# Patient Record
Sex: Male | Born: 1950
Health system: Southern US, Community
[De-identification: ages and names within clinical notes are randomized; demographics above are authoritative.]

## PROBLEM LIST (undated history)

## (undated) DIAGNOSIS — Z8639 Personal history of other endocrine, nutritional and metabolic disease: Secondary | ICD-10-CM

## (undated) DIAGNOSIS — K219 Gastro-esophageal reflux disease without esophagitis: Secondary | ICD-10-CM

## (undated) DIAGNOSIS — Z9289 Personal history of other medical treatment: Secondary | ICD-10-CM

## (undated) DIAGNOSIS — R238 Other skin changes: Secondary | ICD-10-CM

## (undated) DIAGNOSIS — E669 Obesity, unspecified: Secondary | ICD-10-CM

## (undated) DIAGNOSIS — G4733 Obstructive sleep apnea (adult) (pediatric): Secondary | ICD-10-CM

## (undated) DIAGNOSIS — M549 Dorsalgia, unspecified: Secondary | ICD-10-CM

## (undated) DIAGNOSIS — E119 Type 2 diabetes mellitus without complications: Secondary | ICD-10-CM

## (undated) DIAGNOSIS — R7303 Prediabetes: Secondary | ICD-10-CM

## (undated) DIAGNOSIS — N200 Calculus of kidney: Secondary | ICD-10-CM

## (undated) DIAGNOSIS — L853 Xerosis cutis: Secondary | ICD-10-CM

## (undated) DIAGNOSIS — I10 Essential (primary) hypertension: Secondary | ICD-10-CM

## (undated) DIAGNOSIS — E785 Hyperlipidemia, unspecified: Secondary | ICD-10-CM

## (undated) DIAGNOSIS — R233 Spontaneous ecchymoses: Secondary | ICD-10-CM

## (undated) DIAGNOSIS — R600 Localized edema: Secondary | ICD-10-CM

## (undated) DIAGNOSIS — R1310 Dysphagia, unspecified: Secondary | ICD-10-CM

## (undated) DIAGNOSIS — Z87442 Personal history of urinary calculi: Secondary | ICD-10-CM

## (undated) DIAGNOSIS — M2550 Pain in unspecified joint: Secondary | ICD-10-CM

## (undated) DIAGNOSIS — M5030 Other cervical disc degeneration, unspecified cervical region: Secondary | ICD-10-CM

## (undated) DIAGNOSIS — R0602 Shortness of breath: Secondary | ICD-10-CM

## (undated) DIAGNOSIS — J9811 Atelectasis: Secondary | ICD-10-CM

## (undated) DIAGNOSIS — M5020 Other cervical disc displacement, unspecified cervical region: Secondary | ICD-10-CM

## (undated) DIAGNOSIS — M189 Osteoarthritis of first carpometacarpal joint, unspecified: Secondary | ICD-10-CM

## (undated) HISTORY — DX: Obesity, unspecified: E66.9

## (undated) HISTORY — DX: Prediabetes: R73.03

## (undated) HISTORY — DX: Gastro-esophageal reflux disease without esophagitis: K21.9

## (undated) HISTORY — DX: Hyperlipidemia, unspecified: E78.5

## (undated) HISTORY — DX: Other cervical disc displacement, unspecified cervical region: M50.20

## (undated) HISTORY — DX: Dysphagia, unspecified: R13.10

## (undated) HISTORY — DX: Other cervical disc degeneration, unspecified cervical region: M50.30

## (undated) HISTORY — DX: Atelectasis: J98.11

## (undated) HISTORY — DX: Dorsalgia, unspecified: M54.9

## (undated) HISTORY — DX: Other skin changes: R23.8

## (undated) HISTORY — DX: Localized edema: R60.0

## (undated) HISTORY — DX: Personal history of other endocrine, nutritional and metabolic disease: Z86.39

## (undated) HISTORY — DX: Pain in unspecified joint: M25.50

## (undated) HISTORY — DX: Essential (primary) hypertension: I10

## (undated) HISTORY — DX: Personal history of other medical treatment: Z92.89

## (undated) HISTORY — DX: Osteoarthritis of first carpometacarpal joint, unspecified: M18.9

## (undated) HISTORY — DX: Obstructive sleep apnea (adult) (pediatric): G47.33

## (undated) HISTORY — DX: Shortness of breath: R06.02

---

## 1978-09-27 HISTORY — PX: ORIF ANKLE FRACTURE: SUR919

## 2000-05-13 ENCOUNTER — Encounter: Payer: Self-pay | Admitting: Family Medicine

## 2000-05-13 LAB — CONVERTED CEMR LAB: Blood Glucose, Fasting: 97 mg/dL

## 2000-05-23 DIAGNOSIS — Z9289 Personal history of other medical treatment: Secondary | ICD-10-CM

## 2000-05-23 HISTORY — DX: Personal history of other medical treatment: Z92.89

## 2000-07-18 ENCOUNTER — Ambulatory Visit (HOSPITAL_BASED_OUTPATIENT_CLINIC_OR_DEPARTMENT_OTHER): Admission: RE | Admit: 2000-07-18 | Discharge: 2000-07-18 | Payer: Self-pay | Admitting: Pulmonary Disease

## 2000-10-06 ENCOUNTER — Ambulatory Visit (HOSPITAL_COMMUNITY): Admission: RE | Admit: 2000-10-06 | Discharge: 2000-10-06 | Payer: Self-pay | Admitting: *Deleted

## 2003-09-28 DIAGNOSIS — E785 Hyperlipidemia, unspecified: Secondary | ICD-10-CM

## 2003-09-28 DIAGNOSIS — K219 Gastro-esophageal reflux disease without esophagitis: Secondary | ICD-10-CM

## 2003-09-28 HISTORY — PX: SEPTOPLASTY: SUR1290

## 2003-09-28 HISTORY — DX: Gastro-esophageal reflux disease without esophagitis: K21.9

## 2003-09-28 HISTORY — DX: Hyperlipidemia, unspecified: E78.5

## 2003-10-10 ENCOUNTER — Encounter: Payer: Self-pay | Admitting: Family Medicine

## 2003-10-10 LAB — CONVERTED CEMR LAB
Blood Glucose, Fasting: 110 mg/dL
PSA: 1.3 ng/mL
TSH: 1.64 microintl units/mL

## 2003-10-28 ENCOUNTER — Encounter: Admission: RE | Admit: 2003-10-28 | Discharge: 2003-10-28 | Payer: Self-pay | Admitting: Family Medicine

## 2004-06-27 DIAGNOSIS — I10 Essential (primary) hypertension: Secondary | ICD-10-CM

## 2004-06-27 HISTORY — DX: Essential (primary) hypertension: I10

## 2004-08-18 ENCOUNTER — Ambulatory Visit: Payer: Self-pay | Admitting: Pulmonary Disease

## 2004-10-13 ENCOUNTER — Ambulatory Visit: Payer: Self-pay | Admitting: Family Medicine

## 2004-10-13 LAB — CONVERTED CEMR LAB
Blood Glucose, Fasting: 115 mg/dL
PSA: 0.74 ng/mL

## 2004-10-16 ENCOUNTER — Ambulatory Visit: Payer: Self-pay | Admitting: Family Medicine

## 2004-11-12 ENCOUNTER — Ambulatory Visit: Payer: Self-pay | Admitting: Family Medicine

## 2005-08-13 ENCOUNTER — Ambulatory Visit: Payer: Self-pay | Admitting: Family Medicine

## 2005-12-07 ENCOUNTER — Ambulatory Visit: Payer: Self-pay | Admitting: Family Medicine

## 2005-12-07 LAB — CONVERTED CEMR LAB: Blood Glucose, Fasting: 115 mg/dL

## 2005-12-13 ENCOUNTER — Ambulatory Visit: Payer: Self-pay | Admitting: Family Medicine

## 2006-01-19 ENCOUNTER — Ambulatory Visit: Payer: Self-pay | Admitting: Family Medicine

## 2006-01-20 LAB — FECAL OCCULT BLOOD, GUAIAC: Fecal Occult Blood: NEGATIVE

## 2007-06-01 ENCOUNTER — Telehealth (INDEPENDENT_AMBULATORY_CARE_PROVIDER_SITE_OTHER): Payer: Self-pay | Admitting: *Deleted

## 2008-02-07 ENCOUNTER — Telehealth: Payer: Self-pay | Admitting: Family Medicine

## 2008-02-12 ENCOUNTER — Ambulatory Visit: Payer: Self-pay | Admitting: Family Medicine

## 2008-02-12 DIAGNOSIS — I10 Essential (primary) hypertension: Secondary | ICD-10-CM | POA: Insufficient documentation

## 2008-02-12 DIAGNOSIS — G4730 Sleep apnea, unspecified: Secondary | ICD-10-CM

## 2008-07-04 ENCOUNTER — Ambulatory Visit: Payer: Self-pay | Admitting: Family Medicine

## 2008-07-04 LAB — CONVERTED CEMR LAB
Alkaline Phosphatase: 61 units/L (ref 39–117)
Bilirubin, Direct: 0.1 mg/dL (ref 0.0–0.3)
Calcium: 9 mg/dL (ref 8.4–10.5)
GFR calc Af Amer: 129 mL/min
GFR calc non Af Amer: 106 mL/min
Glucose, Bld: 90 mg/dL (ref 70–99)
HDL: 41 mg/dL (ref >39.0)
LDL Cholesterol: 127 mg/dL — ABNORMAL HIGH (ref 0–99)
Potassium: 4.2 meq/L (ref 3.5–5.1)
Sodium: 139 meq/L (ref 135–145)
TSH: 1.34 microintl units/mL (ref 0.35–5.50)
Total Bilirubin: 1.2 mg/dL (ref 0.3–1.2)
Total CHOL/HDL Ratio: 4.4
VLDL: 13 mg/dL (ref 0–40)

## 2008-07-10 ENCOUNTER — Ambulatory Visit: Payer: Self-pay | Admitting: Family Medicine

## 2008-07-10 ENCOUNTER — Encounter: Payer: Self-pay | Admitting: Family Medicine

## 2008-07-10 DIAGNOSIS — E118 Type 2 diabetes mellitus with unspecified complications: Secondary | ICD-10-CM

## 2008-07-10 DIAGNOSIS — E1165 Type 2 diabetes mellitus with hyperglycemia: Secondary | ICD-10-CM

## 2008-07-10 DIAGNOSIS — E785 Hyperlipidemia, unspecified: Secondary | ICD-10-CM

## 2008-07-10 DIAGNOSIS — Z87442 Personal history of urinary calculi: Secondary | ICD-10-CM

## 2008-07-10 DIAGNOSIS — K219 Gastro-esophageal reflux disease without esophagitis: Secondary | ICD-10-CM

## 2008-07-10 HISTORY — DX: Gastro-esophageal reflux disease without esophagitis: K21.9

## 2009-02-17 ENCOUNTER — Encounter: Payer: Self-pay | Admitting: Family Medicine

## 2010-04-30 ENCOUNTER — Encounter (INDEPENDENT_AMBULATORY_CARE_PROVIDER_SITE_OTHER): Payer: Self-pay | Admitting: *Deleted

## 2010-10-27 NOTE — Letter (Signed)
Summary: Nadara Eaton letter  [place] at [facility]  [address] [place], [place] [address]   Phone: [phone]  Fax: [phone]       04/30/2010 MRN: [id number]  Cordel Zhang [address] [place], [place]  [address]  Dear Mr. Lawerance Bach,  [facility], and [facility] announce the retirement of Arta Silence, M.D., from full-time practice at the [facility] office effective March 26, 2010 and his plans of returning part-time.  It is important to Dr. Hetty Ely and to our practice that you understand that [facility] has seven physicians in our office for your health care needs.  We will continue to offer the same exceptional care that you have today.    Dr. Hetty Ely has spoken to many of you about his plans for retirement and returning part-time in the fall.   We will continue to work with you through the transition to schedule appointments for you in the office and meet the high standards that [facility] is committed to.   Again, it is with great pleasure that we share the news that Dr. Hetty Ely will return to [facility] at [facility] in October of 2011 with a reduced schedule.    If you have any questions, or would like to request an appointment with one of our physicians, please call us at [phone] and press the option for Scheduling an appointment.  We take pleasure in providing you with excellent patient care and look forward to seeing you at your next office visit.  Our [facility] Physicians are:  Tillman Abide, M.D. Laurita Quint, M.D. Roxy Manns, M.D. Kerby Nora, M.D. Hannah Beat, M.D. Ruthe Mannan, M.D. We proudly welcomed Raechel Ache, M.D. and Eustaquio Boyden, M.D. to the practice in July/August 2011.  Sincerely,  [facility]

## 2011-02-12 NOTE — Op Note (Signed)
[place]. [facility]  Patient:    Roger Mcdaniel, Roger Mcdaniel                        MRN: [id number] Proc. Date: 10/06/00 Adm. Date:  [id number] Attending:  Carlena Sax CC:         Barbaraann Share, M.D. [facility]  [facility]   Operative Report  PREOPERATIVE DIAGNOSIS:  Obstructive sleep apnea and nasal airway obstruction. Nasal septal deviation, inferior turbinate hypertrophy.  POSTOPERATIVE DIAGNOSIS:  Obstructive sleep apnea and nasal airway obstruction. Nasal septal deviation, inferior turbinate hypertrophy.  OPERATION PERFORMED:  Nasal septal reconstruction and intermural cauterization of both inferior turbinates.  SURGEON:  Veverly Fells. Arletha Grippe, M.D.  ANESTHESIA:  General endotracheal.  INDICATIONS FOR PROCEDURE:  The patient is a 60 year old white male with a long history of nasal airway obstruction, persistent mouth breathing, loud snoring at night with apneic episodes as documented by his family.  He does have a history of daytime somnolence and morning fatigue.  Inpatient polysomnogram did reveal mild obstructive sleep apnea with an RD of ____________ with desaturations down to 79%.  Physical examination did show a significant septal deviation to the right, congestion of the inferior turbinates bilaterally.  Based on his history and physical examination, I have recommended proceeding with the above noted surgical procedure.  I have discussed extensively with him the risks and benefits of the procedure including the risks of general anesthesia, infection, and bleeding and the normal recovery period after this type of surgery.  I have entertained any questions, answered them appropriately, informed consent has been obtained. The patient presents for the above noted procedure.  OPERATIVE FINDINGS:  Septal deviation, right inferior turbinate congestion, hypertrophy.  DESCRIPTION F PROCEDURE:  The patient was brought to the operating room and placed in the  supine position.  General endotracheal anesthesia was administered via the anesthesiologist without complication.  The patient was administered 1 gm Ancef IV x 1, 10 mg of Decadron IV x 1.  The head of the table was elevated to 30 degrees.  The patients face was draped in standard fashion.  Cotton pledgets soaked in a 4% cocaine solution and placed in both nasal chambers and left in place for approximately 5 to 10 minutes and then removed.  Both sides of the septum were infiltrated with 1% lidocaine solution and 1:100,000 epinephrine.  After waiting approximately 10 minutes, the standard Killian incision was made on the left side of the septum.  The mucoperichondrial and mucoperiosteal flap was elevated on the left side using both blunt and sharp dissection.  The intercartilaginous incision was made approximately 1 cm posterior from the caudal end of the septum.  The mucoperichondrial and mucoperiosteal flap was elevated on the right side using both blunt and sharp dissection.  Cartilaginous deviation was removed with a Ballenger swivel knive.  The posterior bony deflection was removed with an open Jansen-Middleton forceps and the septal spur which was pushing off the right nasal chamber was also removed using a Jansen-Middleton forceps without difficulty.  A piece of trimmed morselized cartilage was placed in between the septal flap, septal incision was closed with interrupted chromic suture and septum was reinforced with a running transeptal plain but mattress stitch. Both inferior turbinates were injected with a total of 6 cc of a 1% lidocaine solution with 1:100,000 epinephrine.  Both inferior turbinates were intramurally cauterized using the Elmed intramural cauterization unit set on a 12 watt setting. Three passes  of both inferior turbinates were performed without difficulty and both inferior turbinates were outfractured using gentle lateral pressure with a large nasal speculum.  Doyle  splint soaked in a Bactroban ointment solution placed on either side of the septum and held in place with a transeptal Prolene suture.  The orogastric tube was placed.  This was used to decompress the stomach contents.  This was then removed without incident.  Fluids given during the procedure were approximately 800 cc of crystalloid.  Estimated blood loss for this procedure was less than 30 cc. Urine output was not measured.  No drains.  The above noted two Doyle splints were placed.  Specimens sent were septal cartilage and bone for gross pathology only.  The patient tolerated the procedure well without complications, was extubated in the operating room and transferred to recovery room in stable condition.  Sponge, needle and instrument counts were correct at the end of the procedure.  Total duration of the procedure was approximately one and one half hours.  The patient will be admitted for overnight recovery.  Once he is recovered well, he will be sent home on October 07, 2000.  He will be sent home on Keflex 500 mg p.o. t.i.d. for 10 days and Vicodin #30 with refills, 1 to 2 tabs p.o. q.4h. p.r.n. pain.  He is to have light activity, no heavy lifting or nose blowing for two weeks after surgery.  Both he and his family were given oral and written instructions. They are to call for any signs of bleeding, fever, vomiting, pain or reaction to medications or any other questions.  He will follow up in the office for splint removal on Thursday January 17 at 2:25 p.m. DD:  10/06/00 TD:  10/06/00 Job: [id number]

## 2011-04-03 ENCOUNTER — Encounter: Payer: Self-pay | Admitting: Family Medicine

## 2011-04-20 ENCOUNTER — Other Ambulatory Visit: Payer: Self-pay | Admitting: Family Medicine

## 2011-04-20 DIAGNOSIS — R7309 Other abnormal glucose: Secondary | ICD-10-CM

## 2011-04-20 DIAGNOSIS — K219 Gastro-esophageal reflux disease without esophagitis: Secondary | ICD-10-CM

## 2011-04-20 DIAGNOSIS — I10 Essential (primary) hypertension: Secondary | ICD-10-CM

## 2011-04-20 DIAGNOSIS — E785 Hyperlipidemia, unspecified: Secondary | ICD-10-CM

## 2011-04-20 DIAGNOSIS — Z125 Encounter for screening for malignant neoplasm of prostate: Secondary | ICD-10-CM

## 2011-04-27 ENCOUNTER — Other Ambulatory Visit (INDEPENDENT_AMBULATORY_CARE_PROVIDER_SITE_OTHER): Payer: 59 | Admitting: Family Medicine

## 2011-04-27 ENCOUNTER — Telehealth: Payer: Self-pay | Admitting: *Deleted

## 2011-04-27 DIAGNOSIS — E785 Hyperlipidemia, unspecified: Secondary | ICD-10-CM

## 2011-04-27 DIAGNOSIS — K219 Gastro-esophageal reflux disease without esophagitis: Secondary | ICD-10-CM

## 2011-04-27 DIAGNOSIS — R7309 Other abnormal glucose: Secondary | ICD-10-CM

## 2011-04-27 DIAGNOSIS — Z125 Encounter for screening for malignant neoplasm of prostate: Secondary | ICD-10-CM

## 2011-04-27 LAB — HEPATIC FUNCTION PANEL
ALT: 23 U/L (ref 0–53)
AST: 22 U/L (ref 0–37)
Total Bilirubin: 1 mg/dL (ref 0.3–1.2)
Total Protein: 7 g/dL (ref 6.0–8.3)

## 2011-04-27 LAB — RENAL FUNCTION PANEL
BUN: 17 mg/dL (ref 6–23)
CO2: 28 mEq/L (ref 19–32)
Calcium: 9.1 mg/dL (ref 8.4–10.5)
Creatinine, Ser: 0.8 mg/dL (ref 0.4–1.5)
GFR: 102.26 mL/min (ref >60.00)
Glucose, Bld: 104 mg/dL — ABNORMAL HIGH (ref 70–99)
Sodium: 139 mEq/L (ref 135–145)

## 2011-04-27 LAB — LDL CHOLESTEROL, DIRECT: Direct LDL: 143.5 mg/dL

## 2011-04-27 LAB — LIPID PANEL
HDL: 42.3 mg/dL (ref >39.00)
Triglycerides: 63 mg/dL (ref 0.0–149.0)

## 2011-04-27 LAB — VITAMIN B12: Vitamin B-12: 440 pg/mL (ref 211–911)

## 2011-04-27 LAB — TSH: TSH: 2.36 u[IU]/mL (ref 0.35–5.50)

## 2011-04-27 LAB — PSA: PSA: 1.34 ng/mL (ref 0.10–4.00)

## 2011-04-27 NOTE — Telephone Encounter (Signed)
Roger Mcdaniel can this be added?

## 2011-04-27 NOTE — Telephone Encounter (Signed)
Patient came in this morning for labs and wanted to see a nurse about a bruise that he has on his arm. He says that his arm has been bruising very easily for the past several months. He says that he is not taking blood thinner, aspirin. He has cpx next week. I told him to bring it to your attention when he comes in.

## 2011-04-27 NOTE — Telephone Encounter (Signed)
Can Roger Mcdaniel add CBC to labs drawn with bruising as diagnosis, please?

## 2011-04-28 ENCOUNTER — Telehealth: Payer: Self-pay | Admitting: Family Medicine

## 2011-04-28 ENCOUNTER — Other Ambulatory Visit: Payer: Self-pay | Admitting: Family Medicine

## 2011-04-28 DIAGNOSIS — S5010XA Contusion of unspecified forearm, initial encounter: Secondary | ICD-10-CM

## 2011-04-28 NOTE — Telephone Encounter (Signed)
Left message on voice mail  to call back

## 2011-04-28 NOTE — Telephone Encounter (Signed)
Message copied by Arta Silence on Wed Apr 28, 2011  7:26 AM ------      Message from: Alvina Chou      Created: Tue Apr 27, 2011  5:16 PM       Cannot add CBC, wrong specimen type. Sorry , Camelia Eng

## 2011-04-28 NOTE — Telephone Encounter (Signed)
Patient notified and lab appointment scheduled. 

## 2011-04-28 NOTE — Telephone Encounter (Signed)
Rene Kocher, Would you pls have this pt come in for CBC prior to seeing him next week. He complains of easy bruising. Pls use code 923.10

## 2011-05-03 ENCOUNTER — Other Ambulatory Visit (INDEPENDENT_AMBULATORY_CARE_PROVIDER_SITE_OTHER): Payer: 59 | Admitting: Family Medicine

## 2011-05-03 DIAGNOSIS — S5010XA Contusion of unspecified forearm, initial encounter: Secondary | ICD-10-CM

## 2011-05-03 LAB — CBC WITH DIFFERENTIAL/PLATELET
Basophils Relative: 0.6 % (ref 0.0–3.0)
HCT: 45.2 % (ref 39.0–52.0)
Hemoglobin: 15.2 g/dL (ref 13.0–17.0)
Lymphocytes Relative: 29.7 % (ref 12.0–46.0)
Lymphs Abs: 1.8 10*3/uL (ref 0.7–4.0)
Monocytes Relative: 10.6 % (ref 3.0–12.0)
Neutro Abs: 3.5 10*3/uL (ref 1.4–7.7)
RBC: 4.9 Mil/uL (ref 4.22–5.81)

## 2011-05-05 ENCOUNTER — Ambulatory Visit (INDEPENDENT_AMBULATORY_CARE_PROVIDER_SITE_OTHER): Payer: 59 | Admitting: Family Medicine

## 2011-05-05 ENCOUNTER — Encounter: Payer: Self-pay | Admitting: Family Medicine

## 2011-05-05 VITALS — BP 118/70 | HR 68 | Temp 98.4°F | Ht 64.75 in | Wt 237.2 lb

## 2011-05-05 DIAGNOSIS — Z23 Encounter for immunization: Secondary | ICD-10-CM

## 2011-05-05 DIAGNOSIS — I10 Essential (primary) hypertension: Secondary | ICD-10-CM

## 2011-05-05 DIAGNOSIS — E785 Hyperlipidemia, unspecified: Secondary | ICD-10-CM

## 2011-05-05 DIAGNOSIS — R7309 Other abnormal glucose: Secondary | ICD-10-CM

## 2011-05-05 DIAGNOSIS — K219 Gastro-esophageal reflux disease without esophagitis: Secondary | ICD-10-CM

## 2011-05-05 MED ORDER — PRAVASTATIN SODIUM 40 MG PO TABS: 40.0000 mg | ORAL_TABLET | Freq: Every evening | ORAL | Status: DC

## 2011-05-05 NOTE — Assessment & Plan Note (Addendum)
Good control. Cont curr lifestyle. On no meds. BP Readings from Last 3 Encounters:  05/05/11 118/70  07/10/08 120/60  01/30/99 130/86

## 2011-05-05 NOTE — Patient Instructions (Signed)
SGOT, SGPT 272.4    6 WEEKS See me in 3 mos, SGOT, SGPT, CHOL PROFILE  272.4     prior

## 2011-05-05 NOTE — Assessment & Plan Note (Signed)
All good except LDL high. Will start on Prava 40mg  and reassess in 3 months. Lab Results  Component Value Date   CHOL 201* 04/27/2011   CHOL 181 07/04/2008   Lab Results  Component Value Date   HDL 42.30 04/27/2011   HDL 78.2 07/04/2008   Lab Results  Component Value Date   LDLCALC 127* 07/04/2008   Lab Results  Component Value Date   TRIG 63.0 04/27/2011   TRIG 63 07/04/2008   Lab Results  Component Value Date   CHOLHDL 5 04/27/2011   CHOLHDL 4.4 CALC 07/04/2008   Lab Results  Component Value Date   LDLDIRECT 143.5 04/27/2011

## 2011-05-05 NOTE — Assessment & Plan Note (Addendum)
Still mildly elevated. Watch sweets and carbs. Get LDL lower. Microalb nml.

## 2011-05-05 NOTE — Progress Notes (Signed)
  Subjective:    Patient ID: Roger Mcdaniel, male    DOB: 27-Aug-1951, 60 y.o.   MRN: [id number]  HPI Pt here for Comp Exam. He had a significant bruise last week when blood was drawn and I did a CBC to check. He hit something prior. He has chronic wrist pain and takes Naprosyn 2 a day. He was cautioned to take with food. He also takes vinegar and honey and grapefruit juice three times a day also. We discussed poss interactions of the juice.     Review of Systems  Constitutional: Negative for fever, chills, diaphoresis, appetite change, fatigue and unexpected weight change.  HENT: Negative for hearing loss, ear pain, tinnitus and ear discharge.   Eyes: Negative for pain, discharge and visual disturbance.  Respiratory: Negative for cough, shortness of breath and wheezing.        He is no longer on CPAP.  Cardiovascular: Negative for chest pain and palpitations.       No SOB w/ exertion  Gastrointestinal: Negative for nausea, vomiting, abdominal pain, diarrhea, constipation and blood in stool.       No heartburn or swallowing problems.  Genitourinary: Negative for dysuria, frequency and difficulty urinating.       Has nocturia once a night.   Musculoskeletal: Positive for arthralgias (wrist pain bilat, chronically.). Negative for myalgias and back pain.  Skin: Negative for rash.       No itching or dryness.  Neurological: Negative for tremors and numbness.       No tingling or balance problems. Mild numbness of left lateral thigh.  Hematological: Negative for adenopathy. Does not bruise/bleed easily.       Has bruising with forearm trauma.  Psychiatric/Behavioral: Negative for dysphoric mood and agitation.       Objective:   Physical Exam  Constitutional: He is oriented to person, place, and time. He appears well-developed and well-nourished. No distress.  HENT:  Head: Normocephalic and atraumatic.  Right Ear: External ear normal.  Left Ear: External ear normal.  Nose: Nose normal.    Mouth/Throat: Oropharynx is clear and moist.  Eyes: Conjunctivae and EOM are normal. Pupils are equal, round, and reactive to light. Right eye exhibits no discharge. Left eye exhibits no discharge. No scleral icterus.  Neck: Normal range of motion. Neck supple. No thyromegaly present.  Cardiovascular: Normal rate, regular rhythm, normal heart sounds and intact distal pulses.   No murmur heard. Pulmonary/Chest: Effort normal and breath sounds normal. No respiratory distress. He has no wheezes.  Abdominal: Soft. Bowel sounds are normal. He exhibits no distension and no mass. There is no tenderness. There is no rebound and no guarding.  Genitourinary: Rectum normal, prostate normal and penis normal. Guaiac negative stool.       Prostate 20-30gms, smooth, symmetric and firm, raphe intact  Musculoskeletal: Normal range of motion. He exhibits no edema.  Lymphadenopathy:    He has no cervical adenopathy.  Neurological: He is alert and oriented to person, place, and time. Coordination normal.  Skin: Skin is warm and dry. No rash noted. He is not diaphoretic.  Psychiatric: He has a normal mood and affect. His behavior is normal. Judgment and thought content normal.          Assessment & Plan:  HMPE  Tdap today.

## 2011-05-05 NOTE — Assessment & Plan Note (Signed)
Stable and well controlled.. B12 ok.

## 2011-05-19 ENCOUNTER — Other Ambulatory Visit: Payer: 59

## 2011-05-19 ENCOUNTER — Other Ambulatory Visit: Payer: Self-pay | Admitting: Family Medicine

## 2011-05-19 DIAGNOSIS — Z0000 Encounter for general adult medical examination without abnormal findings: Secondary | ICD-10-CM

## 2011-05-20 ENCOUNTER — Encounter: Payer: Self-pay | Admitting: *Deleted

## 2011-05-20 ENCOUNTER — Encounter: Payer: Self-pay | Admitting: Family Medicine

## 2011-06-14 ENCOUNTER — Other Ambulatory Visit (INDEPENDENT_AMBULATORY_CARE_PROVIDER_SITE_OTHER): Payer: 59

## 2011-06-14 DIAGNOSIS — E7800 Pure hypercholesterolemia, unspecified: Secondary | ICD-10-CM

## 2011-06-14 LAB — AST: AST: 23 U/L (ref 0–37)

## 2011-06-14 LAB — ALT: ALT: 22 U/L (ref 0–53)

## 2011-07-22 ENCOUNTER — Other Ambulatory Visit: Payer: Self-pay | Admitting: Family Medicine

## 2011-07-22 DIAGNOSIS — E785 Hyperlipidemia, unspecified: Secondary | ICD-10-CM

## 2011-07-26 ENCOUNTER — Other Ambulatory Visit (INDEPENDENT_AMBULATORY_CARE_PROVIDER_SITE_OTHER): Payer: 59

## 2011-07-26 DIAGNOSIS — E785 Hyperlipidemia, unspecified: Secondary | ICD-10-CM

## 2011-07-26 LAB — LIPID PANEL
Cholesterol: 125 mg/dL (ref 0–200)
HDL: 48 mg/dL (ref >39.00)
Triglycerides: 41 mg/dL (ref 0.0–149.0)
VLDL: 8.2 mg/dL (ref 0.0–40.0)

## 2011-07-26 LAB — HEPATIC FUNCTION PANEL
ALT: 25 U/L (ref 0–53)
AST: 26 U/L (ref 0–37)
Albumin: 4.2 g/dL (ref 3.5–5.2)
Total Bilirubin: 0.6 mg/dL (ref 0.3–1.2)
Total Protein: 7 g/dL (ref 6.0–8.3)

## 2011-07-28 ENCOUNTER — Ambulatory Visit (INDEPENDENT_AMBULATORY_CARE_PROVIDER_SITE_OTHER): Payer: 59 | Admitting: Family Medicine

## 2011-07-28 ENCOUNTER — Encounter: Payer: Self-pay | Admitting: Family Medicine

## 2011-07-28 DIAGNOSIS — E785 Hyperlipidemia, unspecified: Secondary | ICD-10-CM

## 2011-07-28 DIAGNOSIS — I10 Essential (primary) hypertension: Secondary | ICD-10-CM

## 2011-07-28 NOTE — Progress Notes (Signed)
  Subjective:    Patient ID: Roger Mcdaniel, male    DOB: 23-Oct-1950, 60 y.o.   MRN: [id number]  HPI Pt here for three month followup after starting Pravachol 40 mg for better chol control.  His BP is high today and he has not missed any medications. He denies changes in diet, spcifically salt intake.  He used to be on beta blocker in the past but was taken off after he had lost a fair amount of weight. He is again trying to lose weight.   Review of Systems  Constitutional: Negative for fever, chills, diaphoresis, activity change, appetite change and fatigue.  HENT: Negative for hearing loss, ear pain, congestion, sore throat, rhinorrhea, neck pain, neck stiffness, postnasal drip, sinus pressure, tinnitus and ear discharge.   Eyes: Negative for pain, discharge and visual disturbance.  Respiratory: Negative for cough, shortness of breath and wheezing.   Cardiovascular: Negative for chest pain and palpitations.       No SOB w/ exertion  Gastrointestinal:       No heartburn or swallowing problems.  Genitourinary:       No nocturia  Skin:       No itching or dryness.  Neurological:       No tingling or balance problems.  All other systems reviewed and are negative.       Objective:   Physical Exam  Constitutional: He appears well-developed and well-nourished. No distress.  HENT:  Head: Normocephalic and atraumatic.  Right Ear: External ear normal.  Left Ear: External ear normal.  Nose: Nose normal.  Mouth/Throat: Oropharynx is clear and moist.  Eyes: Conjunctivae and EOM are normal. Pupils are equal, round, and reactive to light. Right eye exhibits no discharge. Left eye exhibits no discharge.  Neck: Normal range of motion. Neck supple.  Cardiovascular: Normal rate and regular rhythm.   Pulmonary/Chest: Effort normal and breath sounds normal. He has no wheezes.  Lymphadenopathy:    He has no cervical adenopathy.  Skin: He is not diaphoretic.          Assessment & Plan:

## 2011-07-28 NOTE — Assessment & Plan Note (Signed)
Great response to Pravachol 40, cont. Nos now very low risk for CAD. Lab Results  Component Value Date   CHOL 125 07/26/2011   HDL 48.00 07/26/2011   LDLCALC 69 07/26/2011   LDLDIRECT 143.5 04/27/2011   TRIG 41.0 07/26/2011   CHOLHDL 3 07/26/2011

## 2011-07-28 NOTE — Assessment & Plan Note (Signed)
High today. Will bnot change anything as nos have been good historically but see him back in Dec for recheck. Check at home as well. BP Readings from Last 3 Encounters:  07/28/11 158/78  05/05/11 118/70  07/10/08 120/60

## 2011-07-28 NOTE — Patient Instructions (Signed)
RTC just before Christmas for BP recheck.

## 2011-08-05 ENCOUNTER — Ambulatory Visit: Payer: 59 | Admitting: Family Medicine

## 2011-08-17 ENCOUNTER — Ambulatory Visit: Payer: 59 | Admitting: Family Medicine

## 2011-09-07 ENCOUNTER — Ambulatory Visit (INDEPENDENT_AMBULATORY_CARE_PROVIDER_SITE_OTHER): Payer: 59 | Admitting: Family Medicine

## 2011-09-07 ENCOUNTER — Encounter: Payer: Self-pay | Admitting: Family Medicine

## 2011-09-07 DIAGNOSIS — R030 Elevated blood-pressure reading, without diagnosis of hypertension: Secondary | ICD-10-CM | POA: Insufficient documentation

## 2011-09-07 DIAGNOSIS — R079 Chest pain, unspecified: Secondary | ICD-10-CM | POA: Insufficient documentation

## 2011-09-07 DIAGNOSIS — E785 Hyperlipidemia, unspecified: Secondary | ICD-10-CM

## 2011-09-07 MED ORDER — ACETAMINOPHEN 500 MG PO TABS: 1000.0000 mg | ORAL_TABLET | Freq: Three times a day (TID) | ORAL | Status: AC | PRN

## 2011-09-07 MED ORDER — LISINOPRIL 10 MG PO TABS: 10.0000 mg | ORAL_TABLET | Freq: Every day | ORAL | Status: DC

## 2011-09-07 MED ORDER — ASPIRIN EC 81 MG PO TBEC: 81.0000 mg | DELAYED_RELEASE_TABLET | Freq: Every day | ORAL | Status: AC

## 2011-09-07 NOTE — Assessment & Plan Note (Addendum)
Reviewed recent blood work.  EKG today. New elevated bp.  Anticipate weight gain and ibuprofen use contributing. Stop ibuprofen, change to tylenol. Start lisinopril 10mg  daily.  rtc 2 wks for blood work recheck. BP Readings from Last 3 Encounters:  09/07/11 160/80  07/28/11 158/78  05/05/11 118/70

## 2011-09-07 NOTE — Patient Instructions (Addendum)
Stop ibuprofen for now.  Use tylenol extra strength (500mg ) 1-2 pills three times a day as needed for pain. Start baby aspirin (enteric coated). Start lisinopril 10mg  daily for better blood pressure control. Return in 2 weeks for blood work to check kidney function on new medicine. Keep follow up appointment with Dr. Hetty Ely. If any return of chest pain/pressure, please seek urgent care. Pass by [facility] to set up cardiology referral

## 2011-09-07 NOTE — Assessment & Plan Note (Addendum)
Atypical sxs however risk factors present - fmhx, obesity, now HTN. Last ETT 2001 normal. EKG today - sinus brady 58, low voltage V1 and V2.  Normal axis, intervals, no hypertrophy.  No acute ST/T changes apparent. rec start ASA 81mg  daily, refer to cards for further eval.

## 2011-09-07 NOTE — Progress Notes (Signed)
Subjective:    Patient ID: Roger Mcdaniel, male    DOB: 02-07-1951, 60 y.o.   MRN: [id number]  HPI CC: elevated BP  Pleasant 60 yo with h/o HLD presents today with concern because of elevated bp at home readings this morning - 189/86, pulse 63.  Feeling achey as well.  H/o HTN and OSA and GERD in past, lost 90 lbs and able to stop CPAP, atenolol.  Recently gained back 20 lbs.    Endorses chest pain last night described as steady ache/heaviness, left substernal, no pressure/tightness.  Lasted 1 hour, came on at rest, not exhertional, not relieved by rest.  fmhx CAD - father deceased from MI, onset age 29, grandfather as well.  Not associated with SOB, nausea.  No radiation of pain.  No GERD, heartburn sxs.  Not food related.  Has not recently eaten spicy foods.  Endorses HA starting this morning, mild R temporal steady ache, attributing to elevated bp.  Endorses mild leg swelling at end of day, quickly resolves.  No fevers/chills, vision changes, SOB.  Taking 400mg  ibuprofen bid regularly for last few weeks (recent change from naprosyn).  GERD well controlled off meds.  Lab Results  Component Value Date   LDLCALC 69 07/26/2011   Lab Results  Component Value Date   CREATININE 0.8 04/27/2011   Medications and allergies reviewed and updated in chart. Past histories reviewed and updated if relevant as below. Patient Active Problem List  Diagnoses  . HYPERLIPIDEMIA  . ESSENTIAL HYPERTENSION, BENIGN  . GERD  . SLEEP APNEA  . HYPERGLYCEMIA  . NEPHROLITHIASIS, HX OF   Past Medical History  Diagnosis Date  . GERD (gastroesophageal reflux disease) 09/2003  . Hyperlipidemia 09/2003  . Hypertension 06/2004  . History of ETT 05/23/2000    Nml (Dr. Melburn Popper)   Past Surgical History  Procedure Date  . Orif ankle fracture 1980    right  . Septoplasty 2005    for sleep apnea //cpap   History  Substance Use Topics  . Smoking status: Never Smoker   . Smokeless tobacco: Never Used  .  Alcohol Use: 0.5 oz/week    1 drink(s) per week     rarely   Family History  Problem Relation Age of Onset  . Heart disease Father     MI x 2  . Hypertension Brother   . Obesity Brother   . Heart disease Paternal Grandfather      MI  . Cancer Other    Allergies  Allergen Reactions  . Codeine Sulfate     REACTION: nausea   Current Outpatient Prescriptions on File Prior to Visit  Medication Sig Dispense Refill  . B Complex-C-Folic Acid (STRESS B COMPLEX PO) Take by mouth daily.        . Omega-3 Fatty Acids (FISH OIL) 1000 MG CAPS Take by mouth 2 (two) times daily.       . pravastatin (PRAVACHOL) 40 MG tablet Take 1 tablet (40 mg total) by mouth every evening.  30 tablet  11  . Zinc Gluconate (MEGA ZINC-100) 100 MG TBCR Take by mouth daily.         Review of Systems Per HPI    Objective:   Physical Exam  Nursing note and vitals reviewed. Constitutional: He appears well-developed and well-nourished. No distress.  HENT:  Head: Normocephalic.  Mouth/Throat: Oropharynx is clear and moist. No oropharyngeal exudate.  Eyes: Conjunctivae and EOM are normal. Pupils are equal, round, and reactive to light. No  scleral icterus.  Neck: Normal range of motion. Neck supple. Carotid bruit is not present.  Cardiovascular: Normal rate, regular rhythm, normal heart sounds and intact distal pulses.   No murmur heard. Pulmonary/Chest: Effort normal and breath sounds normal. No respiratory distress. He has no wheezes. He has no rales.  Musculoskeletal: He exhibits no edema.  Lymphadenopathy:    He has no cervical adenopathy.  Skin: Skin is warm and dry. No rash noted.  Psychiatric: He has a normal mood and affect.       Assessment & Plan:

## 2011-09-07 NOTE — Assessment & Plan Note (Addendum)
Good control on pravastatin as of last LDL (06/2011) continue. Wt Readings from Last 3 Encounters:  09/07/11 242 lb 4 oz (109.884 kg)  07/28/11 237 lb 4 oz (107.616 kg)  05/05/11 237 lb 4 oz (107.616 kg)

## 2011-09-16 ENCOUNTER — Ambulatory Visit (INDEPENDENT_AMBULATORY_CARE_PROVIDER_SITE_OTHER): Payer: 59 | Admitting: Family Medicine

## 2011-09-16 ENCOUNTER — Encounter: Payer: Self-pay | Admitting: Family Medicine

## 2011-09-16 DIAGNOSIS — R030 Elevated blood-pressure reading, without diagnosis of hypertension: Secondary | ICD-10-CM

## 2011-09-16 DIAGNOSIS — I10 Essential (primary) hypertension: Secondary | ICD-10-CM

## 2011-09-16 LAB — BASIC METABOLIC PANEL
BUN: 15 mg/dL (ref 6–23)
Creatinine, Ser: 0.8 mg/dL (ref 0.4–1.5)
GFR: 105.07 mL/min (ref >60.00)
Glucose, Bld: 107 mg/dL — ABNORMAL HIGH (ref 70–99)
Potassium: 4.9 mEq/L (ref 3.5–5.1)

## 2011-09-16 LAB — TSH: TSH: 1.39 u[IU]/mL (ref 0.35–5.50)

## 2011-09-16 NOTE — Assessment & Plan Note (Signed)
Increase Lisinopril to bid, check Bmet and TSH this AM. BP by me 160/85, c/w his machine at home lately including this AM. BP Readings from Last 3 Encounters:  09/16/11 128/68  09/07/11 160/80  07/28/11 158/78

## 2011-09-16 NOTE — Progress Notes (Signed)
  Subjective:    Patient ID: Roger Mcdaniel, male    DOB: 1951/02/18, 60 y.o.   MRN: [id number]  HPIPt here for 2 week recheck of BP after being seen by Dr Reece Agar and being started on Lisinopril 10mg . He was to have had labs done but did not. He is tolerating his medication fine. He had been having headaches and currently has one.[name] He takes the medication in the AM, had it about 2 hrs ago.     Review of Systems  Constitutional: Negative for fever, chills, diaphoresis, activity change, appetite change and fatigue.  HENT: Negative for hearing loss, ear pain, congestion, sore throat, rhinorrhea, neck pain, neck stiffness, postnasal drip, sinus pressure, tinnitus and ear discharge.   Eyes: Negative for pain, discharge and visual disturbance.  Respiratory: Negative for cough, shortness of breath and wheezing.   Cardiovascular: Negative for chest pain and palpitations.       No SOB w/ exertion  Gastrointestinal:       No heartburn or swallowing problems.  Genitourinary:       No nocturia  Skin:       No itching or dryness.  Neurological:       No tingling or balance problems.  All other systems reviewed and are negative.       Objective:   Physical Exam  Constitutional: He appears well-developed and well-nourished. No distress.  HENT:  Head: Normocephalic and atraumatic.  Right Ear: External ear normal.  Left Ear: External ear normal.  Nose: Nose normal.  Mouth/Throat: Oropharynx is clear and moist.  Eyes: Conjunctivae and EOM are normal. Pupils are equal, round, and reactive to light. Right eye exhibits no discharge. Left eye exhibits no discharge.  Neck: Normal range of motion. Neck supple.  Cardiovascular: Normal rate and regular rhythm.   Pulmonary/Chest: Effort normal and breath sounds normal. He has no wheezes.  Lymphadenopathy:    He has no cervical adenopathy.  Skin: He is not diaphoretic.          Assessment & Plan:  r

## 2011-09-16 NOTE — Patient Instructions (Signed)
RTC one week for BP recheck Labs this AM. Increase Lisinopril to twice a day.

## 2011-09-23 ENCOUNTER — Ambulatory Visit (INDEPENDENT_AMBULATORY_CARE_PROVIDER_SITE_OTHER): Payer: 59 | Admitting: Family Medicine

## 2011-09-23 ENCOUNTER — Encounter: Payer: Self-pay | Admitting: Family Medicine

## 2011-09-23 DIAGNOSIS — I10 Essential (primary) hypertension: Secondary | ICD-10-CM

## 2011-09-23 MED ORDER — LISINOPRIL 10 MG PO TABS: 10.0000 mg | ORAL_TABLET | Freq: Every day | ORAL | Status: DC

## 2011-09-23 MED ORDER — DIPHENOXYLATE-ATROPINE 2.5-0.025 MG PO TABS: 1.0000 | ORAL_TABLET | Freq: Four times a day (QID) | ORAL | Status: DC | PRN

## 2011-09-23 MED ORDER — AMLODIPINE BESYLATE 5 MG PO TABS: 5.0000 mg | ORAL_TABLET | Freq: Every day | ORAL | Status: DC

## 2011-09-23 NOTE — Progress Notes (Signed)
  Subjective:    Patient ID: Roger Mcdaniel, male    DOB: 04/26/1951, 60 y.o.   MRN: [id number]  HPI Pt here for two week followup from increasing medication for HBP. He is now on Lisinopril 10mg  bid.  He saw no difference on twice a day, so stopped the bid dose two days ago.  He feels well and has no complaints.     Review of Systems  Constitutional: Negative for fever, chills, diaphoresis, activity change, appetite change and fatigue.  HENT: Negative for hearing loss, ear pain, congestion, sore throat, rhinorrhea, neck pain, neck stiffness, postnasal drip, sinus pressure, tinnitus and ear discharge.   Eyes: Negative for pain, discharge and visual disturbance.  Respiratory: Negative for cough, shortness of breath and wheezing.   Cardiovascular: Negative for chest pain and palpitations.       No SOB w/ exertion  Gastrointestinal:       No heartburn or swallowing problems.  Genitourinary: Negative for penile swelling.       No nocturia  Skin:       No itching or dryness.  Neurological:       No tingling or balance problems.  All other systems reviewed and are negative.       Objective:   Physical Exam  Constitutional: He appears well-developed and well-nourished. No distress.  HENT:  Head: Normocephalic and atraumatic.  Right Ear: External ear normal.  Left Ear: External ear normal.  Nose: Nose normal.  Mouth/Throat: Oropharynx is clear and moist.  Eyes: Conjunctivae and EOM are normal. Pupils are equal, round, and reactive to light. Right eye exhibits no discharge. Left eye exhibits no discharge.  Neck: Normal range of motion. Neck supple.  Cardiovascular: Normal rate and regular rhythm.   Pulmonary/Chest: Effort normal and breath sounds normal. He has no wheezes.  Lymphadenopathy:    He has no cervical adenopathy.  Skin: He is not diaphoretic.          Assessment & Plan:

## 2011-09-23 NOTE — Patient Instructions (Addendum)
RTC 3 mos with Dr Reece Agar for BP recheck.

## 2011-09-23 NOTE — Assessment & Plan Note (Signed)
No real improvement in two weeks on Lisinopril 10mg  bid.  Will have him stay off the second dose of Lisinopril, which he stopped two days ago, and add Amlodipine 5mg  at night (If Cardiology does not disagree tomorrow) and take the Lisinopril in the AM. BP Readings from Last 3 Encounters:  09/23/11 154/86  09/16/11 128/68  09/07/11 160/80   BP 12/20 12 by me 160s/80s.

## 2011-09-23 NOTE — Progress Notes (Signed)
Addended by: Arta Silence on: 09/23/2011 04:25 PM   Modules accepted: Orders

## 2011-09-24 ENCOUNTER — Ambulatory Visit (INDEPENDENT_AMBULATORY_CARE_PROVIDER_SITE_OTHER): Payer: 59 | Admitting: Cardiovascular Disease

## 2011-09-24 ENCOUNTER — Encounter: Payer: Self-pay | Admitting: Cardiovascular Disease

## 2011-09-24 ENCOUNTER — Other Ambulatory Visit: Payer: 59

## 2011-09-24 VITALS — BP 170/80 | HR 71 | Ht 64.0 in | Wt 240.8 lb

## 2011-09-24 DIAGNOSIS — I10 Essential (primary) hypertension: Secondary | ICD-10-CM

## 2011-09-24 DIAGNOSIS — R079 Chest pain, unspecified: Secondary | ICD-10-CM

## 2011-09-24 MED ORDER — CARVEDILOL 6.25 MG PO TABS: 6.2500 mg | ORAL_TABLET | Freq: Two times a day (BID) | ORAL | Status: DC

## 2011-09-24 NOTE — Assessment & Plan Note (Signed)
He had one episode of chest pain that occurred while driving. It has not recurred and it was fairly atypical. It lasted for about 12 hours and he did not have any EKG changes. I suspect it was noncardiac.  I've asked him to exercise on a regular basis. If he has any further episodes of chest pain, I would have a very low threshold to perform a stress Myoview study.  I'll see him again in several months and we'll followup with this at that time.

## 2011-09-24 NOTE — Progress Notes (Signed)
Roger Mcdaniel Date of Birth  [birth year]  [facility]       [place] Office [address]    [address]     [address] [place], [place]  [address]      [place], [place]  [address] [phone]  Fax  [phone]    [phone]   Fax [phone]   History of Present Illness:  Roger Mcdaniel is a 60 yo gentleman with a hx of HTN, sleep apnea,  obesity and a recent episode of chest pan.    He has a hx of obesity and HTN.  He lost about 90 lbs and was able to stop his BP meds.  Recently he has regained 25 pounds and has developed HTN again.  He had an episode of left  chest pain while driving home from work in [place].  The pain lasted about 12 hours.  He saw the doctor the following day and his work up was normal.  He does not exercise regularly.   He's not had any recurrent episodes of chest pain since that time. He's getting back into exercising and wants to work out on his treadmill regularly.  He was seen by his medical doctor yesterday. He was given a prescription for Norvasc but he did not start the medication yet.   Current Outpatient Prescriptions on File Prior to Visit  Medication Sig Dispense Refill  . acetaminophen (TYLENOL) 500 MG tablet Take 2 tablets (1,000 mg total) by mouth 3 (three) times daily as needed for pain.      [name] amLODipine (NORVASC) 5 MG tablet Take 1 tablet (5 mg total) by mouth daily.  30 tablet  12  . aspirin EC 81 MG tablet Take 1 tablet (81 mg total) by mouth daily.      . B Complex-C-Folic Acid (STRESS B COMPLEX PO) Take by mouth daily.        [name] ibuprofen (ADVIL,MOTRIN) 200 MG tablet Take 400 mg by mouth 2 (two) times daily.        [name] lisinopril (PRINIVIL,ZESTRIL) 10 MG tablet Take 1 tablet (10 mg total) by mouth daily. Take 2 by mouth daily  30 tablet  12  . Omega-3 Fatty Acids (FISH OIL) 1000 MG CAPS Take by mouth 2 (two) times daily.       . pravastatin (PRAVACHOL) 40 MG tablet Take 1 tablet (40 mg total) by mouth every evening.  30 tablet  11    . Zinc Gluconate (MEGA ZINC-100) 100 MG TBCR Take by mouth daily.        He has not started his Amlodipine yet .  Allergies  Allergen Reactions  . Codeine Sulfate     REACTION: nausea    Past Medical History  Diagnosis Date  . GERD (gastroesophageal reflux disease) 09/2003  . Hyperlipidemia 09/2003  . Hypertension 06/2004  . History of ETT 05/23/2000    Nml (Dr. Melburn Popper)  . Chest pain     Past Surgical History  Procedure Date  . Orif ankle fracture 1980    right  . Septoplasty 2005    for sleep apnea //cpap    History  Smoking status  . Never Smoker   Smokeless tobacco  . Never Used    History  Alcohol Use  . 0.5 oz/week  . 1 drink(s) per week    rarely    Family History  Problem Relation Age of Onset  . Heart disease Father     MI x 2  .  Hypertension Brother   . Obesity Brother   . Heart disease Paternal Grandfather      MI  . Cancer Other     Reviw of Systems:  Reviewed in the HPI.  All other systems are negative.  Physical Exam: BP 170/80  Pulse 71  Ht 5\' 4"  (1.626 m)  Wt 240 lb 12.8 oz (109.226 kg)  BMI 41.33 kg/m2  SpO2 98% The patient is alert and oriented x 3.  The mood and affect are normal.   Skin: warm and dry.  Color is normal.    HEENT:   Cephalic/atraumatic. His rigidity. His carotids are normal. His neck is supple.  Lungs: Lungs are clear.   Heart: Regular rate S1-S2. He has no murmurs gallops or rubs.    Abdomen: He is moderately obese. He is no hepatosplenomegaly. There is no bruits.  Extremities:  No clubbing cyanosis or edema.  Neuro:  Exam is nonfocal. His gait is normal.    ECG: His EKG performed at his medical doctors office was normal.  Assessment / Plan:

## 2011-09-24 NOTE — Assessment & Plan Note (Signed)
His blood pressure remained fairly elevated today. We'll continue with the lisinopril and his current dose of 10 mg a day. I might add carvedilol 6.25 mg twice a day. If this does not adequately control his blood pressure, we may try him on Bystolic.  He'll continue to monitor his blood pressure on right basis.  He'll call us back in 3-4 weeks. If his blood pressure remains elevated, we'll have him start the Norvasc. He already has a prescription for Norvasc 5 mg a day.  I suspect his blood pressure will improve as he exercises and loses weight.

## 2011-09-24 NOTE — Patient Instructions (Addendum)
Your physician has recommended you make the following change in your medication: START taking Carvedilol (Coreg) 6.25mg  TWICE daily.   Avoid Sea Salt.  Your physician has requested that you have an echocardiogram. Echocardiography is a painless test that uses sound waves to create images of your heart. It provides your doctor with information about the size and shape of your heart and how well your heart's chambers and valves are working. This procedure takes approximately one hour. There are no restrictions for this procedure.  Your physician has requested that you regularly monitor and record your blood pressure readings at home. Please use the same machine at the same time of day to check your readings and record them to bring to your follow-up visit.Call the office with your BP readings in 2 weeks.  Your physician recommends that you schedule a follow-up appointment in: 1-2 months with Dr. Elease Hashimoto

## 2011-10-15 ENCOUNTER — Other Ambulatory Visit (INDEPENDENT_AMBULATORY_CARE_PROVIDER_SITE_OTHER): Payer: 59 | Admitting: *Deleted

## 2011-10-15 DIAGNOSIS — I10 Essential (primary) hypertension: Secondary | ICD-10-CM

## 2011-10-15 DIAGNOSIS — R079 Chest pain, unspecified: Secondary | ICD-10-CM

## 2011-10-18 ENCOUNTER — Telehealth: Payer: Self-pay | Admitting: *Deleted

## 2011-10-18 DIAGNOSIS — I10 Essential (primary) hypertension: Secondary | ICD-10-CM

## 2011-10-18 MED ORDER — LISINOPRIL 20 MG PO TABS: 20.0000 mg | ORAL_TABLET | Freq: Every day | ORAL | Status: DC

## 2011-10-18 NOTE — Telephone Encounter (Signed)
Pt notified of below, he will start Lisinopril 20mg  daily, and he has f/u 10/29/11 already, I will add lab order to this.

## 2011-10-18 NOTE — Telephone Encounter (Signed)
Message copied by Annia Belt on Mon Oct 18, 2011 11:28 AM ------      Message from: Vesta Mixer      Created: Wed Oct 13, 2011  5:25 PM       BP readings are mildly elevated.  HR is overall pretty good.  Increase Lisinopril to 20 mg a day and continue other meds.  Office visit in a month or so with BMP.            Thanks

## 2011-10-18 NOTE — Telephone Encounter (Signed)
Attempted to contact pt, LMOM TCB.  

## 2011-10-29 ENCOUNTER — Ambulatory Visit (INDEPENDENT_AMBULATORY_CARE_PROVIDER_SITE_OTHER): Payer: 59 | Admitting: Cardiovascular Disease

## 2011-10-29 ENCOUNTER — Encounter: Payer: Self-pay | Admitting: Cardiovascular Disease

## 2011-10-29 ENCOUNTER — Encounter: Payer: Self-pay | Admitting: *Deleted

## 2011-10-29 ENCOUNTER — Ambulatory Visit (INDEPENDENT_AMBULATORY_CARE_PROVIDER_SITE_OTHER): Payer: 59 | Admitting: *Deleted

## 2011-10-29 DIAGNOSIS — I10 Essential (primary) hypertension: Secondary | ICD-10-CM

## 2011-10-29 DIAGNOSIS — R079 Chest pain, unspecified: Secondary | ICD-10-CM

## 2011-10-29 MED ORDER — POTASSIUM CHLORIDE ER 10 MEQ PO TBCR: 10.0000 meq | EXTENDED_RELEASE_TABLET | Freq: Every day | ORAL | Status: DC

## 2011-10-29 MED ORDER — HYDROCHLOROTHIAZIDE 25 MG PO TABS: 25.0000 mg | ORAL_TABLET | Freq: Every day | ORAL | Status: DC

## 2011-10-29 NOTE — Patient Instructions (Signed)
Your physician has recommended you make the following change in your medication: START Hydrochlorothiazide 25mg  daily. START Potassium once daily.  Your physician recommends that you return for NURSE VISIT FOR BP CHECK AND LABWORK (BMP) : 3-4 WEEKS   Your physician recommends that you schedule a follow-up appointment in: 2-3 MONTHS

## 2011-10-29 NOTE — Assessment & Plan Note (Signed)
Ja's blood pressure readings continue to be elevated. We will add hydrochlorothiazide 25 mg a day and potassium chloride 10 mEq a day. We'll have him come back in 3-4 weeks for a nurse visit and basic metabolic profile.  We'll also check a basic metabolic profile today. I'll see him again in several months.

## 2011-10-29 NOTE — Progress Notes (Signed)
Roger Mcdaniel Date of Birth  [birth year] [facility]     [place] Office  [address]    [address]   [address] [place], [place]  [address]    [place], [place]  [address] [phone]  Fax  [phone]  [phone]  Fax [phone]  Problem list: 1. Hypertension 2. Obesity 3. Obstructive sleep apnea  History of Present Illness:  Roger Mcdaniel is a 61 year old gentleman with a history of hypertension and sleep apnea. I saw him approximately one month ago for hypertension. He has been gradually titrating his medications up and he still had episodes of hypertension. He's been limiting his salt intake. He denies any chest pain or shortness of breath   Current Outpatient Prescriptions on File Prior to Visit  Medication Sig Dispense Refill  . acetaminophen (TYLENOL) 500 MG tablet Take 2 tablets (1,000 mg total) by mouth 3 (three) times daily as needed for pain.      [name] aspirin EC 81 MG tablet Take 1 tablet (81 mg total) by mouth daily.      . B Complex-C-Folic Acid (STRESS B COMPLEX PO) Take by mouth daily.        . carvedilol (COREG) 6.25 MG tablet Take 1 tablet (6.25 mg total) by mouth 2 (two) times daily.  60 tablet  6  . lisinopril (PRINIVIL,ZESTRIL) 20 MG tablet Take 1 tablet (20 mg total) by mouth daily.  30 tablet  12  . Omega-3 Fatty Acids (FISH OIL) 1000 MG CAPS Take by mouth 2 (two) times daily.       . pravastatin (PRAVACHOL) 40 MG tablet Take 1 tablet (40 mg total) by mouth every evening.  30 tablet  11  . Zinc Gluconate (MEGA ZINC-100) 100 MG TBCR Take by mouth daily.          Allergies  Allergen Reactions  . Codeine Sulfate     REACTION: nausea    Past Medical History  Diagnosis Date  . GERD (gastroesophageal reflux disease) 09/2003  . Hyperlipidemia 09/2003  . Hypertension 06/2004  . History of ETT 05/23/2000    Nml (Dr. Melburn Popper)  . Chest pain     Past Surgical History  Procedure Date  . Orif ankle fracture 1980    right  . Septoplasty 2005    for sleep apnea //cpap    History  Smoking status  . Never Smoker   Smokeless tobacco  . Never Used    History  Alcohol Use  . 0.5 oz/week  . 1 drink(s) per week    rarely    Family History  Problem Relation Age of Onset  . Heart disease Father     MI x 2  . Hypertension Brother   . Obesity Brother   . Heart disease Paternal Grandfather      MI  . Cancer Other     Reviw of Systems:  Reviewed in the HPI.  All other systems are negative.  Physical Exam: Blood pressure 173/97, pulse 56, height 5\' 4"  (1.626 m), weight 248 lb (112.492 kg). General: Well developed, well nourished, in no acute distress.  Head: Normocephalic, atraumatic, sclera non-icteric, mucus membranes are moist,   Neck: Supple. Negative for carotid bruits. JVD not elevated.  Lungs: Clear bilaterally to auscultation without wheezes, rales, or rhonchi. Breathing is unlabored.  Heart: RRR with S1 S2. No murmurs, rubs, or gallops appreciated.  Abdomen: Soft, non-tender, non-distended with normoactive bowel sounds. No hepatomegaly. No rebound/guarding. No obvious abdominal masses.  Msk:  Strength and tone appear normal for age.  Extremities: No clubbing or cyanosis. No edema.  Distal pedal pulses are 2+ and equal bilaterally.  Neuro: Alert and oriented X 3. Moves all extremities spontaneously.  Psych:  Responds to questions appropriately with a normal affect.  ECG:  Assessment / Plan:

## 2011-10-30 LAB — BASIC METABOLIC PANEL
Creatinine, Ser: 0.77 mg/dL (ref 0.76–1.27)
GFR calc Af Amer: 115 mL/min/{1.73_m2} (ref >59)
GFR calc non Af Amer: 99 mL/min/{1.73_m2} (ref >59)
Glucose: 87 mg/dL (ref 65–99)
Potassium: 4.6 mmol/L (ref 3.5–5.2)
Sodium: 139 mmol/L (ref 134–144)

## 2011-11-15 ENCOUNTER — Encounter: Payer: Self-pay | Admitting: *Deleted

## 2011-11-15 ENCOUNTER — Ambulatory Visit (INDEPENDENT_AMBULATORY_CARE_PROVIDER_SITE_OTHER): Payer: 59 | Admitting: *Deleted

## 2011-11-15 DIAGNOSIS — I10 Essential (primary) hypertension: Secondary | ICD-10-CM

## 2011-11-15 NOTE — Progress Notes (Signed)
Continue diet, exercise, and meds.  I'll see at next apt.

## 2011-11-15 NOTE — Progress Notes (Signed)
Patient taking blood pressure medications. Blood pressure today is 147/86 with heart rate of 59. The patient's blood pressure has been running a little high in the am but does drop off in the pm. The patient dropped off some reading which will be scanned into the chart.

## 2011-11-16 LAB — BASIC METABOLIC PANEL
Calcium: 9.4 mg/dL (ref 8.7–10.2)
Creatinine, Ser: 0.93 mg/dL (ref 0.76–1.27)
GFR calc Af Amer: 104 mL/min/{1.73_m2} (ref >59)
GFR calc non Af Amer: 90 mL/min/{1.73_m2} (ref >59)
Glucose: 114 mg/dL — ABNORMAL HIGH (ref 65–99)
Potassium: 5 mmol/L (ref 3.5–5.2)

## 2011-12-23 ENCOUNTER — Ambulatory Visit (INDEPENDENT_AMBULATORY_CARE_PROVIDER_SITE_OTHER): Payer: 59 | Admitting: Family Medicine

## 2011-12-23 ENCOUNTER — Encounter: Payer: Self-pay | Admitting: Family Medicine

## 2011-12-23 VITALS — BP 120/66 | HR 72 | Temp 97.9°F | Wt 241.2 lb

## 2011-12-23 DIAGNOSIS — I10 Essential (primary) hypertension: Secondary | ICD-10-CM

## 2011-12-23 DIAGNOSIS — M21619 Bunion of unspecified foot: Secondary | ICD-10-CM

## 2011-12-23 DIAGNOSIS — R7309 Other abnormal glucose: Secondary | ICD-10-CM

## 2011-12-23 DIAGNOSIS — M21629 Bunionette of unspecified foot: Secondary | ICD-10-CM

## 2011-12-23 DIAGNOSIS — E785 Hyperlipidemia, unspecified: Secondary | ICD-10-CM

## 2011-12-23 DIAGNOSIS — Z125 Encounter for screening for malignant neoplasm of prostate: Secondary | ICD-10-CM

## 2011-12-23 NOTE — Progress Notes (Signed)
Subjective:    Patient ID: Roger Mcdaniel, male    DOB: 11-12-1950, 61 y.o.   MRN: [id number]  HPI CC: f/u HTN  Also would like to have left foot evaluated - started noticing about 3 wks ago, started after working outside for long period.  Erythematous bump on lateral side of 5th MTP.  HTN - seen by Dr. Elease Hashimoto, started on HCTZ and coreg.  bp better controlled.  No HA, vision changes, CP/tightness, SOB, leg swelling.  Denies dizziness, lightheaded.  To see Dr. Elease Hashimoto next month.  Avoids salt, uses Ms Sharilyn Sites instead.  On recheck 120/66.  Wt Readings from Last 3 Encounters:  12/23/11 241 lb 4 oz (109.43 kg)  11/15/11 248 lb (112.492 kg)  10/29/11 248 lb (112.492 kg)   Last CPE 04/2012.  Medications and allergies reviewed and updated in chart.  Past histories reviewed and updated if relevant as below. Patient Active Problem List  Diagnoses  . HYPERLIPIDEMIA  . Essential hypertension, benign  . GERD  . SLEEP APNEA  . HYPERGLYCEMIA  . NEPHROLITHIASIS, HX OF  . Chest pain   Past Medical History  Diagnosis Date  . GERD (gastroesophageal reflux disease) 09/2003  . Hyperlipidemia 09/2003  . Hypertension 06/2004  . History of ETT 05/23/2000    Nml (Dr. Melburn Popper)  . Chest pain    Past Surgical History  Procedure Date  . Orif ankle fracture 1980    right  . Septoplasty 2005    for sleep apnea //cpap   History  Substance Use Topics  . Smoking status: Never Smoker   . Smokeless tobacco: Never Used  . Alcohol Use: 0.5 oz/week    1 drink(s) per week     rarely   Family History  Problem Relation Age of Onset  . Heart disease Father     MI x 2  . Hypertension Brother   . Obesity Brother   . Heart disease Paternal Grandfather      MI  . Cancer Other    Allergies  Allergen Reactions  . Codeine Sulfate     REACTION: nausea   Current Outpatient Prescriptions on File Prior to Visit  Medication Sig Dispense Refill  . acetaminophen (TYLENOL) 500 MG tablet Take 2 tablets (1,000  mg total) by mouth 3 (three) times daily as needed for pain.      [name] aspirin EC 81 MG tablet Take 1 tablet (81 mg total) by mouth daily.      . B Complex-C-Folic Acid (STRESS B COMPLEX PO) Take by mouth daily.        . carvedilol (COREG) 6.25 MG tablet Take 1 tablet (6.25 mg total) by mouth 2 (two) times daily.  60 tablet  6  . hydrochlorothiazide (HYDRODIURIL) 25 MG tablet Take 1 tablet (25 mg total) by mouth daily.  30 tablet  6  . lisinopril (PRINIVIL,ZESTRIL) 20 MG tablet Take 1 tablet (20 mg total) by mouth daily.  30 tablet  12  . Omega-3 Fatty Acids (FISH OIL) 1000 MG CAPS Take by mouth 2 (two) times daily.       . potassium chloride (K-DUR) 10 MEQ tablet Take 1 tablet (10 mEq total) by mouth daily.  30 tablet  6  . pravastatin (PRAVACHOL) 40 MG tablet Take 1 tablet (40 mg total) by mouth every evening.  30 tablet  11  . DISCONTD: lisinopril (PRINIVIL,ZESTRIL) 10 MG tablet Take 1 tablet (10 mg total) by mouth daily.  30 tablet  6  Review of Systems Per HPI    Objective:   Physical Exam  Nursing note and vitals reviewed. Constitutional: He appears well-developed and well-nourished. No distress.  HENT:  Head: Normocephalic and atraumatic.  Mouth/Throat: Oropharynx is clear and moist. No oropharyngeal exudate.  Eyes: Conjunctivae and EOM are normal. Pupils are equal, round, and reactive to light. No scleral icterus.  Neck: Normal range of motion. Neck supple. Carotid bruit is not present.  Cardiovascular: Normal rate, regular rhythm, normal heart sounds and intact distal pulses.   No murmur heard. Pulmonary/Chest: Breath sounds normal. No respiratory distress. He has no wheezes. He has no rales.  Musculoskeletal:       Left bunionette present, minimally tender  Skin: Skin is warm and dry. No rash noted.       Assessment & Plan:

## 2011-12-23 NOTE — Patient Instructions (Addendum)
Good to see you today, return in August for physical. We will discuss colon screening then. I'm glad blood pressure is doing better. You have left bunionette - try buying bunionette pad for better support.

## 2011-12-23 NOTE — Assessment & Plan Note (Signed)
rec buy bunionette pad on left.

## 2011-12-23 NOTE — Assessment & Plan Note (Signed)
Stable on current meds. On my repeat, bp 120s/60s. At home sbp running 100s. No changes to meds today.  Has f/u appt with cards next month.

## 2012-01-18 ENCOUNTER — Encounter: Payer: Self-pay | Admitting: Cardiovascular Disease

## 2012-01-18 ENCOUNTER — Ambulatory Visit (INDEPENDENT_AMBULATORY_CARE_PROVIDER_SITE_OTHER): Payer: 59 | Admitting: Cardiovascular Disease

## 2012-01-18 VITALS — BP 116/70 | HR 64 | Ht 64.0 in | Wt 241.0 lb

## 2012-01-18 DIAGNOSIS — I10 Essential (primary) hypertension: Secondary | ICD-10-CM

## 2012-01-18 DIAGNOSIS — E785 Hyperlipidemia, unspecified: Secondary | ICD-10-CM

## 2012-01-18 NOTE — Patient Instructions (Signed)
Follow up with Dr. Elease Hashimoto in 6 months. Need to have blood work prior to the 6 month follow up. Need to have a liver/lipid/Bmet.

## 2012-01-18 NOTE — Assessment & Plan Note (Signed)
Roger Mcdaniel is doing very well. His blood pressures are well controlled. He has a modest response to the HCTZ and potassium that we added at his last office visit. His follow up potassium level was 5. He continues to have some intermittent episodes of leg cramps but these appear to be due to overuse.  I've asked him to exercise on regular basis.  I have asked him to work on a good diet and exercise program in an effort to lose some weight.    I will see him in 6 months for OV and fasting labs.

## 2012-01-18 NOTE — Progress Notes (Signed)
Roger Mcdaniel Date of Birth  March 20, 1951 [facility]     [place] Office  [address]    [address]   [address] [place], [place]  [address]    [place], [place]  [address] [phone]  Fax  [phone]  [phone]  Fax [phone]  Problem list: 1. Hypertension 2. Obesity 3. Obstructive sleep apnea  History of Present Illness:  Roger Mcdaniel is a 61 year old gentleman with a history of hypertension and sleep apnea. I saw him approximately one month ago for hypertension. He has been gradually titrating his medications up and he still had episodes of hypertension. He's been limiting his salt intake. He denies any chest pain or shortness of breath   Current Outpatient Prescriptions on File Prior to Visit  Medication Sig Dispense Refill  . acetaminophen (TYLENOL) 500 MG tablet Take 2 tablets (1,000 mg total) by mouth 3 (three) times daily as needed for pain.      [name] aspirin EC 81 MG tablet Take 1 tablet (81 mg total) by mouth daily.      . B Complex-C-Folic Acid (STRESS B COMPLEX PO) Take by mouth daily.        . carvedilol (COREG) 6.25 MG tablet Take 1 tablet (6.25 mg total) by mouth 2 (two) times daily.  60 tablet  6  . hydrochlorothiazide (HYDRODIURIL) 25 MG tablet Take 1 tablet (25 mg total) by mouth daily.  30 tablet  6  . lisinopril (PRINIVIL,ZESTRIL) 20 MG tablet Take 1 tablet (20 mg total) by mouth daily.  30 tablet  12  . Magnesium Oxide -Mg Supplement 400 MG CAPS Take 1,200 mg by mouth daily. With zinc      . Omega-3 Fatty Acids (FISH OIL) 1000 MG CAPS Take by mouth 2 (two) times daily.       . potassium chloride (K-DUR) 10 MEQ tablet Take 1 tablet (10 mEq total) by mouth daily.  30 tablet  6  . pravastatin (PRAVACHOL) 40 MG tablet Take 1 tablet (40 mg total) by mouth every evening.  30 tablet  11  . Zinc Sulfate (ZINC 15 PO) Take 45 mg by mouth daily. Within magnesium tablet       . DISCONTD: lisinopril (PRINIVIL,ZESTRIL) 10 MG tablet Take 1 tablet (10 mg  total) by mouth daily.  30 tablet  6    Allergies  Allergen Reactions  . Codeine Sulfate     REACTION: nausea    Past Medical History  Diagnosis Date  . GERD (gastroesophageal reflux disease) 09/2003  . Hyperlipidemia 09/2003  . Hypertension 06/2004  . History of ETT 05/23/2000    Nml (Dr. Elease Hashimoto)  . Chest pain     Past Surgical History  Procedure Date  . Orif ankle fracture 1980    right  . Septoplasty 2005    for sleep apnea //cpap    History  Smoking status  . Never Smoker   Smokeless tobacco  . Never Used    History  Alcohol Use  . 0.5 oz/week  . 1 drink(s) per week    rarely    Family History  Problem Relation Age of Onset  . Heart disease Father     MI x 2  . Hypertension Brother   . Obesity Brother   . Heart disease Paternal Grandfather      MI  . Cancer Other     Reviw of Systems:  Reviewed in the HPI.  All other systems are negative.  Physical Exam:  Blood pressure 116/70, pulse 64, height 5\' 4"  (1.626 m), weight 241 lb (109.317 kg). General: Well developed, well nourished, in no acute distress.  Head: Normocephalic, atraumatic, sclera non-icteric, mucus membranes are moist,   Neck: Supple. Negative for carotid bruits. JVD not elevated.  Lungs: Clear bilaterally to auscultation without wheezes, rales, or rhonchi. Breathing is unlabored.  Heart: RRR with S1 S2. No murmurs, rubs, or gallops appreciated.  Abdomen: Soft, non-tender, non-distended with normoactive bowel sounds. No hepatomegaly. No rebound/guarding. No obvious abdominal masses.  Msk:  Strength and tone appear normal for age.  Extremities: No clubbing or cyanosis. No edema.  Distal pedal pulses are 2+ and equal bilaterally.  Neuro: Alert and oriented X 3. Moves all extremities spontaneously.  Psych:  Responds to questions appropriately with a normal affect.  ECG:  Assessment / Plan:

## 2012-04-17 ENCOUNTER — Other Ambulatory Visit: Payer: Self-pay | Admitting: *Deleted

## 2012-04-17 MED ORDER — CARVEDILOL 6.25 MG PO TABS: 6.2500 mg | ORAL_TABLET | Freq: Two times a day (BID) | ORAL | Status: DC

## 2012-04-17 NOTE — Telephone Encounter (Signed)
Refilled carvedilol.

## 2012-05-01 ENCOUNTER — Other Ambulatory Visit: Payer: Self-pay | Admitting: *Deleted

## 2012-05-01 MED ORDER — PRAVASTATIN SODIUM 40 MG PO TABS: 40.0000 mg | ORAL_TABLET | Freq: Every evening | ORAL | Status: DC

## 2012-05-15 ENCOUNTER — Other Ambulatory Visit (INDEPENDENT_AMBULATORY_CARE_PROVIDER_SITE_OTHER): Payer: 59

## 2012-05-15 DIAGNOSIS — Z125 Encounter for screening for malignant neoplasm of prostate: Secondary | ICD-10-CM

## 2012-05-15 DIAGNOSIS — R7309 Other abnormal glucose: Secondary | ICD-10-CM

## 2012-05-15 DIAGNOSIS — I10 Essential (primary) hypertension: Secondary | ICD-10-CM

## 2012-05-15 DIAGNOSIS — E785 Hyperlipidemia, unspecified: Secondary | ICD-10-CM

## 2012-05-15 LAB — HEMOGLOBIN A1C: Hgb A1c MFr Bld: 5.7 % (ref 4.6–6.5)

## 2012-05-15 LAB — BASIC METABOLIC PANEL
BUN: 20 mg/dL (ref 6–23)
Calcium: 9.1 mg/dL (ref 8.4–10.5)
Creatinine, Ser: 0.7 mg/dL (ref 0.4–1.5)
GFR: 120.31 mL/min (ref >60.00)
Glucose, Bld: 108 mg/dL — ABNORMAL HIGH (ref 70–99)
Potassium: 4.8 mEq/L (ref 3.5–5.1)

## 2012-05-15 LAB — LIPID PANEL
HDL: 41.1 mg/dL (ref >39.00)
LDL Cholesterol: 75 mg/dL (ref 0–99)
Total CHOL/HDL Ratio: 3
VLDL: 19 mg/dL (ref 0.0–40.0)

## 2012-05-16 ENCOUNTER — Other Ambulatory Visit: Payer: 59

## 2012-05-23 ENCOUNTER — Ambulatory Visit (INDEPENDENT_AMBULATORY_CARE_PROVIDER_SITE_OTHER): Payer: 59 | Admitting: Family Medicine

## 2012-05-23 ENCOUNTER — Encounter: Payer: Self-pay | Admitting: Family Medicine

## 2012-05-23 VITALS — BP 116/64 | HR 68 | Temp 98.1°F | Ht 64.0 in | Wt 244.0 lb

## 2012-05-23 DIAGNOSIS — I10 Essential (primary) hypertension: Secondary | ICD-10-CM

## 2012-05-23 DIAGNOSIS — E785 Hyperlipidemia, unspecified: Secondary | ICD-10-CM

## 2012-05-23 DIAGNOSIS — Z1211 Encounter for screening for malignant neoplasm of colon: Secondary | ICD-10-CM

## 2012-05-23 DIAGNOSIS — R7309 Other abnormal glucose: Secondary | ICD-10-CM

## 2012-05-23 DIAGNOSIS — Z0000 Encounter for general adult medical examination without abnormal findings: Secondary | ICD-10-CM | POA: Insufficient documentation

## 2012-05-23 NOTE — Assessment & Plan Note (Signed)
Preventative protocols reviewed and updated unless pt declined. Discussed healthy diet and lifestyle.  

## 2012-05-23 NOTE — Assessment & Plan Note (Signed)
Chronic. Great control as evidenced by recent OV numbers.

## 2012-05-23 NOTE — Assessment & Plan Note (Signed)
Reviewed numbers - discussed low carb diet and increased activity.

## 2012-05-23 NOTE — Patient Instructions (Signed)
Stool kit today. Good to see you today, call us with questions. Return as needed or in 1 year for next physical.

## 2012-05-23 NOTE — Progress Notes (Signed)
Subjective:    Patient ID: Roger Mcdaniel, male    DOB: Jan 13, 1951, 61 y.o.   MRN: [id number]  HPI CC: CPE  No questions or concerns today. Sore hands and hips. Son and his family have moved in with him while their house is being completed.  HTN - compliant with meds.   BP Readings from Last 3 Encounters:  05/23/12 116/64  01/18/12 116/70  12/23/11 120/66    Preventative: Colon cancer screening - does yearly stool kit.  No h/o blood in stool.  Would like to continue stool kit. Prostate cancer screening - discussed.  would like to continue Seat belt use discussed.   Sunscreen use discussed. Has never had flu shot. Tetanus 2012.  Medications and allergies reviewed and updated in chart.  Past histories reviewed and updated if relevant as below. Patient Active Problem List  Diagnosis  . HYPERLIPIDEMIA  . Essential hypertension, benign  . GERD  . SLEEP APNEA  . HYPERGLYCEMIA  . NEPHROLITHIASIS, HX OF  . Chest pain  . Bunionette   Past Medical History  Diagnosis Date  . GERD (gastroesophageal reflux disease) 09/2003  . Hyperlipidemia 09/2003  . Hypertension 06/2004  . History of ETT 05/23/2000    Nml (Dr. Elease Hashimoto)  . Chest pain   . OSA (obstructive sleep apnea)     was on CPAP, currently off   Past Surgical History  Procedure Date  . Orif ankle fracture 1980    right  . Septoplasty 2005    for sleep apnea //cpap   History  Substance Use Topics  . Smoking status: Never Smoker   . Smokeless tobacco: Never Used  . Alcohol Use: 0.5 oz/week    1 drink(s) per week     rarely   Family History  Problem Relation Age of Onset  . Heart disease Father     MI x 2  . Hypertension Brother   . Obesity Brother   . Heart disease Paternal Grandfather      MI  . Cancer Other    Allergies  Allergen Reactions  . Codeine Sulfate     REACTION: nausea   Current Outpatient Prescriptions on File Prior to Visit  Medication Sig Dispense Refill  . acetaminophen (TYLENOL)  500 MG tablet Take 2 tablets (1,000 mg total) by mouth 3 (three) times daily as needed for pain.      [name] aspirin EC 81 MG tablet Take 1 tablet (81 mg total) by mouth daily.      . B Complex-C-Folic Acid (STRESS B COMPLEX PO) Take by mouth daily.        . carvedilol (COREG) 6.25 MG tablet Take 1 tablet (6.25 mg total) by mouth 2 (two) times daily.  60 tablet  6  . hydrochlorothiazide (HYDRODIURIL) 25 MG tablet Take 1 tablet (25 mg total) by mouth daily.  30 tablet  6  . lisinopril (PRINIVIL,ZESTRIL) 20 MG tablet Take 1 tablet (20 mg total) by mouth daily.  30 tablet  12  . Magnesium Oxide -Mg Supplement 400 MG CAPS Take 1,200 mg by mouth daily. With zinc      . Omega-3 Fatty Acids (FISH OIL) 1000 MG CAPS Take by mouth 2 (two) times daily.       . potassium chloride (K-DUR) 10 MEQ tablet Take 1 tablet (10 mEq total) by mouth daily.  30 tablet  6  . pravastatin (PRAVACHOL) 40 MG tablet Take 1 tablet (40 mg total) by mouth every evening.  30 tablet  11  . Zinc Sulfate (ZINC 15 PO) Take 45 mg by mouth daily. Within magnesium tablet       . DISCONTD: lisinopril (PRINIVIL,ZESTRIL) 10 MG tablet Take 1 tablet (10 mg total) by mouth daily.  30 tablet  6     Review of Systems  Constitutional: Negative for fever, chills, activity change, appetite change, fatigue and unexpected weight change.  HENT: Negative for hearing loss and neck pain.   Eyes: Negative for visual disturbance.  Respiratory: Negative for cough, chest tightness, shortness of breath and wheezing.   Cardiovascular: Positive for leg swelling (worse in evening). Negative for chest pain and palpitations.  Gastrointestinal: Negative for nausea, vomiting, abdominal pain, diarrhea, constipation, blood in stool and abdominal distention.  Genitourinary: Negative for hematuria and difficulty urinating.  Musculoskeletal: Positive for arthralgias. Negative for myalgias.  Skin: Negative for rash.  Neurological: Negative for dizziness, seizures,  syncope and headaches.  Hematological: Does not bruise/bleed easily.  Psychiatric/Behavioral: Negative for dysphoric mood. The patient is not nervous/anxious.        Objective:   Physical Exam  Nursing note and vitals reviewed. Constitutional: He is oriented to person, place, and time. He appears well-developed and well-nourished. No distress.  HENT:  Head: Normocephalic and atraumatic.  Right Ear: Hearing, tympanic membrane, external ear and ear canal normal.  Left Ear: Hearing, tympanic membrane, external ear and ear canal normal.  Nose: Nose normal.  Mouth/Throat: Oropharynx is clear and moist. No oropharyngeal exudate.       Fluid behind L TM  Eyes: Conjunctivae and EOM are normal. Pupils are equal, round, and reactive to light. No scleral icterus.  Neck: Normal range of motion. Neck supple. Carotid bruit is not present.  Cardiovascular: Normal rate, regular rhythm, normal heart sounds and intact distal pulses.   No murmur heard. Pulses:      Radial pulses are 2+ on the right side, and 2+ on the left side.  Pulmonary/Chest: Effort normal and breath sounds normal. No respiratory distress. He has no wheezes. He has no rales.  Abdominal: Soft. Bowel sounds are normal. He exhibits no distension and no mass. There is no tenderness. There is no rebound and no guarding.  Genitourinary: Rectum normal and prostate normal. Rectal exam shows no external hemorrhoid, no internal hemorrhoid, no fissure, no mass, no tenderness and anal tone normal. Guaiac negative stool. Prostate is not enlarged (15gm) and not tender.  Musculoskeletal: Normal range of motion. He exhibits no edema.  Lymphadenopathy:    He has no cervical adenopathy.  Neurological: He is alert and oriented to person, place, and time.       CN grossly intact, station and gait intact  Skin: Skin is warm and dry. No rash noted.  Psychiatric: He has a normal mood and affect. His behavior is normal. Judgment and thought content normal.       Assessment & Plan:

## 2012-05-23 NOTE — Assessment & Plan Note (Signed)
continue pravastatin.  Goal LDL <100. Lab Results  Component Value Date   LDLCALC 75 05/15/2012

## 2012-05-24 ENCOUNTER — Other Ambulatory Visit: Payer: Self-pay | Admitting: Cardiovascular Disease

## 2012-05-24 MED ORDER — POTASSIUM CHLORIDE ER 10 MEQ PO TBCR: 10.0000 meq | EXTENDED_RELEASE_TABLET | Freq: Every day | ORAL | Status: DC

## 2012-05-24 MED ORDER — HYDROCHLOROTHIAZIDE 25 MG PO TABS: 25.0000 mg | ORAL_TABLET | Freq: Every day | ORAL | Status: DC

## 2012-05-24 NOTE — Telephone Encounter (Signed)
Refilled Hydrochlorothiazide and Potassium.

## 2012-06-07 ENCOUNTER — Encounter: Payer: Self-pay | Admitting: *Deleted

## 2012-06-07 ENCOUNTER — Other Ambulatory Visit: Payer: 59

## 2012-06-07 DIAGNOSIS — Z1211 Encounter for screening for malignant neoplasm of colon: Secondary | ICD-10-CM

## 2012-06-22 ENCOUNTER — Telehealth: Payer: Self-pay | Admitting: *Deleted

## 2012-06-22 NOTE — Telephone Encounter (Signed)
Pravastatin is least potent statin, doesn't usually cause these issues.  I recommend pt do trial off pravastatin for next 1-2 weeks - see if any improvement.  If better, to update me for rec on new medicine for cholesterol.  If not better, recommend come in for OV for evaluation.

## 2012-06-22 NOTE — Telephone Encounter (Signed)
Call taken from patients wife Olegario Messier. Patients call yesterday and spoke with Call A Nurse . Wife states that he did not receive a call back. Patient is experiencing Joint pain with increasing pain in his knee caps. She also relates that he is experiencing bilateral hand swelling, Pt spoke with his pharmacist and was told that it could be the Pravastatin. Please Advise

## 2012-06-22 NOTE — Telephone Encounter (Signed)
Patient notified to hold pravastatin for 2 weeks and then update on status. Advised if weakness/pain continued to come in for OV. Understanding was verbalized.

## 2012-07-17 ENCOUNTER — Ambulatory Visit (INDEPENDENT_AMBULATORY_CARE_PROVIDER_SITE_OTHER): Payer: 59

## 2012-07-17 DIAGNOSIS — E785 Hyperlipidemia, unspecified: Secondary | ICD-10-CM

## 2012-07-17 DIAGNOSIS — I10 Essential (primary) hypertension: Secondary | ICD-10-CM

## 2012-07-18 LAB — BASIC METABOLIC PANEL
Creatinine, Ser: 0.9 mg/dL (ref 0.76–1.27)
GFR calc Af Amer: 107 mL/min/{1.73_m2} (ref >59)
GFR calc non Af Amer: 93 mL/min/{1.73_m2} (ref >59)
Potassium: 5.5 mmol/L — ABNORMAL HIGH (ref 3.5–5.2)
Sodium: 138 mmol/L (ref 134–144)

## 2012-07-18 LAB — HEPATIC FUNCTION PANEL
Alkaline Phosphatase: 50 IU/L (ref 44–102)
Bilirubin, Direct: 0.13 mg/dL (ref 0.00–0.40)
Total Bilirubin: 0.5 mg/dL (ref 0.0–1.2)
Total Protein: 6.7 g/dL (ref 6.0–8.5)

## 2012-07-18 LAB — LIPID PANEL
Cholesterol, Total: 184 mg/dL (ref 100–199)
LDL Calculated: 124 mg/dL — ABNORMAL HIGH (ref 0–99)

## 2012-07-19 ENCOUNTER — Encounter: Payer: Self-pay | Admitting: Cardiovascular Disease

## 2012-07-19 ENCOUNTER — Ambulatory Visit (INDEPENDENT_AMBULATORY_CARE_PROVIDER_SITE_OTHER): Payer: 59 | Admitting: Cardiovascular Disease

## 2012-07-19 VITALS — BP 124/74 | HR 59 | Ht 64.0 in | Wt 242.8 lb

## 2012-07-19 DIAGNOSIS — I10 Essential (primary) hypertension: Secondary | ICD-10-CM

## 2012-07-19 DIAGNOSIS — E785 Hyperlipidemia, unspecified: Secondary | ICD-10-CM

## 2012-07-19 NOTE — Assessment & Plan Note (Signed)
Roger Mcdaniel has a history of hyperlipidemia. He Also was found to have early diabetes. He also has obesity.  He does not tolerate Pravachol and I think the odds of him tolerating atorvastatin or rosuvastatin are quite low.  I told him that I think his best option is to really work hard on a good diet and exercise program.  He's never been diagnosed with coronary artery disease.  We'll have him return in 3 months for fasting lipid profile, hepatic profile, and basic metabolic profile. We'll also draw a Lipomed profile to get his LDL particle number.  I seen again in 6 months for followup visit.

## 2012-07-19 NOTE — Progress Notes (Signed)
Roger Mcdaniel Date of Birth  [birth year] [facility]     [place] [facility]  [address]    [address]   [address] [place], [place]  [address]    [place], [place]  [address] [phone]  Fax  [phone]  [phone]  Fax [phone]  Problem list: 1. Hypertension 2. Obesity 3. Obstructive sleep apnea  History of Present Illness:  Roger Mcdaniel is a 61 year old gentleman with a history of hypertension and sleep apnea. I saw him approximately one month ago for hypertension. He has been gradually titrating his medications up and he still had episodes of hypertension. He's been limiting his salt intake. He denies any chest pain or shortness of breath.  He's been taking care of his granddaughter for the past 6 months. As result he's gained a lot of weight. He's not been able to exercise.  He was tried on Pravachol. He had lots of muscle aches and pains and had to stop the Pravachol. His muscle aches and pains have improved.   Current Outpatient Prescriptions on File Prior to Visit  Medication Sig Dispense Refill  . acetaminophen (TYLENOL) 500 MG tablet Take 2 tablets (1,000 mg total) by mouth 3 (three) times daily as needed for pain.      [name] aspirin EC 81 MG tablet Take 1 tablet (81 mg total) by mouth daily.      . B Complex-C-Folic Acid (STRESS B COMPLEX PO) Take by mouth daily.        . carvedilol (COREG) 6.25 MG tablet Take 1 tablet (6.25 mg total) by mouth 2 (two) times daily.  60 tablet  6  . hydrochlorothiazide (HYDRODIURIL) 25 MG tablet Take 1 tablet (25 mg total) by mouth daily.  30 tablet  6  . lisinopril (PRINIVIL,ZESTRIL) 20 MG tablet Take 1 tablet (20 mg total) by mouth daily.  30 tablet  12  . Magnesium Oxide -Mg Supplement 400 MG CAPS Take 1,200 mg by mouth daily. With zinc      . Omega-3 Fatty Acids (FISH OIL) 1000 MG CAPS Take by mouth 2 (two) times daily.       . potassium chloride (K-DUR) 10 MEQ tablet Take 1 tablet (10 mEq total) by mouth daily.  30 tablet   6  . Zinc Sulfate (ZINC 15 PO) Take 45 mg by mouth daily. Within magnesium tablet       . DISCONTD: lisinopril (PRINIVIL,ZESTRIL) 10 MG tablet Take 1 tablet (10 mg total) by mouth daily.  30 tablet  6    Allergies  Allergen Reactions  . Codeine Sulfate     REACTION: nausea  . Pravachol (Pravastatin Sodium)     Muscle aches    Past Medical History  Diagnosis Date  . GERD (gastroesophageal reflux disease) 09/2003  . Hyperlipidemia 09/2003  . Hypertension 06/2004  . History of ETT 05/23/2000    Nml (Dr. Elease Hashimoto)  . Chest pain   . OSA (obstructive sleep apnea)     was on CPAP, currently off  . Prediabetes 07/10/2008    Past Surgical History  Procedure Date  . Orif ankle fracture 1980    right  . Septoplasty 2005    for sleep apnea //cpap    History  Smoking status  . Never Smoker   Smokeless tobacco  . Never Used    History  Alcohol Use  . 0.5 oz/week  . 1 drink(s) per week    rarely    Family History  Problem Relation  Age of Onset  . Heart disease Father     MI x 2  . Hypertension Brother   . Obesity Brother   . Heart disease Paternal Grandfather      MI  . Cancer Other     Reviw of Systems:  Reviewed in the HPI.  All other systems are negative.  Physical Exam: Blood pressure 124/74, pulse 59, height 5\' 4"  (1.626 m), weight 242 lb 12 oz (110.111 kg). General: Well developed, well nourished, in no acute distress.  Head: Normocephalic, atraumatic, sclera non-icteric, mucus membranes are moist,   Neck: Supple. Negative for carotid bruits. JVD not elevated.  Lungs: Clear bilaterally to auscultation without wheezes, rales, or rhonchi. Breathing is unlabored.  Heart: RRR with S1 S2. No murmurs, rubs, or gallops appreciated.  Abdomen: Soft, non-tender, non-distended with normoactive bowel sounds. He is moderately obese.  Msk:  Strength and tone appear normal for age.  Extremities: No clubbing or cyanosis. No edema.  Distal pedal pulses are 2+ and  equal bilaterally.  Neuro: Alert and oriented X 3. Moves all extremities spontaneously.  Psych:  Responds to questions appropriately with a normal affect.  ECG: 07/19/2012-sinus bradycardia at 59 beats a minute. He has no ST or T wave changes. Assessment / Plan:

## 2012-07-19 NOTE — Patient Instructions (Addendum)
Your physician wants you to follow-up in: 6 months with Dr. Elease Hashimoto. You will receive a reminder letter in the mail two months in advance. If you don't receive a letter, please call our office to schedule the follow-up appointment.  Your physician recommends that you return for lab work in: 3 months fasting

## 2012-07-24 ENCOUNTER — Other Ambulatory Visit: Payer: Self-pay

## 2012-07-24 DIAGNOSIS — I25810 Atherosclerosis of coronary artery bypass graft(s) without angina pectoris: Secondary | ICD-10-CM

## 2012-07-24 NOTE — Addendum Note (Signed)
Addended by: Sabino Snipes E on: 07/24/2012 08:37 AM   Modules accepted: Orders

## 2012-10-19 ENCOUNTER — Other Ambulatory Visit: Payer: Self-pay

## 2012-10-19 MED ORDER — LISINOPRIL 20 MG PO TABS: 20.0000 mg | ORAL_TABLET | Freq: Every day | ORAL | Status: DC

## 2012-10-19 NOTE — Telephone Encounter (Signed)
Refill sent for lisinopril  

## 2012-11-11 ENCOUNTER — Other Ambulatory Visit: Payer: Self-pay

## 2012-11-24 ENCOUNTER — Other Ambulatory Visit: Payer: Self-pay | Admitting: *Deleted

## 2012-11-24 MED ORDER — CARVEDILOL 6.25 MG PO TABS: 6.2500 mg | ORAL_TABLET | Freq: Two times a day (BID) | ORAL | Status: DC

## 2012-11-30 ENCOUNTER — Other Ambulatory Visit: Payer: Self-pay | Admitting: *Deleted

## 2012-11-30 MED ORDER — POTASSIUM CHLORIDE ER 10 MEQ PO TBCR: 10.0000 meq | EXTENDED_RELEASE_TABLET | Freq: Every day | ORAL | Status: DC

## 2012-11-30 NOTE — Telephone Encounter (Signed)
Opened in Error.

## 2012-11-30 NOTE — Telephone Encounter (Signed)
Fax Received. Refill Completed. Roger Mcdaniel (M.A)  

## 2012-12-21 ENCOUNTER — Ambulatory Visit (INDEPENDENT_AMBULATORY_CARE_PROVIDER_SITE_OTHER): Payer: 59

## 2012-12-21 DIAGNOSIS — I10 Essential (primary) hypertension: Secondary | ICD-10-CM

## 2012-12-21 DIAGNOSIS — I25810 Atherosclerosis of coronary artery bypass graft(s) without angina pectoris: Secondary | ICD-10-CM

## 2012-12-21 DIAGNOSIS — E785 Hyperlipidemia, unspecified: Secondary | ICD-10-CM

## 2012-12-22 ENCOUNTER — Other Ambulatory Visit: Payer: 59

## 2012-12-22 LAB — HEPATIC FUNCTION PANEL
AST: 20 IU/L (ref 0–40)
Albumin: 4.3 g/dL (ref 3.6–4.8)
Alkaline Phosphatase: 50 IU/L (ref 39–117)
Bilirubin, Direct: 0.16 mg/dL (ref 0.00–0.40)
Total Bilirubin: 0.6 mg/dL (ref 0.0–1.2)
Total Protein: 6.6 g/dL (ref 6.0–8.5)

## 2012-12-22 LAB — BASIC METABOLIC PANEL
BUN/Creatinine Ratio: 22 (ref 10–22)
CO2: 23 mmol/L (ref 19–28)
Chloride: 102 mmol/L (ref 97–108)
GFR calc Af Amer: 107 mL/min/{1.73_m2} (ref >59)
Sodium: 140 mmol/L (ref 134–144)

## 2012-12-22 LAB — LIPID PANEL: Cholesterol, Total: 201 mg/dL — ABNORMAL HIGH (ref 100–199)

## 2012-12-26 ENCOUNTER — Other Ambulatory Visit: Payer: Self-pay

## 2012-12-26 DIAGNOSIS — E785 Hyperlipidemia, unspecified: Secondary | ICD-10-CM

## 2012-12-27 ENCOUNTER — Other Ambulatory Visit: Payer: Self-pay | Admitting: *Deleted

## 2012-12-27 ENCOUNTER — Telehealth: Payer: Self-pay

## 2012-12-27 ENCOUNTER — Ambulatory Visit (INDEPENDENT_AMBULATORY_CARE_PROVIDER_SITE_OTHER): Payer: 59

## 2012-12-27 DIAGNOSIS — E785 Hyperlipidemia, unspecified: Secondary | ICD-10-CM

## 2012-12-27 MED ORDER — HYDROCHLOROTHIAZIDE 25 MG PO TABS: 25.0000 mg | ORAL_TABLET | Freq: Every day | ORAL | Status: DC

## 2012-12-27 NOTE — Telephone Encounter (Signed)
Patient wanted to let Dr. Mariah Milling know he was told to start taking Prednisone taper pack at 5 mg for 12 days and celebrex 200 mg one tablet daily. Please advise if you think these two new medications will interfere with his current medications.

## 2012-12-28 LAB — NMR, LIPOPROFILE
Cholesterol: 208 mg/dL — ABNORMAL HIGH (ref <200)
HDL Cholesterol by NMR: 38 mg/dL — ABNORMAL LOW (ref >=40)
HDL Particle Number: 29.5 umol/L — ABNORMAL LOW (ref >=30.5)
LDL Particle Number: 2111 nmol/L — ABNORMAL HIGH (ref <1000)
LDLC SERPL CALC-MCNC: 129 mg/dL — ABNORMAL HIGH (ref <100)
LP-IR Score: 67 — ABNORMAL HIGH (ref <=45)

## 2012-12-28 NOTE — Telephone Encounter (Signed)
These meds should be ok Have him watch his BP.  He may have some fluid retention.

## 2012-12-28 NOTE — Telephone Encounter (Signed)
Notified patient per Dr. Elease Hashimoto okay to take the celebrex & prednisone but need to keep an eye on blood pressure and may have some fluid retention.

## 2013-01-01 ENCOUNTER — Other Ambulatory Visit: Payer: Self-pay

## 2013-01-01 ENCOUNTER — Encounter: Payer: Self-pay | Admitting: Family Medicine

## 2013-01-01 MED ORDER — ROSUVASTATIN CALCIUM 5 MG PO TABS: 5.0000 mg | ORAL_TABLET | Freq: Every day | ORAL | Status: DC

## 2013-01-23 ENCOUNTER — Encounter: Payer: Self-pay | Admitting: Cardiovascular Disease

## 2013-02-09 ENCOUNTER — Ambulatory Visit (INDEPENDENT_AMBULATORY_CARE_PROVIDER_SITE_OTHER): Payer: 59 | Admitting: Cardiovascular Disease

## 2013-02-09 ENCOUNTER — Telehealth: Payer: Self-pay

## 2013-02-09 ENCOUNTER — Encounter: Payer: Self-pay | Admitting: Cardiovascular Disease

## 2013-02-09 ENCOUNTER — Other Ambulatory Visit: Payer: Self-pay

## 2013-02-09 VITALS — BP 110/70 | HR 64 | Ht 65.0 in | Wt 253.0 lb

## 2013-02-09 DIAGNOSIS — I10 Essential (primary) hypertension: Secondary | ICD-10-CM

## 2013-02-09 DIAGNOSIS — E785 Hyperlipidemia, unspecified: Secondary | ICD-10-CM

## 2013-02-09 MED ORDER — ATORVASTATIN CALCIUM 80 MG PO TABS: 80.0000 mg | ORAL_TABLET | Freq: Every day | ORAL | Status: DC

## 2013-02-09 NOTE — Patient Instructions (Addendum)
Your physician wants you to follow-up in: 6 months with Dr. Elease Hashimoto. Labs same day. Make sure you are fasting. You will receive a reminder letter in the mail two months in advance. If you don't receive a letter, please call our office to schedule the follow-up appointment.

## 2013-02-09 NOTE — Progress Notes (Signed)
Roger Mcdaniel Date of Birth  Mar 26, 1951 [facility]     [place] Office  [address]    [address]   [address] [place], [place]  [address]    [place], [place]  [address] [phone]  Fax  [phone]  [phone]  Fax [phone]  Problem list: 1. Hypertension 2. Obesity 3. Obstructive sleep apnea  History of Present Illness:  Roger Mcdaniel is a 62 year old gentleman with a history of hypertension and sleep apnea. I saw him approximately one month ago for hypertension. He has been gradually titrating his medications up and he still had episodes of hypertension. He's been limiting his salt intake. He denies any chest pain or shortness of breath.  He's been taking care of his granddaughter for the past 6 months. As result he's gained a lot of weight. He's not been able to exercise.  He was tried on Pravachol. He had lots of muscle aches and pains and had to stop the Pravachol. His muscle aches and pains have improved.  Feb 09, 2013:  Doing well from a cardiac standpoint.  He had some generalized aches that lasted 3 days.  Associated with some stomach issues.   Current Outpatient Prescriptions on File Prior to Visit  Medication Sig Dispense Refill  . B Complex-C-Folic Acid (STRESS B COMPLEX PO) Take by mouth daily.        . carvedilol (COREG) 6.25 MG tablet Take 1 tablet (6.25 mg total) by mouth 2 (two) times daily.  60 tablet  6  . hydrochlorothiazide (HYDRODIURIL) 25 MG tablet Take 1 tablet (25 mg total) by mouth daily.  30 tablet  6  . lisinopril (PRINIVIL,ZESTRIL) 20 MG tablet Take 1 tablet (20 mg total) by mouth daily.  30 tablet  12  . Magnesium Oxide -Mg Supplement 400 MG CAPS Take 1,200 mg by mouth daily. With zinc      . Omega-3 Fatty Acids (FISH OIL) 1000 MG CAPS Take by mouth 2 (two) times daily.       . potassium chloride (K-DUR) 10 MEQ tablet Take 1 tablet (10 mEq total) by mouth daily.  30 tablet  6  . rosuvastatin (CRESTOR) 5 MG tablet Take 1 tablet  (5 mg total) by mouth daily.  35 tablet  0  . Zinc Sulfate (ZINC 15 PO) Take 45 mg by mouth daily. Within magnesium tablet        No current facility-administered medications on file prior to visit.    Allergies  Allergen Reactions  . Codeine Sulfate     REACTION: nausea  . Pravachol (Pravastatin Sodium)     Muscle aches    Past Medical History  Diagnosis Date  . GERD (gastroesophageal reflux disease) 09/2003  . Hyperlipidemia 09/2003  . Hypertension 06/2004  . History of ETT 05/23/2000    Nml (Dr. Elease Hashimoto)  . Chest pain   . OSA (obstructive sleep apnea)     was on CPAP, currently off  . Prediabetes 07/10/2008  . CMC arthritis, thumb, degenerative     Past Surgical History  Procedure Laterality Date  . Orif ankle fracture  1980    right  . Septoplasty  2005    for sleep apnea //cpap    History  Smoking status  . Never Smoker   Smokeless tobacco  . Never Used    History  Alcohol Use  . 0.5 oz/week  . 1 drink(s) per week    Comment: rarely    Family History  Problem  Relation Age of Onset  . Heart disease Father     MI x 2  . Hypertension Brother   . Obesity Brother   . Heart disease Paternal Grandfather      MI  . Cancer Other     Reviw of Systems:  Reviewed in the HPI.  All other systems are negative.  Physical Exam: Blood pressure 110/70, pulse 64, height 5\' 5"  (1.651 m), weight 253 lb (114.76 kg). General: Well developed, well nourished, in no acute distress.  Head: Normocephalic, atraumatic, sclera non-icteric, mucus membranes are moist,   Neck: Supple. Negative for carotid bruits. JVD not elevated.  Lungs: Clear bilaterally to auscultation without wheezes, rales, or rhonchi. Breathing is unlabored.  Heart: RRR with S1 S2. No murmurs, rubs, or gallops appreciated.  Abdomen: Soft, non-tender, non-distended with normoactive bowel sounds. He is moderately obese.  Msk:  Strength and tone appear normal for age.  Extremities: No clubbing or  cyanosis. No edema.  Distal pedal pulses are 2+ and equal bilaterally.  Neuro: Alert and oriented X 3. Moves all extremities spontaneously.  Psych:  Responds to questions appropriately with a normal affect.  ECG: Feb 09, 2013:  NSR at 64,  No ST or T wave changes. Assessment / Plan:

## 2013-02-09 NOTE — Addendum Note (Signed)
Addended by: Sabino Snipes E on: 02/09/2013 08:32 AM   Modules accepted: Orders

## 2013-02-09 NOTE — Telephone Encounter (Signed)
Per Dr. Elease Hashimoto, ok to try atorvastatin 80 mg daily I will call pt to inform

## 2013-02-09 NOTE — Telephone Encounter (Signed)
Pt called back Says he checked price/pharmacy for Crestor and copay for both mail order and pharmacy =$45 He says [facility] list states he can get atorvastatin for $15. He asks if Dr. Elease Hashimoto would switch him to this med I told him I would check with Dr. Elease Hashimoto and call him back at [phone]

## 2013-02-09 NOTE — Assessment & Plan Note (Signed)
Roger Mcdaniel's BP is well controlled.  He is watching his salt.  I 've encouraged him to work on a good diet, exercise, and weight loss program which will help multiple aspects of his care.

## 2013-02-09 NOTE — Assessment & Plan Note (Signed)
His LDL particle number was 2111 off medications.  Our goal is < 1000.  We have started Crestor 5 mg a day which he tolerates fairly well.   Will check a repeat lipomed profile, liver profile, and BMP in 2-3 months.

## 2013-02-09 NOTE — Telephone Encounter (Signed)
Pt informed Understanding verb RX sent to pharm 

## 2013-04-02 ENCOUNTER — Ambulatory Visit (INDEPENDENT_AMBULATORY_CARE_PROVIDER_SITE_OTHER): Payer: 59

## 2013-04-02 DIAGNOSIS — E785 Hyperlipidemia, unspecified: Secondary | ICD-10-CM

## 2013-04-02 DIAGNOSIS — I10 Essential (primary) hypertension: Secondary | ICD-10-CM

## 2013-04-03 LAB — LIPID PANEL
Cholesterol, Total: 109 mg/dL (ref 100–199)
HDL: 39 mg/dL — ABNORMAL LOW (ref >39)
LDL Calculated: 52 mg/dL (ref 0–99)
Triglycerides: 90 mg/dL (ref 0–149)
VLDL Cholesterol Cal: 18 mg/dL (ref 5–40)

## 2013-04-04 LAB — NMR, LIPOPROFILE
HDL Cholesterol by NMR: 38 mg/dL — ABNORMAL LOW (ref >=40)
HDL Particle Number: 26.7 umol/L — ABNORMAL LOW (ref >=30.5)
LDLC SERPL CALC-MCNC: 55 mg/dL (ref <100)

## 2013-06-07 ENCOUNTER — Other Ambulatory Visit: Payer: Self-pay | Admitting: *Deleted

## 2013-06-07 MED ORDER — ATORVASTATIN CALCIUM 80 MG PO TABS: 80.0000 mg | ORAL_TABLET | Freq: Every day | ORAL | Status: DC

## 2013-06-18 ENCOUNTER — Other Ambulatory Visit: Payer: Self-pay | Admitting: *Deleted

## 2013-06-18 MED ORDER — POTASSIUM CHLORIDE ER 10 MEQ PO TBCR: 10.0000 meq | EXTENDED_RELEASE_TABLET | Freq: Every day | ORAL | Status: DC

## 2013-06-18 MED ORDER — CARVEDILOL 6.25 MG PO TABS: 6.2500 mg | ORAL_TABLET | Freq: Two times a day (BID) | ORAL | Status: DC

## 2013-07-27 ENCOUNTER — Other Ambulatory Visit: Payer: Self-pay

## 2013-07-27 MED ORDER — HYDROCHLOROTHIAZIDE 25 MG PO TABS: 25.0000 mg | ORAL_TABLET | Freq: Every day | ORAL | Status: DC

## 2013-08-02 ENCOUNTER — Other Ambulatory Visit: Payer: Self-pay

## 2013-08-26 ENCOUNTER — Encounter: Payer: Self-pay | Admitting: Cardiovascular Disease

## 2013-08-27 ENCOUNTER — Other Ambulatory Visit: Payer: Self-pay | Admitting: *Deleted

## 2013-08-27 MED ORDER — CARVEDILOL 6.25 MG PO TABS: 6.2500 mg | ORAL_TABLET | Freq: Two times a day (BID) | ORAL | Status: DC

## 2013-09-16 ENCOUNTER — Encounter: Payer: Self-pay | Admitting: Cardiovascular Disease

## 2013-09-17 MED ORDER — POTASSIUM CHLORIDE ER 10 MEQ PO TBCR: 10.0000 meq | EXTENDED_RELEASE_TABLET | Freq: Every day | ORAL | Status: DC

## 2013-09-27 DIAGNOSIS — M5030 Other cervical disc degeneration, unspecified cervical region: Secondary | ICD-10-CM

## 2013-09-27 HISTORY — DX: Other cervical disc degeneration, unspecified cervical region: M50.30

## 2013-10-07 ENCOUNTER — Encounter: Payer: Self-pay | Admitting: Cardiovascular Disease

## 2013-10-08 ENCOUNTER — Other Ambulatory Visit: Payer: Self-pay

## 2013-10-08 MED ORDER — ATORVASTATIN CALCIUM 80 MG PO TABS: 80.0000 mg | ORAL_TABLET | Freq: Every day | ORAL | Status: DC

## 2013-11-01 ENCOUNTER — Telehealth: Payer: Self-pay

## 2013-11-01 NOTE — Telephone Encounter (Signed)
Informed patient that he would have labs drawn at his appt

## 2013-11-01 NOTE — Telephone Encounter (Signed)
Pt has appt with Dr Elease HashimotoNahser on 2/23. States he usually get labs before his appt day. Please call and advise.

## 2013-11-08 ENCOUNTER — Other Ambulatory Visit: Payer: Self-pay | Admitting: Cardiovascular Disease

## 2013-11-13 ENCOUNTER — Other Ambulatory Visit: Payer: Self-pay | Admitting: Cardiovascular Disease

## 2013-11-13 ENCOUNTER — Other Ambulatory Visit: Payer: Self-pay

## 2013-11-13 MED ORDER — ATORVASTATIN CALCIUM 80 MG PO TABS
80.0000 mg | ORAL_TABLET | Freq: Every day | ORAL | Status: DC
Start: 2013-11-13 — End: 2013-12-14

## 2013-11-19 ENCOUNTER — Telehealth: Payer: Self-pay

## 2013-11-19 ENCOUNTER — Encounter: Payer: Self-pay | Admitting: Cardiovascular Disease

## 2013-11-19 ENCOUNTER — Ambulatory Visit (INDEPENDENT_AMBULATORY_CARE_PROVIDER_SITE_OTHER): Payer: 59 | Admitting: Cardiovascular Disease

## 2013-11-19 VITALS — BP 138/88 | HR 62 | Ht 64.0 in | Wt 255.8 lb

## 2013-11-19 DIAGNOSIS — M25512 Pain in left shoulder: Secondary | ICD-10-CM

## 2013-11-19 DIAGNOSIS — M25519 Pain in unspecified shoulder: Secondary | ICD-10-CM

## 2013-11-19 DIAGNOSIS — I10 Essential (primary) hypertension: Secondary | ICD-10-CM

## 2013-11-19 DIAGNOSIS — E785 Hyperlipidemia, unspecified: Secondary | ICD-10-CM

## 2013-11-19 NOTE — Assessment & Plan Note (Signed)
Check fasting lipids today. I'll see him in 6 months. We'll check fasting labs again at that time.

## 2013-11-19 NOTE — Assessment & Plan Note (Signed)
Roger Mcdaniel Is doing well. His blood pressure is well controlled. He does have some orthostatic hypotension. I've encouraged him to work on a good diet, exercise, and weight loss program which we'll gently help with his orthostasis. We would likely be able to decrease his blood pressure medications if he were to lose weight and this would help with his orthostatic symptoms.

## 2013-11-19 NOTE — Telephone Encounter (Signed)
Informed patient that this information was related to issues he mentioned in this visit and that it should not effect his insurance. Patient verbalized understanding.

## 2013-11-19 NOTE — Progress Notes (Signed)
Roger Mcdaniel Date of Birth  Oct 25, 1950 [facility]     [place] [facility]  [address]    [address]   [address] [place], [place]  [address]    [place], [place]  [address] [phone]  Fax  [phone]  [phone]  Fax [phone]  Problem list: 1. Hypertension 2. Obesity 3. Obstructive sleep apnea  History of Present Illness:  Roger Mcdaniel is a 63 year old gentleman with a history of hypertension and sleep apnea. I saw him approximately one month ago for hypertension. He has been gradually titrating his medications up and he still had episodes of hypertension. He's been limiting his salt intake. He denies any chest pain or shortness of breath.  He's been taking care of his granddaughter for the past 6 months. As result he's gained a lot of weight. He's not been able to exercise.  He was tried on Pravachol. He had lots of muscle aches and pains and had to stop the Pravachol. His muscle aches and pains have improved.  Feb 09, 2013:  Doing well from a cardiac standpoint.  He had some generalized aches that lasted 3 days.  Associated with some stomach issues.  Feb. 23, 2015:  Roger Mcdaniel is feeling well. He has been having some episodes of lightheadedness and has been found to have orthostatic hypotension. One is his 42 yo cousins diet this past week of a sudden heart attack.     He has had some left shoulder / rotator cuff difficulities.   He has had some leg swelling.  He has not been exercising much.    Current Outpatient Prescriptions on File Prior to Visit  Medication Sig Dispense Refill  . atorvastatin (LIPITOR) 80 MG tablet Take 1 tablet (80 mg total) by mouth daily.  30 tablet  0  . B Complex-C-Folic Acid (STRESS B COMPLEX PO) Take by mouth daily.        . carvedilol (COREG) 6.25 MG tablet Take 1 tablet (6.25 mg total) by mouth 2 (two) times daily.  60 tablet  6  . hydrochlorothiazide (HYDRODIURIL) 25 MG tablet Take 1 tablet (25 mg total) by mouth daily.  30  tablet  4  . lisinopril (PRINIVIL,ZESTRIL) 20 MG tablet Take 1 tablet (20 mg total) by mouth daily.  30 tablet  12  . meloxicam (MOBIC) 15 MG tablet Take 15 mg by mouth daily.       . potassium chloride (K-DUR) 10 MEQ tablet Take 1 tablet (10 mEq total) by mouth daily.  30 tablet  6   No current facility-administered medications on file prior to visit.    Allergies  Allergen Reactions  . Codeine Sulfate     REACTION: nausea  . Pravachol [Pravastatin Sodium]     Muscle aches    Past Medical History  Diagnosis Date  . GERD (gastroesophageal reflux disease) 09/2003  . Hyperlipidemia 09/2003  . Hypertension 06/2004  . History of ETT 05/23/2000    Nml (Dr. Elease Hashimoto)  . Chest pain   . OSA (obstructive sleep apnea)     was on CPAP, currently off  . Prediabetes 07/10/2008  . CMC arthritis, thumb, degenerative     Past Surgical History  Procedure Laterality Date  . Orif ankle fracture  1980    right  . Septoplasty  2005    for sleep apnea //cpap    History  Smoking status  . Never Smoker   Smokeless tobacco  . Never Used    History  Alcohol Use No    Comment: rarely    Family History  Problem Relation Age of Onset  . Heart disease Father     MI x 2  . Hypertension Brother   . Obesity Brother   . Heart disease Paternal Grandfather      MI  . Cancer Other     Reviw of Systems:  Reviewed in the HPI.  All other systems are negative.  Physical Exam: Blood pressure 138/88, pulse 62, height 5\' 4"  (1.626 m), weight 255 lb 12 oz (116.007 kg). General: Well developed, well nourished, in no acute distress.  Head: Normocephalic, atraumatic, sclera non-icteric, mucus membranes are moist,   Neck: Supple. Negative for carotid bruits. JVD not elevated.  Lungs: Clear bilaterally to auscultation without wheezes, rales, or rhonchi. Breathing is unlabored.  Heart: RRR with S1 S2. No murmurs, rubs, or gallops appreciated.  Abdomen: Soft, non-tender, non-distended with  normoactive bowel sounds. He is moderately obese.  Msk:  Strength and tone appear normal for age.  Extremities: No clubbing or cyanosis. No edema.  Distal pedal pulses are 2+ and equal bilaterally.  Neuro: Alert and oriented X 3. Moves all extremities spontaneously.  Psych:  Responds to questions appropriately with a normal affect.  ECG: Feb. 23, 2015: Normal sinus rhythm with occasional premature atrial contractions. He has nonspecific ST and T wave changes. There is a possible  old inferior wall myocardial infarction.  Assessment / Plan:

## 2013-11-19 NOTE — Telephone Encounter (Signed)
Pt called and states he noticed on his AVS that it states primary reason for visit was shoulder pain, he states his primary reason was 6 month f/u. He is concerned this may make the insurance company not pay for this visit.

## 2013-11-19 NOTE — Patient Instructions (Addendum)
  [facility] Low Glycemic Diet (Source: [facility], 2006) Low Glycemic Foods (20-49) (Decrease risk of developing heart disease) Breakfast Cereals: All-Bran All-Bran Fruit 'n Oats Fiber One Oatmeal (not instant) Oat bran Fruits and fruit juices: (Limit to 1-2 servings per day) Apples Apricots (fresh & dried) Blackberries Blueberries Cherries Cranberries Peaches Pears Plums Prunes Grapefruit Raspberries Strawberries Tangerine Apple juice Grapefruit juice Tomato juice Beans and legumes (fresh-cooked): Black-eyed peas Butter beans Chick peas Lentils  Green beans Lima beans Kidney beans Navy beans Pinto beans Snow peas Non-starchy vegetables: Asparagus, avocado, broccoli, cabbage, cauliflower, celery, cucumber, greens, lettuce, mushrooms, peppers, tomatoes, okra, onions, spinach, summer squash Grains: Barley Bulgur Rye Wild rice Nuts and oils : Almonds Peanuts Sunflower seeds Hazelnuts Pecans Walnuts Oils that are liquid at room temperature Dairy, fish, meat, soy, and eggs: Milk, skim Lowfat cheese Yogurt, lowfat, fruit sugar sweetened Lean red meat Fish  Skinless chicken & Malaw[place]urkey Shellfish Egg whites (up to 3 daily) Soy products  Egg yolks (up to 7 or _____ per week) Moderate Glycemic Foods (50-69) Breakfast Cereals: Bran Buds Bran Chex Just Right Mini-Wheats  Special K Swiss muesli Fruits: Banana (under-ripe) Dates Figs Grapes Kiwi Mango Oranges Raisins Fruit Juices: Cranberry juice Orange juice Beans and legumes: Boston-type baked beans Canned pinto, kidney, or navy beans Green peas Vegetables: Beets Carrots  Sweet potato Yam Corn on the cob Breads: Pita (pocket) bread Oat bran bread Pumpernickel bread Rye bread Wheat bread, high fiber  Grains: Cornmeal Rice, brown Rice, white Couscous Pasta: Macaroni Pizza, cheese Ravioli, meat filled Spaghetti, white  Nuts: Cashews Macadamia Snacks: Chocolate Ice cream, lowfat Muffin  Popcorn High Glycemic Foods (70-100)  Breakfast Cereals: Cheerios Corn Chex Corn Flakes Cream of Wheat Grape Nuts Grape Nut Flakes Grits Nutri-Grain Puffed Rice Puffed Wheat Rice Chex Rice Krispies Shredded Wheat Team Total Fruits: Pineapple Watermelon Banana (over-ripe) Beverages: Sodas, sweet tea, pineapple juice Vegetables: Potato, baked, boiled, fried, mashed JamaicaFrench fries Canned or frozen corn Parsnips Winter squash Breads: Most breads (white and whole grain) Bagels Bread sticks Bread stuffing Kaiser roll Dinner rolls Grains: Rice, instant Tapioca, with milk Candy and most cookies Snacks: Donuts Corn chips Jelly beans Pretzels Pastries    Your physician recommends that you return for lab work in:  Today: Lipid Panel, Liver Panel, BMP 6 months: Lipid Panel, Liver Panel, BMP  Your physician wants you to follow-up in: 6 months with labs.  You will receive a reminder letter in the mail two months in advance. If you don't receive a letter, please call our office to schedule the follow-up appointment.

## 2013-11-20 ENCOUNTER — Encounter: Payer: Self-pay | Admitting: Cardiovascular Disease

## 2013-11-20 LAB — HEPATIC FUNCTION PANEL
ALT: 24 IU/L (ref 0–44)
AST: 18 IU/L (ref 0–40)
Albumin: 4.4 g/dL (ref 3.6–4.8)
Alkaline Phosphatase: 77 IU/L (ref 39–117)
BILIRUBIN DIRECT: 0.26 mg/dL (ref 0.00–0.40)
BILIRUBIN TOTAL: 0.9 mg/dL (ref 0.0–1.2)
Total Protein: 6.7 g/dL (ref 6.0–8.5)

## 2013-11-20 LAB — BASIC METABOLIC PANEL
BUN/Creatinine Ratio: 22 (ref 10–22)
BUN: 17 mg/dL (ref 8–27)
CALCIUM: 9.3 mg/dL (ref 8.6–10.2)
CHLORIDE: 97 mmol/L (ref 97–108)
CO2: 24 mmol/L (ref 18–29)
Creatinine, Ser: 0.78 mg/dL (ref 0.76–1.27)
GFR calc Af Amer: 113 mL/min/{1.73_m2} (ref >59)
GFR calc non Af Amer: 97 mL/min/{1.73_m2} (ref >59)
GLUCOSE: 146 mg/dL — AB (ref 65–99)
POTASSIUM: 4.5 mmol/L (ref 3.5–5.2)
Sodium: 137 mmol/L (ref 134–144)

## 2013-11-20 LAB — LIPID PANEL
CHOL/HDL RATIO: 3 ratio (ref 0.0–5.0)
Cholesterol, Total: 112 mg/dL (ref 100–199)
HDL: 37 mg/dL — ABNORMAL LOW (ref >39)
LDL Calculated: 54 mg/dL (ref 0–99)
Triglycerides: 104 mg/dL (ref 0–149)
VLDL CHOLESTEROL CAL: 21 mg/dL (ref 5–40)

## 2013-11-21 MED ORDER — LISINOPRIL 20 MG PO TABS: 20.0000 mg | ORAL_TABLET | Freq: Every day | ORAL | Status: DC

## 2013-11-21 NOTE — Telephone Encounter (Signed)
Refill request done. 

## 2013-11-21 NOTE — Telephone Encounter (Signed)
Informed patient that per Dr. Elease HashimotoNahser his labs are ok.

## 2013-12-14 ENCOUNTER — Other Ambulatory Visit: Payer: Self-pay | Admitting: *Deleted

## 2013-12-14 MED ORDER — ATORVASTATIN CALCIUM 80 MG PO TABS: 80.0000 mg | ORAL_TABLET | Freq: Every day | ORAL | Status: DC

## 2013-12-19 ENCOUNTER — Telehealth: Payer: Self-pay

## 2013-12-19 NOTE — Telephone Encounter (Signed)
Will see then. 

## 2013-12-19 NOTE — Telephone Encounter (Signed)
Pt left v/m requesting MRI due to neck pain at top of spine which started one month ago; no known injury,no h/a, no blurred vision or dizziness and no chest pain. pt has been seeing chiropractor. Chiropractor advised pt to get MRI. Advised pt would need appt before ordering MRI; pt has CPX scheduled 01/23/14. Pt scheduled appt with Dr Reece AgarG on 12/20/13 at 1 pm. Advised pt if condition changes or worsens prior to appt to go to[facility]; pt voiced understanding.

## 2013-12-20 ENCOUNTER — Ambulatory Visit (INDEPENDENT_AMBULATORY_CARE_PROVIDER_SITE_OTHER): Payer: 59 | Admitting: Family Medicine

## 2013-12-20 ENCOUNTER — Encounter: Payer: Self-pay | Admitting: Family Medicine

## 2013-12-20 VITALS — BP 122/68 | HR 71 | Temp 98.3°F | Wt 255.0 lb

## 2013-12-20 DIAGNOSIS — M5020 Other cervical disc displacement, unspecified cervical region: Secondary | ICD-10-CM | POA: Insufficient documentation

## 2013-12-20 DIAGNOSIS — M542 Cervicalgia: Secondary | ICD-10-CM

## 2013-12-20 HISTORY — DX: Other cervical disc displacement, unspecified cervical region: M50.20

## 2013-12-20 MED ORDER — PREDNISONE 20 MG PO TABS: ORAL_TABLET | ORAL | Status: DC

## 2013-12-20 MED ORDER — METHOCARBAMOL 500 MG PO TABS: 500.0000 mg | ORAL_TABLET | Freq: Three times a day (TID) | ORAL | Status: DC | PRN

## 2013-12-20 NOTE — Progress Notes (Signed)
Pre visit review using our clinic review tool, if applicable. No additional management support is needed unless otherwise documented below in the visit note. 

## 2013-12-20 NOTE — Patient Instructions (Signed)
I think you do have cervical radiculopathy or pinching of nerve as it 's coming down spine - but I also think you may have tendonitis of rotator cuff on left. Treat with prednisone course as well as robaxin muscle relaxant. May continue heating pad as well. If not improving with above, keep appointment with neurosurgeon. Update me after prednisone course with how you're doing - we may prescribe other pain medicine. Good to see you today, call us with questions.

## 2013-12-20 NOTE — Progress Notes (Signed)
BP 122/68  Pulse 71  Temp(Src) 98.3 F (36.8 C) (Oral)  Wt 255 lb (115.667 kg)  SpO2 97%   CC: neck pain  Subjective:    Patient ID: Roger Mcdaniel, male    DOB: [birth year], 63 y.o.   MRN: [id number]  HPI: Roger Mcdaniel is a 63 y.o. male presenting on 12/20/2013 for Neck Pain   Right handed. 1 mo h/o L neck and shoulder pain.  Denies inciting trauma, injury, heavy lifting.  No h/o neck pain in past.  Starts in midline spine and radiates to L shoulder and occasionally down arm to wrist.  No tingling or numbness or weakness of left arm/hand.  Trouble sleeping 2/2 pain.  Last weekend had severe 20/10 neck pain.  Has been seeing chiropractor Dr. Nyra Capes in [place] who recommended MRI.  xrays obtained - told had some twisting of neck along with pinched nerve.  Has had 5 visits with chiropractor.  Relieved with hot shower but then pain returns.  Also taking aleve PM which helps.  Last night 2 aleve PM didn't help.  Took wife's vicodin which didn't help either.  Ice/heat didn't help.  capsacin helps some.  Brings cd with xray from 12/10/2013 from chiropractor - mild arthritis lower cervical vertebrae Has scheduled appt 01/04/2014 to see NSG Dr. Jule Ser.  Past Medical History  Diagnosis Date  . GERD (gastroesophageal reflux disease) 09/2003  . Hyperlipidemia 09/2003  . Hypertension 06/2004  . History of ETT 05/23/2000    Nml (Dr. Elease Hashimoto)  . Chest pain   . OSA (obstructive sleep apnea)     was on CPAP, currently off  . Prediabetes 07/10/2008  . CMC arthritis, thumb, degenerative      Relevant past medical, surgical, family and social history reviewed and updated as indicated.  Allergies and medications reviewed and updated. Current Outpatient Prescriptions on File Prior to Visit  Medication Sig  . aspirin 81 MG tablet Take 81 mg by mouth daily.  [name] atorvastatin (LIPITOR) 80 MG tablet Take 1 tablet (80 mg total) by mouth daily.  . B Complex-C-Folic Acid (STRESS B COMPLEX PO) Take  by mouth daily.    . carvedilol (COREG) 6.25 MG tablet Take 1 tablet (6.25 mg total) by mouth 2 (two) times daily.  . hydrochlorothiazide (HYDRODIURIL) 25 MG tablet Take 1 tablet (25 mg total) by mouth daily.  [name] lisinopril (PRINIVIL,ZESTRIL) 20 MG tablet Take 1 tablet (20 mg total) by mouth daily.  . Magnesium Salicylate 600 MG TABS Take by mouth daily.  . meloxicam (MOBIC) 15 MG tablet Take 15 mg by mouth daily.   . Omega-3 Fatty Acids (FISH OIL) 600 MG CAPS Take by mouth daily.  . potassium chloride (K-DUR) 10 MEQ tablet Take 1 tablet (10 mEq total) by mouth daily.  . Red Yeast Rice 600 MG CAPS Take by mouth 2 (two) times daily.  [name] zinc gluconate 50 MG tablet Take 50 mg by mouth daily.   No current facility-administered medications on file prior to visit.    Review of Systems Per HPI unless specifically indicated above    Objective:    BP 122/68  Pulse 71  Temp(Src) 98.3 F (36.8 C) (Oral)  Wt 255 lb (115.667 kg)  SpO2 97%  Physical Exam  Nursing note and vitals reviewed. Constitutional: He appears well-developed and well-nourished. No distress.  Musculoskeletal: He exhibits no edema.  Limited ROM of neck to right (lateral rotation and flexion) Tender to palpation midline spine around C6-T4 with radiation  of pain laterally to left. Neg spurling  R shoulder - WNL except for decreased ROM noted. L Shoulder exam: No deformity of shoulders on inspection but carries L shoulder raised 2/2 pain. No pain with palpation of shoulder landmarks. FROM in abduction and forward flexion. Weakness with testing SITS in ext rotation. Weakness with empty can sign. No impingement. No pain with rotation of humeral head in [facility] joint.  Neurological: He is alert. He has normal strength. No sensory deficit.  Reflex Scores:      Bicep reflexes are 2+ on the right side and 2+ on the left side.      Brachioradialis reflexes are 2+ on the right side and 2+ on the left side. 5/5 strength  BUE sensation to light touch and temperature intact       Assessment & Plan:   Problem List Items Addressed This Visit   Neck pain on left side - Primary     Anticipate L cervical radiculopathy with some weakness noted on testing L RTC - ? RTC injury vs radiculopathy related. Has not improved with chiropractic treatment. Will do trial of supportive care to include prednisone course and muscle relaxant.  Discussed steroid precautions and sedation precautions on muscle relaxant.  Advised stopping mobic while on prednisone. If not improving with conservative treatment, rec keep appt with neurosurgery in 2 weeks. Discussed MRI - will hold for now in hopes of improvement with steroid course.        Follow up plan: Return if symptoms worsen or fail to improve.

## 2013-12-20 NOTE — Assessment & Plan Note (Addendum)
Anticipate L cervical radiculopathy with some weakness noted on testing L RTC - ? RTC injury vs radiculopathy related. Has not improved with chiropractic treatment. Will do trial of supportive care to include prednisone course and muscle relaxant.  Discussed steroid precautions and sedation precautions on muscle relaxant.  Advised stopping mobic while on prednisone. If not improving with conservative treatment, rec keep appt with neurosurgery in 2 weeks. Discussed MRI - will hold for now in hopes of improvement with steroid course.

## 2013-12-24 ENCOUNTER — Other Ambulatory Visit: Payer: Self-pay | Admitting: *Deleted

## 2013-12-24 MED ORDER — HYDROCHLOROTHIAZIDE 25 MG PO TABS: 25.0000 mg | ORAL_TABLET | Freq: Every day | ORAL | Status: DC

## 2013-12-28 ENCOUNTER — Encounter: Payer: Self-pay | Admitting: Family Medicine

## 2013-12-29 MED ORDER — TRAMADOL HCL 50 MG PO TABS: 50.0000 mg | ORAL_TABLET | Freq: Two times a day (BID) | ORAL | Status: DC | PRN

## 2013-12-29 NOTE — Telephone Encounter (Signed)
plz phone in tramadol prescription on Monday.

## 2014-01-09 ENCOUNTER — Encounter (HOSPITAL_COMMUNITY): Payer: Self-pay | Admitting: Pharmacy Technician

## 2014-01-09 ENCOUNTER — Other Ambulatory Visit: Payer: Self-pay | Admitting: Neurosurgery

## 2014-01-10 ENCOUNTER — Encounter (HOSPITAL_COMMUNITY)
Admission: RE | Admit: 2014-01-10 | Discharge: 2014-01-10 | Disposition: A | Discharge status: Home / Self Care | Payer: 59 | Source: Ambulatory Visit | Attending: Neurosurgery | Admitting: Neurosurgery

## 2014-01-10 ENCOUNTER — Encounter (HOSPITAL_COMMUNITY): Payer: Self-pay

## 2014-01-10 ENCOUNTER — Other Ambulatory Visit (HOSPITAL_COMMUNITY): Payer: Self-pay | Admitting: *Deleted

## 2014-01-10 ENCOUNTER — Ambulatory Visit (HOSPITAL_COMMUNITY)
Admission: RE | Admit: 2014-01-10 | Discharge: 2014-01-10 | Disposition: A | Discharge status: Home / Self Care | Payer: 59 | Source: Ambulatory Visit | Attending: Anesthesiology | Admitting: Anesthesiology

## 2014-01-10 DIAGNOSIS — Z01818 Encounter for other preprocedural examination: Secondary | ICD-10-CM | POA: Insufficient documentation

## 2014-01-10 DIAGNOSIS — Z01812 Encounter for preprocedural laboratory examination: Secondary | ICD-10-CM | POA: Insufficient documentation

## 2014-01-10 HISTORY — DX: Calculus of kidney: N20.0

## 2014-01-10 HISTORY — DX: Xerosis cutis: L85.3

## 2014-01-10 LAB — BASIC METABOLIC PANEL
BUN: 15 mg/dL (ref 6–23)
CO2: 25 mEq/L (ref 19–32)
CREATININE: 0.81 mg/dL (ref 0.50–1.35)
Calcium: 9.7 mg/dL (ref 8.4–10.5)
Chloride: 98 mEq/L (ref 96–112)
GFR calc Af Amer: >90 mL/min (ref >90)
GFR calc non Af Amer: >90 mL/min (ref >90)
Glucose, Bld: 115 mg/dL — ABNORMAL HIGH (ref 70–99)
Potassium: 4.4 mEq/L (ref 3.7–5.3)
SODIUM: 137 meq/L (ref 137–147)

## 2014-01-10 LAB — SURGICAL PCR SCREEN
MRSA, PCR: NEGATIVE
STAPHYLOCOCCUS AUREUS: POSITIVE — AB

## 2014-01-10 LAB — CBC
HEMATOCRIT: 40.9 % (ref 39.0–52.0)
Hemoglobin: 14 g/dL (ref 13.0–17.0)
MCH: 30.2 pg (ref 26.0–34.0)
MCHC: 34.2 g/dL (ref 30.0–36.0)
MCV: 88.1 fL (ref 78.0–100.0)
Platelets: 275 10*3/uL (ref 150–400)
RBC: 4.64 MIL/uL (ref 4.22–5.81)
RDW: 12.9 % (ref 11.5–15.5)
WBC: 9.2 10*3/uL (ref 4.0–10.5)

## 2014-01-10 NOTE — Progress Notes (Signed)
Pt's PCP is Dr. Sharen HonesGutierrez. Pt's cardiologist is Dr. Elease HashimotoNahser, saw him in February.

## 2014-01-10 NOTE — Pre-Procedure Instructions (Signed)
Jackelyn PolingBobby J Fournet  01/10/2014   Your procedure is scheduled on:  Monday, January 14, 2014 at 7:30 AM.   [facility] at 5:30 AM.   Call this number if you have problems the morning of surgery: [phone]   Remember:   Do not eat food or drink liquids after midnight Sunday, 01/13/14.   Take these medicines the morning of surgery with A SIP OF WATER: carvedilol (COREG), traMADol (ULTRAM) - if needed.  Stop Vitamins, Herbal Medications, Fish Oil, Aspirin, Mobic and Relafen as of today. Do not take any Aspirin products or NSAIDS (Ibuprofen, Aleve, etc) prior to surgery.    Do not wear jewelry.  Do not wear lotions, powders, or cologne. You may wear deodorant.  Men may shave face and neck.  Do not bring valuables to the hospital.  [facility] is not responsible                  for any belongings or valuables.               Contacts, dentures or bridgework may not be worn into surgery.  Leave suitcase in the car. After surgery it may be brought to your room.  For patients admitted to the hospital, discharge time is determined by your                treatment team.           Special Instructions: [place] - Preparing for Surgery  Before surgery, you can play an important role.  Because skin is not sterile, your skin needs to be as free of germs as possible.  You can reduce the number of germs on you skin by washing with CHG (chlorahexidine gluconate) soap before surgery.  CHG is an antiseptic cleaner which kills germs and bonds with the skin to continue killing germs even after washing.  Please DO NOT use if you have an allergy to CHG or antibacterial soaps.  If your skin becomes reddened/irritated stop using the CHG and inform your nurse when you arrive at Short Stay.  Do not shave (including legs and underarms) for at least 48 hours prior to the first CHG shower.  You may shave your face.  Please follow these instructions carefully:   1.   Shower with CHG Soap the night before surgery and the                                morning of Surgery.  2.  If you choose to wash your hair, wash your hair first as usual with your       normal shampoo.  3.  After you shampoo, rinse your hair and body thoroughly to remove the                      Shampoo.  4.  Use CHG as you would any other liquid soap.  You can apply chg directly       to the skin and wash gently with scrungie or a clean washcloth.  5.  Apply the CHG Soap to your body ONLY FROM THE NECK DOWN.        Do not use on open wounds or open sores.  Avoid contact with your eyes, ears, mouth and genitals (private parts).  Wash genitals (private parts) with your normal soap.  6.  Wash thoroughly,  paying special attention to the area where your surgery        will be performed.  7.  Thoroughly rinse your body with warm water from the neck down.  8.  DO NOT shower/wash with your normal soap after using and rinsing off       the CHG Soap.  9.  Pat yourself dry with a clean towel.            10.  Wear clean pajamas.            11.  Place clean sheets on your bed the night of your first shower and do not        sleep with pets.  Day of Surgery  Do not apply any lotions the morning of surgery.  Please wear clean clothes to the hospital/surgery center.     Please read over the following fact sheets that you were given: Pain Booklet, Coughing and Deep Breathing, MRSA Information and Surgical Site Infection Prevention

## 2014-01-10 NOTE — Progress Notes (Signed)
Pt notified of positive PCR of staph. Rx for Mupirocin 2% ointment called into [facility] in [place]. Pt instructed to use as directed. Pt voiced understanding.

## 2014-01-13 MED ORDER — CEFAZOLIN SODIUM-DEXTROSE 2-3 GM-% IV SOLR
2.0000 g | INTRAVENOUS | Status: AC
  Administered 2014-01-14: 2 g via INTRAVENOUS
  Filled 2014-01-13: qty 50

## 2014-01-14 ENCOUNTER — Ambulatory Visit (HOSPITAL_COMMUNITY): Payer: 59 | Admitting: Critical Care Medicine

## 2014-01-14 ENCOUNTER — Encounter (HOSPITAL_COMMUNITY): Payer: Self-pay | Admitting: *Deleted

## 2014-01-14 ENCOUNTER — Encounter (HOSPITAL_COMMUNITY)
Admission: RE | Disposition: A | Discharge status: Home / Self Care | Payer: Self-pay | Source: Ambulatory Visit | Attending: Neurosurgery

## 2014-01-14 ENCOUNTER — Ambulatory Visit (HOSPITAL_COMMUNITY): Payer: 59

## 2014-01-14 ENCOUNTER — Ambulatory Visit (HOSPITAL_COMMUNITY)
Admission: RE | Admit: 2014-01-14 | Discharge: 2014-01-15 | Disposition: A | Discharge status: Home / Self Care | Payer: 59 | Source: Ambulatory Visit | Attending: Neurosurgery | Admitting: Neurosurgery

## 2014-01-14 ENCOUNTER — Encounter (HOSPITAL_COMMUNITY): Payer: 59 | Admitting: Critical Care Medicine

## 2014-01-14 DIAGNOSIS — K219 Gastro-esophageal reflux disease without esophagitis: Secondary | ICD-10-CM | POA: Insufficient documentation

## 2014-01-14 DIAGNOSIS — I10 Essential (primary) hypertension: Secondary | ICD-10-CM | POA: Insufficient documentation

## 2014-01-14 DIAGNOSIS — M47812 Spondylosis without myelopathy or radiculopathy, cervical region: Secondary | ICD-10-CM | POA: Insufficient documentation

## 2014-01-14 DIAGNOSIS — Z7982 Long term (current) use of aspirin: Secondary | ICD-10-CM | POA: Insufficient documentation

## 2014-01-14 DIAGNOSIS — M5020 Other cervical disc displacement, unspecified cervical region: Secondary | ICD-10-CM | POA: Insufficient documentation

## 2014-01-14 DIAGNOSIS — R7309 Other abnormal glucose: Secondary | ICD-10-CM | POA: Insufficient documentation

## 2014-01-14 DIAGNOSIS — E785 Hyperlipidemia, unspecified: Secondary | ICD-10-CM | POA: Insufficient documentation

## 2014-01-14 DIAGNOSIS — M5030 Other cervical disc degeneration, unspecified cervical region: Secondary | ICD-10-CM | POA: Insufficient documentation

## 2014-01-14 DIAGNOSIS — G4733 Obstructive sleep apnea (adult) (pediatric): Secondary | ICD-10-CM | POA: Insufficient documentation

## 2014-01-14 HISTORY — PX: ANTERIOR CERVICAL DECOMP/DISCECTOMY FUSION: SHX1161

## 2014-01-14 SURGERY — ANTERIOR CERVICAL DECOMPRESSION/DISCECTOMY FUSION 2 LEVELS
Anesthesia: General

## 2014-01-14 MED ORDER — ACETAMINOPHEN 10 MG/ML IV SOLN
INTRAVENOUS | Status: AC
  Administered 2014-01-14: 1000 mg via INTRAVENOUS
  Filled 2014-01-14: qty 100

## 2014-01-14 MED ORDER — KETOROLAC TROMETHAMINE 30 MG/ML IJ SOLN
INTRAMUSCULAR | Status: AC
  Filled 2014-01-14: qty 1

## 2014-01-14 MED ORDER — BACITRACIN 50000 UNITS IM SOLR
INTRAMUSCULAR | Status: DC | PRN
  Administered 2014-01-14: 08:00:00

## 2014-01-14 MED ORDER — SODIUM CHLORIDE 0.9 % IJ SOLN
INTRAMUSCULAR | Status: AC
  Filled 2014-01-14: qty 10

## 2014-01-14 MED ORDER — SODIUM CHLORIDE 0.9 % IJ SOLN
3.0000 mL | Freq: Two times a day (BID) | INTRAMUSCULAR | Status: DC
  Administered 2014-01-14: 3 mL via INTRAVENOUS

## 2014-01-14 MED ORDER — CARVEDILOL 6.25 MG PO TABS
6.2500 mg | ORAL_TABLET | Freq: Two times a day (BID) | ORAL | Status: DC
  Filled 2014-01-14 (×4): qty 1

## 2014-01-14 MED ORDER — GLYCOPYRROLATE 0.2 MG/ML IJ SOLN
INTRAMUSCULAR | Status: DC | PRN
  Administered 2014-01-14: 0.1 mg via INTRAVENOUS
  Administered 2014-01-14: 0.4 mg via INTRAVENOUS
  Administered 2014-01-14: 0.1 mg via INTRAVENOUS

## 2014-01-14 MED ORDER — PHENYLEPHRINE HCL 10 MG/ML IJ SOLN
INTRAMUSCULAR | Status: DC | PRN
  Administered 2014-01-14 (×3): 80 ug via INTRAVENOUS

## 2014-01-14 MED ORDER — DEXAMETHASONE SODIUM PHOSPHATE 4 MG/ML IJ SOLN
INTRAMUSCULAR | Status: AC
  Filled 2014-01-14: qty 1

## 2014-01-14 MED ORDER — MORPHINE SULFATE 4 MG/ML IJ SOLN: 4.0000 mg | INTRAMUSCULAR | Status: DC | PRN

## 2014-01-14 MED ORDER — THROMBIN 5000 UNITS EX SOLR
OROMUCOSAL | Status: DC | PRN
  Administered 2014-01-14: 10:00:00 via TOPICAL

## 2014-01-14 MED ORDER — ACETAMINOPHEN 650 MG RE SUPP: 650.0000 mg | RECTAL | Status: DC | PRN

## 2014-01-14 MED ORDER — MIDAZOLAM HCL 5 MG/5ML IJ SOLN
INTRAMUSCULAR | Status: DC | PRN
  Administered 2014-01-14: 2 mg via INTRAVENOUS

## 2014-01-14 MED ORDER — MENTHOL 3 MG MT LOZG: 1.0000 | LOZENGE | OROMUCOSAL | Status: DC | PRN

## 2014-01-14 MED ORDER — LIDOCAINE-EPINEPHRINE 1 %-1:100000 IJ SOLN
INTRAMUSCULAR | Status: DC | PRN
  Administered 2014-01-14: 11 mL

## 2014-01-14 MED ORDER — SUCCINYLCHOLINE CHLORIDE 20 MG/ML IJ SOLN
INTRAMUSCULAR | Status: DC | PRN
  Administered 2014-01-14: 120 mg via INTRAVENOUS

## 2014-01-14 MED ORDER — ONDANSETRON HCL 4 MG/2ML IJ SOLN
INTRAMUSCULAR | Status: DC | PRN
  Administered 2014-01-14: 4 mg via INTRAVENOUS

## 2014-01-14 MED ORDER — FENTANYL CITRATE 0.05 MG/ML IJ SOLN
INTRAMUSCULAR | Status: AC
  Filled 2014-01-14: qty 5

## 2014-01-14 MED ORDER — CYCLOBENZAPRINE HCL 10 MG PO TABS: 10.0000 mg | ORAL_TABLET | Freq: Three times a day (TID) | ORAL | Status: DC | PRN

## 2014-01-14 MED ORDER — SODIUM CHLORIDE 0.9 % IJ SOLN: 3.0000 mL | INTRAMUSCULAR | Status: DC | PRN

## 2014-01-14 MED ORDER — HEMOSTATIC AGENTS (NO CHARGE) OPTIME
TOPICAL | Status: DC | PRN
  Administered 2014-01-14: 1 via TOPICAL

## 2014-01-14 MED ORDER — CEFAZOLIN SODIUM 1-5 GM-% IV SOLN
INTRAVENOUS | Status: DC | PRN
  Administered 2014-01-14: 1 g via INTRAVENOUS

## 2014-01-14 MED ORDER — CEFAZOLIN SODIUM 1-5 GM-% IV SOLN
INTRAVENOUS | Status: AC
  Filled 2014-01-14: qty 50

## 2014-01-14 MED ORDER — OXYCODONE HCL 5 MG/5ML PO SOLN: 5.0000 mg | Freq: Once | ORAL | Status: DC | PRN

## 2014-01-14 MED ORDER — KETOROLAC TROMETHAMINE 30 MG/ML IJ SOLN
30.0000 mg | Freq: Four times a day (QID) | INTRAMUSCULAR | Status: DC
  Administered 2014-01-14 – 2014-01-15 (×3): 30 mg via INTRAVENOUS
  Filled 2014-01-14 (×7): qty 1

## 2014-01-14 MED ORDER — PROMETHAZINE HCL 25 MG/ML IJ SOLN: 6.2500 mg | INTRAMUSCULAR | Status: DC | PRN

## 2014-01-14 MED ORDER — PHENYLEPHRINE HCL 10 MG/ML IJ SOLN
10.0000 mg | INTRAVENOUS | Status: DC | PRN
  Administered 2014-01-14: 25 ug/min via INTRAVENOUS

## 2014-01-14 MED ORDER — PHENYLEPHRINE 40 MCG/ML (10ML) SYRINGE FOR IV PUSH (FOR BLOOD PRESSURE SUPPORT)
PREFILLED_SYRINGE | INTRAVENOUS | Status: AC
  Filled 2014-01-14: qty 10

## 2014-01-14 MED ORDER — OXYCODONE HCL 5 MG PO TABS: 5.0000 mg | ORAL_TABLET | Freq: Once | ORAL | Status: DC | PRN

## 2014-01-14 MED ORDER — HYDROMORPHONE HCL PF 1 MG/ML IJ SOLN
0.2500 mg | INTRAMUSCULAR | Status: DC | PRN
  Administered 2014-01-14 (×2): 0.5 mg via INTRAVENOUS

## 2014-01-14 MED ORDER — LIDOCAINE HCL (CARDIAC) 20 MG/ML IV SOLN
INTRAVENOUS | Status: DC | PRN
  Administered 2014-01-14: 100 mg via INTRAVENOUS

## 2014-01-14 MED ORDER — MIDAZOLAM HCL 2 MG/2ML IJ SOLN
INTRAMUSCULAR | Status: AC
  Filled 2014-01-14: qty 2

## 2014-01-14 MED ORDER — SUCCINYLCHOLINE CHLORIDE 20 MG/ML IJ SOLN
INTRAMUSCULAR | Status: AC
  Filled 2014-01-14: qty 1

## 2014-01-14 MED ORDER — DEXAMETHASONE SODIUM PHOSPHATE 4 MG/ML IJ SOLN: INTRAMUSCULAR | Status: DC | PRN

## 2014-01-14 MED ORDER — HYDROXYZINE HCL 25 MG PO TABS: 50.0000 mg | ORAL_TABLET | ORAL | Status: DC | PRN

## 2014-01-14 MED ORDER — ONDANSETRON HCL 4 MG/2ML IJ SOLN: 4.0000 mg | Freq: Four times a day (QID) | INTRAMUSCULAR | Status: DC | PRN

## 2014-01-14 MED ORDER — HYDROCODONE-ACETAMINOPHEN 5-325 MG PO TABS: 1.0000 | ORAL_TABLET | ORAL | Status: DC | PRN

## 2014-01-14 MED ORDER — ARTIFICIAL TEARS OP OINT
TOPICAL_OINTMENT | OPHTHALMIC | Status: AC
  Filled 2014-01-14: qty 3.5

## 2014-01-14 MED ORDER — PHENOL 1.4 % MT LIQD: 1.0000 | OROMUCOSAL | Status: DC | PRN

## 2014-01-14 MED ORDER — FENTANYL CITRATE 0.05 MG/ML IJ SOLN
INTRAMUSCULAR | Status: DC | PRN
  Administered 2014-01-14 (×2): 50 ug via INTRAVENOUS
  Administered 2014-01-14: 100 ug via INTRAVENOUS
  Administered 2014-01-14: 50 ug via INTRAVENOUS

## 2014-01-14 MED ORDER — NEOSTIGMINE METHYLSULFATE 1 MG/ML IJ SOLN
INTRAMUSCULAR | Status: DC | PRN
  Administered 2014-01-14: 3 mg via INTRAVENOUS

## 2014-01-14 MED ORDER — SODIUM CHLORIDE 0.9 % IV SOLN: 250.0000 mL | INTRAVENOUS | Status: DC

## 2014-01-14 MED ORDER — DEXAMETHASONE SODIUM PHOSPHATE 4 MG/ML IJ SOLN
INTRAMUSCULAR | Status: DC | PRN
  Administered 2014-01-14: 8 mg via INTRAVENOUS

## 2014-01-14 MED ORDER — NEOSTIGMINE METHYLSULFATE 1 MG/ML IJ SOLN
INTRAMUSCULAR | Status: AC
  Filled 2014-01-14: qty 10

## 2014-01-14 MED ORDER — BISACODYL 10 MG RE SUPP: 10.0000 mg | Freq: Every day | RECTAL | Status: DC | PRN

## 2014-01-14 MED ORDER — MUPIROCIN 2 % EX OINT
1.0000 "application " | TOPICAL_OINTMENT | Freq: Two times a day (BID) | CUTANEOUS | Status: DC
  Administered 2014-01-14: 1 via NASAL

## 2014-01-14 MED ORDER — ATORVASTATIN CALCIUM 80 MG PO TABS
80.0000 mg | ORAL_TABLET | Freq: Every day | ORAL | Status: DC
  Filled 2014-01-14 (×2): qty 1

## 2014-01-14 MED ORDER — ROCURONIUM BROMIDE 50 MG/5ML IV SOLN
INTRAVENOUS | Status: AC
  Filled 2014-01-14: qty 1

## 2014-01-14 MED ORDER — GLYCOPYRROLATE 0.2 MG/ML IJ SOLN
INTRAMUSCULAR | Status: AC
  Filled 2014-01-14: qty 3

## 2014-01-14 MED ORDER — LACTATED RINGERS IV SOLN
INTRAVENOUS | Status: DC | PRN
  Administered 2014-01-14 (×2): via INTRAVENOUS

## 2014-01-14 MED ORDER — OXYCODONE-ACETAMINOPHEN 5-325 MG PO TABS: 1.0000 | ORAL_TABLET | ORAL | Status: DC | PRN

## 2014-01-14 MED ORDER — LISINOPRIL 20 MG PO TABS
20.0000 mg | ORAL_TABLET | Freq: Every day | ORAL | Status: DC
  Filled 2014-01-14 (×2): qty 1

## 2014-01-14 MED ORDER — KETOROLAC TROMETHAMINE 30 MG/ML IJ SOLN
30.0000 mg | Freq: Once | INTRAMUSCULAR | Status: AC
  Administered 2014-01-14: 30 mg via INTRAVENOUS

## 2014-01-14 MED ORDER — ACETAMINOPHEN 325 MG PO TABS: 650.0000 mg | ORAL_TABLET | ORAL | Status: DC | PRN

## 2014-01-14 MED ORDER — BUPIVACAINE HCL (PF) 0.25 % IJ SOLN
INTRAMUSCULAR | Status: DC | PRN
  Administered 2014-01-14: 11 mL

## 2014-01-14 MED ORDER — PROPOFOL 10 MG/ML IV BOLUS
INTRAVENOUS | Status: DC | PRN
  Administered 2014-01-14: 20 mg via INTRAVENOUS
  Administered 2014-01-14: 200 mg via INTRAVENOUS

## 2014-01-14 MED ORDER — HYDROMORPHONE HCL PF 1 MG/ML IJ SOLN
INTRAMUSCULAR | Status: AC
  Filled 2014-01-14: qty 1

## 2014-01-14 MED ORDER — ZOLPIDEM TARTRATE 5 MG PO TABS: 10.0000 mg | ORAL_TABLET | Freq: Every evening | ORAL | Status: DC | PRN

## 2014-01-14 MED ORDER — ROCURONIUM BROMIDE 100 MG/10ML IV SOLN
INTRAVENOUS | Status: DC | PRN
  Administered 2014-01-14: 30 mg via INTRAVENOUS
  Administered 2014-01-14: 10 mg via INTRAVENOUS

## 2014-01-14 MED ORDER — PROPOFOL 10 MG/ML IV BOLUS
INTRAVENOUS | Status: AC
  Filled 2014-01-14: qty 20

## 2014-01-14 MED ORDER — MAGNESIUM HYDROXIDE 400 MG/5ML PO SUSP: 30.0000 mL | Freq: Every day | ORAL | Status: DC | PRN

## 2014-01-14 MED ORDER — THROMBIN 5000 UNITS EX SOLR
CUTANEOUS | Status: DC | PRN
  Administered 2014-01-14 (×2): 5000 [IU] via TOPICAL

## 2014-01-14 MED ORDER — ARTIFICIAL TEARS OP OINT
TOPICAL_OINTMENT | OPHTHALMIC | Status: DC | PRN
  Administered 2014-01-14: 1 via OPHTHALMIC

## 2014-01-14 MED ORDER — ONDANSETRON HCL 4 MG/2ML IJ SOLN
INTRAMUSCULAR | Status: AC
  Filled 2014-01-14: qty 2

## 2014-01-14 MED ORDER — KCL IN DEXTROSE-NACL 20-5-0.45 MEQ/L-%-% IV SOLN
INTRAVENOUS | Status: DC
  Filled 2014-01-14 (×4): qty 1000

## 2014-01-14 MED ORDER — 0.9 % SODIUM CHLORIDE (POUR BTL) OPTIME
TOPICAL | Status: DC | PRN
  Administered 2014-01-14: 1000 mL

## 2014-01-14 MED ORDER — EPHEDRINE SULFATE 50 MG/ML IJ SOLN
INTRAMUSCULAR | Status: AC
Start: 2014-01-14 — End: 2014-01-14
  Filled 2014-01-14: qty 1

## 2014-01-14 MED ORDER — LIDOCAINE HCL (CARDIAC) 20 MG/ML IV SOLN
INTRAVENOUS | Status: AC
Start: 2014-01-14 — End: 2014-01-14
  Filled 2014-01-14: qty 5

## 2014-01-14 MED ORDER — HYDROCHLOROTHIAZIDE 25 MG PO TABS
25.0000 mg | ORAL_TABLET | Freq: Every day | ORAL | Status: DC
  Filled 2014-01-14 (×2): qty 1

## 2014-01-14 MED ORDER — ALUM & MAG HYDROXIDE-SIMETH 200-200-20 MG/5ML PO SUSP: 30.0000 mL | Freq: Four times a day (QID) | ORAL | Status: DC | PRN

## 2014-01-14 SURGICAL SUPPLY — 63 items
ALLOGRAFT 7X14X11 (Bone Implant) ×4 IMPLANT
BAG DECANTER FOR FLEXI CONT (MISCELLANEOUS) ×2 IMPLANT
BIT DRILL NEURO 2X3.1 SFT TUCH (MISCELLANEOUS) ×1 IMPLANT
BLADE ULTRA TIP 2M (BLADE) ×2 IMPLANT
BRUSH SCRUB EZ PLAIN DRY (MISCELLANEOUS) ×2 IMPLANT
CANISTER SUCT 3000ML (MISCELLANEOUS) ×2 IMPLANT
CONT SPEC 4OZ CLIKSEAL STRL BL (MISCELLANEOUS) ×2 IMPLANT
COVER MAYO STAND STRL (DRAPES) ×2 IMPLANT
DECANTER SPIKE VIAL GLASS SM (MISCELLANEOUS) ×2 IMPLANT
DERMABOND ADVANCED (GAUZE/BANDAGES/DRESSINGS) ×1
DERMABOND ADVANCED .7 DNX12 (GAUZE/BANDAGES/DRESSINGS) ×1 IMPLANT
DRAPE LAPAROTOMY 100X72 PEDS (DRAPES) ×2 IMPLANT
DRAPE MICROSCOPE LEICA (MISCELLANEOUS) ×2 IMPLANT
DRAPE POUCH INSTRU U-SHP 10X18 (DRAPES) ×2 IMPLANT
DRAPE PROXIMA HALF (DRAPES) IMPLANT
DRILL NEURO 2X3.1 SOFT TOUCH (MISCELLANEOUS) ×2
ELECT COATED BLADE 2.86 ST (ELECTRODE) ×2 IMPLANT
ELECT REM PT RETURN 9FT ADLT (ELECTROSURGICAL) ×2
ELECTRODE REM PT RTRN 9FT ADLT (ELECTROSURGICAL) ×1 IMPLANT
GLOVE BIOGEL PI IND STRL 8 (GLOVE) ×1 IMPLANT
GLOVE BIOGEL PI IND STRL 8.5 (GLOVE) ×1 IMPLANT
GLOVE BIOGEL PI INDICATOR 8 (GLOVE) ×1
GLOVE BIOGEL PI INDICATOR 8.5 (GLOVE) ×1
GLOVE ECLIPSE 7.5 STRL STRAW (GLOVE) ×2 IMPLANT
GLOVE ECLIPSE 8.5 STRL (GLOVE) ×2 IMPLANT
GLOVE EXAM NITRILE LRG STRL (GLOVE) IMPLANT
GLOVE EXAM NITRILE MD LF STRL (GLOVE) IMPLANT
GLOVE EXAM NITRILE XL STR (GLOVE) IMPLANT
GLOVE EXAM NITRILE XS STR PU (GLOVE) IMPLANT
GLOVE INDICATOR 7.0 STRL GRN (GLOVE) ×2 IMPLANT
GLOVE INDICATOR 7.5 STRL GRN (GLOVE) ×2 IMPLANT
GLOVE SS BIOGEL STRL SZ 6.5 (GLOVE) ×2 IMPLANT
GLOVE SUPERSENSE BIOGEL SZ 6.5 (GLOVE) ×2
GLOVE SURG SS PI 7.0 STRL IVOR (GLOVE) ×4 IMPLANT
GOWN BRE IMP SLV AUR LG STRL (GOWN DISPOSABLE) IMPLANT
GOWN BRE IMP SLV AUR XL STRL (GOWN DISPOSABLE) IMPLANT
GOWN SPEC L3 XXLG W/TWL (GOWN DISPOSABLE) ×2 IMPLANT
GOWN STRL REIN 2XL LVL4 (GOWN DISPOSABLE) IMPLANT
GOWN STRL REUS W/ TWL LRG LVL3 (GOWN DISPOSABLE) ×1 IMPLANT
GOWN STRL REUS W/TWL LRG LVL3 (GOWN DISPOSABLE) ×1
GOWN STRL REUS W/TWL MED LVL3 (GOWN DISPOSABLE) ×2 IMPLANT
HALTER HD/CHIN CERV TRACTION D (MISCELLANEOUS) ×2 IMPLANT
HEMOSTAT POWDER SURGIFOAM 1G (HEMOSTASIS) ×2 IMPLANT
KIT BASIN OR (CUSTOM PROCEDURE TRAY) ×2 IMPLANT
KIT ROOM TURNOVER OR (KITS) ×2 IMPLANT
NEEDLE HYPO 25X1 1.5 SAFETY (NEEDLE) ×2 IMPLANT
NEEDLE SPNL 22GX3.5 QUINCKE BK (NEEDLE) ×4 IMPLANT
NS IRRIG 1000ML POUR BTL (IV SOLUTION) ×2 IMPLANT
PACK LAMINECTOMY NEURO (CUSTOM PROCEDURE TRAY) ×2 IMPLANT
PAD ARMBOARD 7.5X6 YLW CONV (MISCELLANEOUS) ×6 IMPLANT
PLATE CERVICAL 32MM (Plate) ×2 IMPLANT
RUBBERBAND STERILE (MISCELLANEOUS) ×4 IMPLANT
SCREW FIX 4.0X13MM (Screw) ×2 IMPLANT
SCREW VAR 4.0X14MM (Screw) ×8 IMPLANT
SPONGE INTESTINAL PEANUT (DISPOSABLE) ×4 IMPLANT
SPONGE SURGIFOAM ABS GEL 100 (HEMOSTASIS) ×2 IMPLANT
STAPLER SKIN PROX WIDE 3.9 (STAPLE) IMPLANT
SUT VIC AB 2-0 CP2 18 (SUTURE) ×2 IMPLANT
SUT VIC AB 3-0 SH 8-18 (SUTURE) ×4 IMPLANT
SYR 20ML ECCENTRIC (SYRINGE) ×2 IMPLANT
TOWEL OR 17X24 6PK STRL BLUE (TOWEL DISPOSABLE) ×2 IMPLANT
TOWEL OR 17X26 10 PK STRL BLUE (TOWEL DISPOSABLE) ×2 IMPLANT
WATER STERILE IRR 1000ML POUR (IV SOLUTION) ×2 IMPLANT

## 2014-01-14 NOTE — Transfer of Care (Signed)
Immediate Anesthesia Transfer of Care Note  Patient: Roger Mcdaniel  Procedure(s) Performed: Procedure(s): ANTERIOR CERVICAL DECOMPRESSION/DISCECTOMY FUSION CERVICAL FOUR-FIVE,CERVICAL FIVE-SIX  (N/A)  Patient Location: PACU  Anesthesia Type:General  Level of Consciousness: awake, alert  and oriented  Airway & Oxygen Therapy: Patient Spontanous Breathing and Patient connected to nasal cannula oxygen  Post-op Assessment: Report given to PACU RN, Post -op Vital signs reviewed and stable and Patient moving all extremities X 4  Post vital signs: Reviewed and stable  Complications: No apparent anesthesia complications and Patient re-intubated

## 2014-01-14 NOTE — Anesthesia Preprocedure Evaluation (Addendum)
Anesthesia Evaluation  Patient identified by MRN, date of birth, ID band Patient awake    Reviewed: Allergy & Precautions, H&P , NPO status , Patient's Chart, lab work & pertinent test results, reviewed documented beta blocker date and time   History of Anesthesia Complications Negative for: history of anesthetic complications  Airway Mallampati: II TM Distance: >3 FB Neck ROM: Full    Dental  (+) Dental Advisory Given, Teeth Intact   Pulmonary sleep apnea ,  breath sounds clear to auscultation        Cardiovascular hypertension, Pt. on medications and Pt. on home beta blockers Rhythm:Regular Rate:Normal     Neuro/Psych    GI/Hepatic GERD-  ,  Endo/Other  Morbid obesity  Renal/GU      Musculoskeletal   Abdominal (+) + obese,   Peds  Hematology   Anesthesia Other Findings   Reproductive/Obstetrics                         Anesthesia Physical Anesthesia Plan  ASA: III  Anesthesia Plan: General   Post-op Pain Management:    Induction: Intravenous  Airway Management Planned: Oral ETT  Additional Equipment:   Intra-op Plan:   Post-operative Plan: Extubation in OR  Informed Consent: I have reviewed the patients History and Physical, chart, labs and discussed the procedure including the risks, benefits and alternatives for the proposed anesthesia with the patient or authorized representative who has indicated his/her understanding and acceptance.   Dental advisory given  Plan Discussed with: CRNA and Surgeon  Anesthesia Plan Comments:         Anesthesia Quick Evaluation

## 2014-01-14 NOTE — Anesthesia Procedure Notes (Signed)
Procedure Name: Intubation Date/Time: 01/14/2014 7:34 AM Performed by: Elon AlasLEE, Estanislao Harmon BROWN Pre-anesthesia Checklist: Patient identified, Timeout performed, Emergency Drugs available, Suction available and Patient being monitored Patient Re-evaluated:Patient Re-evaluated prior to inductionOxygen Delivery Method: Circle system utilized Preoxygenation: Pre-oxygenation with 100% oxygen Intubation Type: IV induction Ventilation: Mask ventilation without difficulty Tube type: Oral Tube size: 7.5 mm Number of attempts: 1 Airway Equipment and Method: Video-laryngoscopy and Stylet Placement Confirmation: CO2 detector,  positive ETCO2,  breath sounds checked- equal and bilateral and ETT inserted through vocal cords under direct vision Secured at: 23 cm Tube secured with: Tape Dental Injury: Teeth and Oropharynx as per pre-operative assessment  Comments: Elective glidescope intubation due to radiculopathy.

## 2014-01-14 NOTE — H&P (Signed)
Subjective: Patient is a 63 y.o. male who is admitted for treatment of left cervical radiculopathy, secondary to cervical disc herniations at the C4-5 and C5-6 levels.  Patient's been having difficulties for a couple of months, with pain in the bases neck reaming around the left scapula, down to the left shoulder, and into the left arm. He does not have any radicular weakness, numbness, or paresthesias, but does describe twitching in the left biceps muscle. Symptoms tend to be worse towards the end of the day and into the evening. The pain has been quite intense, but he was treated with a tapering course of prednisone which helped reduce the pain. He also underwent chiropractic treatment as been treated with a number of other medications including muscle relaxants, nonsteroidal anti-inflammatory drugs, and analgesics.  This was done with MRI scan that showed broad-based disc herniations at both C4-5 and C5-6, both worse than the left than to the right. The patient is admitted now for a 2 level C-5 and C5-6 anterior cervical decompression and arthrodesis with allograft and cervical plating.    Patient Active Problem List   Diagnosis Date Noted  . Neck pain on left side 12/20/2013  . Healthcare maintenance 05/23/2012  . Bunionette 12/23/2011  . Chest pain 09/07/2011  . HYPERLIPIDEMIA 07/10/2008  . GERD 07/10/2008  . Prediabetes 07/10/2008  . NEPHROLITHIASIS, HX OF 07/10/2008  . Essential hypertension, benign 02/12/2008  . SLEEP APNEA 02/12/2008   Past Medical History  Diagnosis Date  . GERD (gastroesophageal reflux disease) 09/2003  . Hyperlipidemia 09/2003  . Hypertension 06/2004  . History of ETT 05/23/2000    Nml (Dr. Elease HashimotoNahser)  . Chest pain   . Prediabetes 07/10/2008  . CMC arthritis, thumb, degenerative   . OSA (obstructive sleep apnea)     was on CPAP, currently off  . Kidney stones     takes Magnesium, Zinc and Fish oil, and B complex to prevent kidney stones  . Dry skin      Past Surgical History  Procedure Laterality Date  . Orif ankle fracture  1980    right  . Septoplasty  2005    for sleep apnea //cpap    Prescriptions prior to admission  Medication Sig Dispense Refill  . atorvastatin (LIPITOR) 80 MG tablet Take 1 tablet (80 mg total) by mouth daily.  30 tablet  0  . B Complex-C-Folic Acid (STRESS B COMPLEX PO) Take 1 tablet by mouth daily.       . carvedilol (COREG) 6.25 MG tablet Take 1 tablet (6.25 mg total) by mouth 2 (two) times daily.  60 tablet  6  . hydrochlorothiazide (HYDRODIURIL) 25 MG tablet Take 1 tablet (25 mg total) by mouth daily.  30 tablet  4  . lisinopril (PRINIVIL,ZESTRIL) 20 MG tablet Take 1 tablet (20 mg total) by mouth daily.  30 tablet  12  . Magnesium 400 MG CAPS Take 400 mg by mouth daily.      . meloxicam (MOBIC) 15 MG tablet Take 15 mg by mouth daily.       . nabumetone (RELAFEN) 500 MG tablet Take 500 mg by mouth 2 (two) times daily.      . potassium chloride (K-DUR) 10 MEQ tablet Take 1 tablet (10 mEq total) by mouth daily.  30 tablet  6  . traMADol (ULTRAM) 50 MG tablet Take 1 tablet (50 mg total) by mouth every 12 (twelve) hours as needed.  40 tablet  0  . zinc gluconate 50 MG tablet  Take 50 mg by mouth daily.      [name]. aspirin 81 MG tablet Take 81 mg by mouth daily.      . Naproxen Sod-Diphenhydramine (ALEVE PM) 220-25 MG TABS Take 1 tablet by mouth at bedtime as needed (for sleep).      . Omega-3 Fatty Acids (FISH OIL) 600 MG CAPS Take 600 mg by mouth daily.       . Red Yeast Rice 600 MG CAPS Take 600 mg by mouth 2 (two) times daily.        Allergies  Allergen Reactions  . Codeine Sulfate     REACTION: nausea  . Pravachol [Pravastatin Sodium]     Muscle aches    History  Substance Use Topics  . Smoking status: Never Smoker   . Smokeless tobacco: Never Used  . Alcohol Use: 0.5 oz/week    1 drink(s) per week     Comment: rarely    Family History  Problem Relation Age of Onset  . Heart disease Father     MI x 2   . Hypertension Brother   . Obesity Brother   . Heart disease Paternal Grandfather      MI  . Cancer Other   . CVA Mother      Review of Systems A comprehensive review of systems was negative.  Objective: Vital signs in last 24 hours: Temp:  [98.1 F (36.7 C)] 98.1 F (36.7 C) (04/20 0606) Pulse Rate:  [64] 64 (04/20 0606) Resp:  [16] 16 (04/20 0606) BP: (147)/(69) 147/69 mmHg (04/20 0606) SpO2:  [96 %] 96 % (04/20 0606) Weight:  [115.214 kg (254 lb)] 115.214 kg (254 lb) (04/20 0606)  EXAM: Patient is a well-developed well-nourished white male in discomfort, but no acute distress.  Lungs are clear to auscultation , the patient has symmetrical respiratory excursion. Heart has a regular rate and rhythm normal S1 and S2 no murmur.   Abdomen is soft nontender nondistended bowel sounds are present. Extremity examination shows no clubbing cyanosis or edema. Motor exam shows 5/5 strength in the deltoid, triceps, intrinsics, and grips bilaterally. However there is weakness in the biceps. The left is 4/5, the right is 4+ to 5/5. Sensation is intact to pinprick in the distal upper extremities. Reflexes are trace in the biceps and brachial radialis, 2 in the triceps, quadriceps, gastrocnemius. Toes are downgoing bilaterally. He has normal gait and stance.  Data Review:CBC    Component Value Date/Time   WBC 9.2 01/10/2014 1434   RBC 4.64 01/10/2014 1434   HGB 14.0 01/10/2014 1434   HCT 40.9 01/10/2014 1434   PLT 275 01/10/2014 1434   MCV 88.1 01/10/2014 1434   MCH 30.2 01/10/2014 1434   MCHC 34.2 01/10/2014 1434   RDW 12.9 01/10/2014 1434   LYMPHSABS 1.8 05/03/2011 0808   MONOABS 0.7 05/03/2011 0808   EOSABS 0.2 05/03/2011 0808   BASOSABS 0.0 05/03/2011 0808                          BMET    Component Value Date/Time   NA 137 01/10/2014 1434   NA 137 11/19/2013 0931   K 4.4 01/10/2014 1434   CL 98 01/10/2014 1434   CO2 25 01/10/2014 1434   GLUCOSE 115* 01/10/2014 1434   GLUCOSE 146* 11/19/2013 0931    BUN 15 01/10/2014 1434   BUN 17 11/19/2013 0931   CREATININE 0.81 01/10/2014 1434   CALCIUM 9.7 01/10/2014 1434  GFRNONAA >90 01/10/2014 1434   GFRAA >90 01/10/2014 1434     Assessment/Plan: Patient was left cervical radicular pain secondary to cervical disc herniations at the C4-5 and C5-6 levels. He is admitted now for a 2 level, C4-5 and C5-6 ACDF.  I've discussed with the patient the nature of his condition, the nature the surgical procedure, the typical length of surgery, hospital stay, and overall recuperation. We discussed limitations postoperatively. I discussed risks of surgery including risks of infection, bleeding, possibly need for transfusion, the risk of nerve root dysfunction with pain, weakness, numbness, or paresthesias, the risk of spinal cord dysfunction with paralysis of all 4 limbs and quadriplegia, and the risk of dural tear and CSF leakage and possible need for further surgery, the risk of esophageal dysfunction causing dysphagia and the risk of laryngeal dysfunction causing hoarseness of the voice, the risk of failure of the arthrodesis and the possible need for further surgery, and the risk of anesthetic complications including myocardial infarction, stroke, pneumonia, and death. We also discussed the need for postoperative immobilization in a cervical collar. Understanding all this the patient does wish to proceed with surgery and is admitted for such.    Hewitt Shorts, MD 01/14/2014 7:21 AM

## 2014-01-14 NOTE — Progress Notes (Signed)
Filed Vitals:   01/14/14 1143 01/14/14 1144 01/14/14 1206 01/14/14 1703  BP:   150/74 149/80  Pulse: 73 78 75 97  Temp:  96.8 F (36 C) 97.5 F (36.4 C) 97.5 F (36.4 C)  TempSrc:      Resp: 17 19 18 16   Weight:      SpO2: 96% 97% 92% 96%    Patient resting in bed, has been up and ambulating in the halls. Voiding well. Moving all 4 extremities well. Notes excellent relief of disabling left upper extremity radicular pain.  Wounds clean and dry.  Plan: Encouraged ambulation. Continued to progress through postoperative recovery.  Hewitt Shortsobert W Nudelman, MD 01/14/2014, 6:29 PM

## 2014-01-14 NOTE — Anesthesia Postprocedure Evaluation (Signed)
  Anesthesia Post-op Note  Patient: Roger Mcdaniel  Procedure(s) Performed: Procedure(s): ANTERIOR CERVICAL DECOMPRESSION/DISCECTOMY FUSION CERVICAL FOUR-FIVE,CERVICAL FIVE-SIX  (N/A)  Patient Location: PACU  Anesthesia Type:General  Level of Consciousness: awake and alert   Airway and Oxygen Therapy: Patient Spontanous Breathing  Post-op Pain: mild  Post-op Assessment: Post-op Vital signs reviewed  Post-op Vital Signs: stable  Last Vitals:  Filed Vitals:   01/14/14 1144  BP:   Pulse: 78  Temp: 36 C  Resp: 19    Complications: No apparent anesthesia complications

## 2014-01-14 NOTE — Op Note (Signed)
01/14/2014  10:38 AM  PATIENT:  Roger Mcdaniel  63 y.o. male  PRE-OPERATIVE DIAGNOSIS:  C4-5 and C5-6 Cervical herniated nucleus pulposus without myelopathy, Cervical degenerative disc disease, Cervical spondylosis, left Cervical radiculopathy  POST-OPERATIVE DIAGNOSIS:  C4-5 and C5-6 Cervical herniated nucleus pulposus without myelopathy, Cervical degenerative disc disease, Cervical spondylosis, left Cervical radiculopathy  PROCEDURE:  Procedure(s):  ANTERIOR CERVICAL DECOMPRESSION/DISCECTOMY FUSION CERVICAL FOUR-FIVE,CERVICAL FIVE-SIX:  C4-5 and C5-6 anterior cervical decompression and arthrodesis with allograft and tether cervical plating  SURGEON:  Surgeon(s): Hewitt Shortsobert W Nudelman, MD Barnett AbuHenry Elsner, MD  ASSISTANTS: Barnett AbuHenry Elsner, M.D.  ANESTHESIA:   general  EBL:  Total I/O In: 1000 [I.V.:1000] Out: 100 [Blood:100]  BLOOD ADMINISTERED:none  COUNT: Correct per nursing staff  DICTATION: Patient was brought to the operating room placed under general endotracheal anesthesia. Patient was placed in 10 pounds of halter traction. The neck was prepped with Betadine soap and solution and draped in a sterile fashion. A horizontal incision was made on the left side of the neck. The line of the incision was infiltrated with local anesthetic with epinephrine. Dissection was carried down thru the subcutaneous tissue and platysma, bipolar cautery was used to maintain hemostasis. Dissection was then carried out thru an avascular plane leaving the sternocleidomastoid carotid artery and jugular vein laterally and the trachea and esophagus medially. The ventral aspect of the vertebral column was identified and localizing x-rays were taken. The C4-5 and C5-6 levels were identified. The annulus at each level was incised and the disc space entered. Discectomy was performed with micro-curettes and pituitary rongeurs. The operating microscope was draped and brought into the field provided additional magnification  illumination and visualization. Discectomy was continued posteriorly thru the disc space and then the cartilaginous endplate was removed using micro-curettes along with the high-speed drill. Posterior osteophytic overgrowth was removed each level using the high-speed drill along with a 2 mm thin footplated Kerrison punch. Posterior longitudinal ligament along with disc herniation was carefully removed, decompressing the spinal canal and thecal sac. We then continued to remove osteophytic overgrowth and disc material decompressing the neural foramina and exiting nerve roots bilaterally. Once the decompression was completed hemostasis was established at each level with the use of Gelfoam with thrombin, Surgifoam, and bipolar cautery. The Gelfoam was removed, the wound irrigated, and hemostasis confirmed. We then measured the height of the intravertebral disc space level and selected a 7 millimeter in height structural allograft for the C4-5 level and a 7 millimeter in height structural allograft for the C5-6 level . Each was hydrated and saline solution and then gently positioned in the intravertebral disc space and countersunk. We then selected a 32 millimeter in height Tether cervical plate. It was positioned over the fusion construct and secured to the vertebra with a pair of 4 x 14 mm variable screws at the C4 level, a single 4 x 13 mm fixed screw at the C5 level, and a pair of 4 x 14 mm variable screws at the C6 level. Each screw hole was started with the high-speed drill and then the screws placed, once all the screws were placed final tightening was performed. The wound was irrigated with bacitracin solution checked for hemostasis which was established and confirmed. An x-ray was taken which showed only the superior aspect of the fusion construct, however the screws at C4 and good position. The remainder of the exposure was obscured by the patient's shoulders. We then proceeded with closure. The platysma was  closed with interrupted inverted 2-0  undyed Vicryl suture, the subcutaneous and subcuticular closed with interrupted inverted 3-0 undyed Vicryl suture. The skin edges were approximated with Dermabond. Following surgery the patient was taken out of cervical traction. To be reversed and the anesthetic and taken to the recovery room for further care.   PLAN OF CARE: Admit for overnight observation  PATIENT DISPOSITION:  PACU - hemodynamically stable.   Delay start of Pharmacological VTE agent (>24hrs) due to surgical blood loss or risk of bleeding:  yes

## 2014-01-15 ENCOUNTER — Encounter (HOSPITAL_COMMUNITY): Payer: Self-pay | Admitting: Neurosurgery

## 2014-01-15 MED ORDER — CYCLOBENZAPRINE HCL 10 MG PO TABS: 5.0000 mg | ORAL_TABLET | Freq: Three times a day (TID) | ORAL | Status: DC | PRN

## 2014-01-15 MED ORDER — HYDROCODONE-ACETAMINOPHEN 5-325 MG PO TABS: 1.0000 | ORAL_TABLET | ORAL | Status: DC | PRN

## 2014-01-15 NOTE — Progress Notes (Signed)
Pt. Alert and oriented, follows simple instructions, denies pain. Incision area without swelling, redness or S/S of infection. Voiding adequate clear yellow urine. Moving all extremities well and vitals stable and documented. Patient discharged home with spouse. Anterior Cervical Fusion surgery notes instructions given to patient and family member for home safety and precautions. Pt. and family stated understanding of instructions given.  

## 2014-01-15 NOTE — Discharge Summary (Signed)
Physician Discharge Summary  Patient ID: Roger PolingBobby J Magno MRN: [id number] DOB/AGE: 01/25/1951 63 y.o.  Admit date: 01/14/2014 Discharge date: 01/15/2014  Admission Diagnoses:  C4-5 and C5-6 Cervical herniated nucleus pulposus without myelopathy, Cervical degenerative disc disease, Cervical spondylosis, left Cervical radiculopathy  Discharge Diagnoses:   C4-5 and C5-6 Cervical herniated nucleus pulposus without myelopathy, Cervical degenerative disc disease, Cervical spondylosis, left Cervical radiculopathy  Active Problems:   HNP (herniated nucleus pulposus), cervical   Discharged Condition: good  Hospital Course: Patient was admitted, underwent a 2 level C4-5 and C5-6 ACDF. He is done well following surgery. He has had excellent relief of his left cervical radicular pain. His incision is clean and dry. He has been up and ambulating in the halls. He is asking to be discharged to home. He has been given instructions regarding wound care and activities following discharge. He is to return for followup with me in about 3 weeks.  Discharge Exam: Blood pressure 125/74, pulse 95, temperature 98.8 F (37.1 C), temperature source Oral, resp. rate 18, weight 115.214 kg (254 lb), SpO2 95.00%.  Disposition: Home   Future Appointments Provider Department Dept Phone   02/06/2014 7:35 AM [facility] [phone]   02/13/2014 7:15 AM Eustaquio BoydenJavier Gutierrez, MD [facility] [phone]       Medication List         ALEVE PM 220-25 MG Tabs  Generic drug:  Naproxen Sod-Diphenhydramine  Take 1 tablet by mouth at bedtime as needed (for sleep).     aspirin 81 MG tablet  Take 81 mg by mouth daily.     atorvastatin 80 MG tablet  Commonly known as:  LIPITOR  Take 1 tablet (80 mg total) by mouth daily.     carvedilol 6.25 MG tablet  Commonly known as:  COREG  Take 1 tablet (6.25 mg total) by mouth 2 (two) times daily.     cyclobenzaprine 10 MG  tablet  Commonly known as:  FLEXERIL  Take 0.5-1 tablets (5-10 mg total) by mouth 3 (three) times daily as needed for muscle spasms.     Fish Oil 600 MG Caps  Take 600 mg by mouth daily.     hydrochlorothiazide 25 MG tablet  Commonly known as:  HYDRODIURIL  Take 1 tablet (25 mg total) by mouth daily.     HYDROcodone-acetaminophen 5-325 MG per tablet  Commonly known as:  NORCO/VICODIN  Take 1-2 tablets by mouth every 4 (four) hours as needed for moderate pain or severe pain.     lisinopril 20 MG tablet  Commonly known as:  PRINIVIL,ZESTRIL  Take 1 tablet (20 mg total) by mouth daily.     Magnesium 400 MG Caps  Take 400 mg by mouth daily.     meloxicam 15 MG tablet  Commonly known as:  MOBIC  Take 15 mg by mouth daily.     nabumetone 500 MG tablet  Commonly known as:  RELAFEN  Take 500 mg by mouth 2 (two) times daily.     potassium chloride 10 MEQ tablet  Commonly known as:  K-DUR  Take 1 tablet (10 mEq total) by mouth daily.     Red Yeast Rice 600 MG Caps  Take 600 mg by mouth 2 (two) times daily.     STRESS B COMPLEX PO  Take 1 tablet by mouth daily.     traMADol 50 MG tablet  Commonly known as:  ULTRAM  Take 1 tablet (50 mg total)  by mouth every 12 (twelve) hours as needed.     zinc gluconate 50 MG tablet  Take 50 mg by mouth daily.         Signed: Hewitt Shortsobert W Nudelman, MD 01/15/2014, 8:20 AM

## 2014-01-15 NOTE — Discharge Instructions (Signed)

## 2014-01-16 ENCOUNTER — Other Ambulatory Visit: Payer: 59

## 2014-01-19 ENCOUNTER — Encounter: Payer: Self-pay | Admitting: Family Medicine

## 2014-01-23 ENCOUNTER — Encounter: Payer: 59 | Admitting: Family Medicine

## 2014-02-05 ENCOUNTER — Other Ambulatory Visit: Payer: Self-pay | Admitting: Family Medicine

## 2014-02-05 DIAGNOSIS — Z125 Encounter for screening for malignant neoplasm of prostate: Secondary | ICD-10-CM

## 2014-02-05 DIAGNOSIS — I10 Essential (primary) hypertension: Secondary | ICD-10-CM

## 2014-02-05 DIAGNOSIS — E785 Hyperlipidemia, unspecified: Secondary | ICD-10-CM

## 2014-02-05 DIAGNOSIS — R7303 Prediabetes: Secondary | ICD-10-CM

## 2014-02-06 ENCOUNTER — Other Ambulatory Visit: Payer: 59

## 2014-02-13 ENCOUNTER — Encounter: Payer: 59 | Admitting: Family Medicine

## 2014-03-28 ENCOUNTER — Other Ambulatory Visit: Payer: Self-pay

## 2014-03-28 MED ORDER — CARVEDILOL 6.25 MG PO TABS: 6.2500 mg | ORAL_TABLET | Freq: Two times a day (BID) | ORAL | Status: DC

## 2014-04-25 ENCOUNTER — Other Ambulatory Visit: Payer: Self-pay

## 2014-04-25 MED ORDER — POTASSIUM CHLORIDE ER 10 MEQ PO TBCR: 10.0000 meq | EXTENDED_RELEASE_TABLET | Freq: Every day | ORAL | Status: DC

## 2014-05-28 ENCOUNTER — Other Ambulatory Visit: Payer: Self-pay | Admitting: *Deleted

## 2014-05-28 MED ORDER — HYDROCHLOROTHIAZIDE 25 MG PO TABS: 25.0000 mg | ORAL_TABLET | Freq: Every day | ORAL | Status: DC

## 2014-05-28 NOTE — Telephone Encounter (Signed)
Requested Prescriptions   Signed Prescriptions Disp Refills  . hydrochlorothiazide (HYDRODIURIL) 25 MG tablet 30 tablet 1    Sig: Take 1 tablet (25 mg total) by mouth daily.    Authorizing Provider: Vesta Mixer    Ordering User: Kendrick Fries

## 2014-06-28 ENCOUNTER — Encounter: Payer: Self-pay | Admitting: Cardiovascular Disease

## 2014-06-28 ENCOUNTER — Ambulatory Visit (INDEPENDENT_AMBULATORY_CARE_PROVIDER_SITE_OTHER): Payer: BC Managed Care – PPO | Admitting: Cardiovascular Disease

## 2014-06-28 VITALS — BP 134/80 | HR 51 | Ht 64.0 in | Wt 245.5 lb

## 2014-06-28 DIAGNOSIS — I10 Essential (primary) hypertension: Secondary | ICD-10-CM

## 2014-06-28 DIAGNOSIS — E7800 Pure hypercholesterolemia, unspecified: Secondary | ICD-10-CM

## 2014-06-28 DIAGNOSIS — E785 Hyperlipidemia, unspecified: Secondary | ICD-10-CM

## 2014-06-28 NOTE — Assessment & Plan Note (Signed)
His BP is much better.  He has lost 10 lbs. Continue with weight loss and diet  Continue current meds.

## 2014-06-28 NOTE — Assessment & Plan Note (Signed)
Will check fasting labs today. His numbers looked great back inFeb.

## 2014-06-28 NOTE — Progress Notes (Signed)
Roger PolingBobby J Mcdaniel Date of Birth  [birth year] [facility]     [place] [facility]  [address]    [address]   [address] [place], [place]  [id number]    [place], [place]  [id number] [phone]  Fax  [phone]  [phone]  Fax [phone]  Problem list: 1. Hypertension 2. Obesity 3. Obstructive sleep apnea  History of Present Illness:  Roger Mcdaniel is a 63 year old gentleman with a history of hypertension and sleep apnea. I saw him approximately one month ago for hypertension. He has been gradually titrating his medications up and he still had episodes of hypertension. He's been limiting his salt intake. He denies any chest pain or shortness of breath.  He's been taking care of his granddaughter for the past 6 months. As result he's gained a lot of weight. He's not been able to exercise.  He was tried on Pravachol. He had lots of muscle aches and pains and had to stop the Pravachol. His muscle aches and pains have improved.  Feb 09, 2013:  Doing well from a cardiac standpoint.  He had some generalized aches that lasted 3 days.  Associated with some stomach issues.  Feb. 23, 2015:  Roger Mcdaniel is feeling well. He has been having some episodes of lightheadedness and has been found to have orthostatic hypotension. One is his 62 yo cousins diet this past week of a sudden heart attack.     He has had some left shoulder / rotator cuff difficulities.   He has had some leg swelling.  He has not been exercising much.    06/28/2014:  Roger Mcdaniel is doing ok.  Had neck surgery since I have seen him.  He has taken a new job - much less stress. He has lost > 10 lbs.     Current Outpatient Prescriptions on File Prior to Visit  Medication Sig Dispense Refill  . aspirin 81 MG tablet Take 81 mg by mouth daily.      [name]. atorvastatin (LIPITOR) 80 MG tablet Take 1 tablet (80 mg total) by mouth daily.  30 tablet  0  . B Complex-C-Folic Acid (STRESS B COMPLEX PO) Take 1 tablet by mouth daily.        . carvedilol (COREG) 6.25 MG tablet Take 1 tablet (6.25 mg total) by mouth 2 (two) times daily.  60 tablet  6  . hydrochlorothiazide (HYDRODIURIL) 25 MG tablet Take 1 tablet (25 mg total) by mouth daily.  30 tablet  1  . lisinopril (PRINIVIL,ZESTRIL) 20 MG tablet Take 1 tablet (20 mg total) by mouth daily.  30 tablet  12  . Magnesium 400 MG CAPS Take 400 mg by mouth daily.      . meloxicam (MOBIC) 15 MG tablet Take 15 mg by mouth daily.       . Omega-3 Fatty Acids (FISH OIL) 600 MG CAPS Take 600 mg by mouth daily.       . potassium chloride (K-DUR) 10 MEQ tablet Take 1 tablet (10 mEq total) by mouth daily.  30 tablet  6  . Red Yeast Rice 600 MG CAPS Take 600 mg by mouth 2 (two) times daily.       [name]. zinc gluconate 50 MG tablet Take 50 mg by mouth daily.       No current facility-administered medications on file prior to visit.    Allergies  Allergen Reactions  . Codeine Sulfate     REACTION: nausea  . Pravachol [Pravastatin Sodium]  Muscle aches    Past Medical History  Diagnosis Date  . GERD (gastroesophageal reflux disease) 09/2003  . Hyperlipidemia 09/2003  . Hypertension 06/2004  . History of ETT 05/23/2000    Nml (Dr. Elease Hashimoto)  . Chest pain   . Prediabetes 07/10/2008  . CMC arthritis, thumb, degenerative   . OSA (obstructive sleep apnea)     was on CPAP, currently off  . Kidney stones     takes Magnesium, Zinc and Fish oil, and B complex to prevent kidney stones  . Dry skin     Past Surgical History  Procedure Laterality Date  . Orif ankle fracture  1980    right  . Septoplasty  2005    for sleep apnea //cpap  . Anterior cervical decomp/discectomy fusion N/A 01/14/2014    Procedure: ANTERIOR CERVICAL DECOMPRESSION/DISCECTOMY FUSION CERVICAL FOUR-FIVE,CERVICAL FIVE-SIX ;  Surgeon: Hewitt Shorts, MD    History  Smoking status  . Never Smoker   Smokeless tobacco  . Never Used    History  Alcohol Use No    Comment: rarely    Family History  Problem  Relation Age of Onset  . Heart disease Father     MI x 2  . Hypertension Brother   . Obesity Brother   . Heart disease Paternal Grandfather      MI  . Cancer Other   . CVA Mother     Reviw of Systems:  Reviewed in the HPI.  All other systems are negative.  Physical Exam: Blood pressure 134/80, pulse 51, height 5\' 4"  (1.626 m), weight 245 lb 8 oz (111.358 kg). General: Well developed, well nourished, in no acute distress.  Head: Normocephalic, atraumatic, sclera non-icteric, mucus membranes are moist,   Neck: Supple. Negative for carotid bruits. JVD not elevated.  Lungs: Clear bilaterally to auscultation without wheezes, rales, or rhonchi. Breathing is unlabored.  Heart: RRR with S1 S2. No murmurs, rubs, or gallops appreciated.  Abdomen: Soft, non-tender, non-distended with normoactive bowel sounds. He is moderately obese.  Msk:  Strength and tone appear normal for age.  Extremities: No clubbing or cyanosis. No edema.  Distal pedal pulses are 2+ and equal bilaterally.  Neuro: Alert and oriented X 3. Moves all extremities spontaneously.  Psych:  Responds to questions appropriately with a normal affect.  ECG: 06/28/2014: Sinus bradycardia 51. He has no ST or T wave changes.  Assessment / Plan:

## 2014-06-28 NOTE — Patient Instructions (Signed)
Your physician recommends that you have labs today: Lipid and Liver Panel  BMP   Your physician wants you to follow-up in: 6 months. You will receive a reminder letter in the mail two months in advance. If you don't receive a letter, please call our office to schedule the follow-up appointment.  Your physician recommends that you continue on your current medications as directed. Please refer to the Current Medication list given to you today.

## 2014-06-29 LAB — BASIC METABOLIC PANEL
BUN/Creatinine Ratio: 21 (ref 10–22)
BUN: 18 mg/dL (ref 8–27)
CO2: 26 mmol/L (ref 18–29)
CREATININE: 0.86 mg/dL (ref 0.76–1.27)
Calcium: 9.2 mg/dL (ref 8.6–10.2)
Chloride: 97 mmol/L (ref 97–108)
GFR, EST AFRICAN AMERICAN: 107 mL/min/{1.73_m2} (ref >59)
GFR, EST NON AFRICAN AMERICAN: 93 mL/min/{1.73_m2} (ref >59)
GLUCOSE: 97 mg/dL (ref 65–99)
Potassium: 4.5 mmol/L (ref 3.5–5.2)
Sodium: 137 mmol/L (ref 134–144)

## 2014-06-29 LAB — HEPATIC FUNCTION PANEL
ALT: 26 IU/L (ref 0–44)
AST: 24 IU/L (ref 0–40)
Albumin: 4.4 g/dL (ref 3.6–4.8)
Alkaline Phosphatase: 77 IU/L (ref 39–117)
BILIRUBIN TOTAL: 0.9 mg/dL (ref 0.0–1.2)
Bilirubin, Direct: 0.22 mg/dL (ref 0.00–0.40)
Total Protein: 6.6 g/dL (ref 6.0–8.5)

## 2014-06-29 LAB — LIPID PANEL
CHOLESTEROL TOTAL: 124 mg/dL (ref 100–199)
Chol/HDL Ratio: 2.8 ratio units (ref 0.0–5.0)
HDL: 44 mg/dL (ref >39)
LDL Calculated: 60 mg/dL (ref 0–99)
Triglycerides: 98 mg/dL (ref 0–149)
VLDL CHOLESTEROL CAL: 20 mg/dL (ref 5–40)

## 2014-07-01 NOTE — Progress Notes (Signed)
LVM 07/01/14

## 2014-07-16 ENCOUNTER — Other Ambulatory Visit: Payer: Self-pay | Admitting: *Deleted

## 2014-07-16 MED ORDER — ATORVASTATIN CALCIUM 80 MG PO TABS: 80.0000 mg | ORAL_TABLET | Freq: Every day | ORAL | Status: DC

## 2014-07-23 ENCOUNTER — Other Ambulatory Visit: Payer: Self-pay

## 2014-07-23 MED ORDER — HYDROCHLOROTHIAZIDE 25 MG PO TABS: 25.0000 mg | ORAL_TABLET | Freq: Every day | ORAL | Status: DC

## 2014-07-23 NOTE — Telephone Encounter (Signed)
Refill sent for HCTZ 

## 2014-09-10 ENCOUNTER — Other Ambulatory Visit: Payer: Self-pay

## 2014-09-10 MED ORDER — ATORVASTATIN CALCIUM 80 MG PO TABS: 80.0000 mg | ORAL_TABLET | Freq: Every day | ORAL | Status: DC

## 2014-09-10 NOTE — Telephone Encounter (Signed)
Refill sent for atorvastatin. 

## 2014-10-25 ENCOUNTER — Other Ambulatory Visit: Payer: Self-pay

## 2014-10-25 MED ORDER — CARVEDILOL 6.25 MG PO TABS: 6.2500 mg | ORAL_TABLET | Freq: Two times a day (BID) | ORAL | Status: DC

## 2014-10-25 NOTE — Telephone Encounter (Signed)
90 DAY REQUEST

## 2014-10-25 NOTE — Telephone Encounter (Signed)
Refill sent for carvedilol.  

## 2014-11-18 ENCOUNTER — Other Ambulatory Visit: Payer: Self-pay | Admitting: *Deleted

## 2014-11-18 MED ORDER — POTASSIUM CHLORIDE ER 10 MEQ PO TBCR: 10.0000 meq | EXTENDED_RELEASE_TABLET | Freq: Every day | ORAL | Status: DC

## 2014-12-06 ENCOUNTER — Encounter: Payer: Self-pay | Admitting: Cardiovascular Disease

## 2014-12-10 ENCOUNTER — Other Ambulatory Visit: Payer: Self-pay | Admitting: Nurse Practitioner

## 2014-12-11 ENCOUNTER — Telehealth: Payer: Self-pay | Admitting: *Deleted

## 2014-12-11 DIAGNOSIS — E785 Hyperlipidemia, unspecified: Secondary | ICD-10-CM

## 2014-12-11 DIAGNOSIS — I10 Essential (primary) hypertension: Secondary | ICD-10-CM

## 2014-12-11 NOTE — Telephone Encounter (Signed)
Per email from Dr. Elease HashimotoNahser schedule patient for lipid, liver, bmp before his follow up appt  Patient scheduled patient verbalized understanding

## 2014-12-16 ENCOUNTER — Other Ambulatory Visit (INDEPENDENT_AMBULATORY_CARE_PROVIDER_SITE_OTHER): Payer: BLUE CROSS/BLUE SHIELD

## 2014-12-16 DIAGNOSIS — I10 Essential (primary) hypertension: Secondary | ICD-10-CM

## 2014-12-16 DIAGNOSIS — E785 Hyperlipidemia, unspecified: Secondary | ICD-10-CM

## 2014-12-17 LAB — BASIC METABOLIC PANEL
BUN / CREAT RATIO: 21 (ref 10–22)
BUN: 20 mg/dL (ref 8–27)
CO2: 23 mmol/L (ref 18–29)
Calcium: 9.2 mg/dL (ref 8.6–10.2)
Chloride: 101 mmol/L (ref 97–108)
Creatinine, Ser: 0.96 mg/dL (ref 0.76–1.27)
GFR, EST AFRICAN AMERICAN: 98 mL/min/{1.73_m2} (ref >59)
GFR, EST NON AFRICAN AMERICAN: 84 mL/min/{1.73_m2} (ref >59)
Glucose: 141 mg/dL — ABNORMAL HIGH (ref 65–99)
Potassium: 4.6 mmol/L (ref 3.5–5.2)
Sodium: 138 mmol/L (ref 134–144)

## 2014-12-17 LAB — HEPATIC FUNCTION PANEL
ALT: 23 IU/L (ref 0–44)
AST: 23 IU/L (ref 0–40)
Albumin: 4.2 g/dL (ref 3.6–4.8)
Alkaline Phosphatase: 71 IU/L (ref 39–117)
BILIRUBIN, DIRECT: 0.22 mg/dL (ref 0.00–0.40)
Bilirubin Total: 0.7 mg/dL (ref 0.0–1.2)
Total Protein: 6.3 g/dL (ref 6.0–8.5)

## 2014-12-17 LAB — LIPID PANEL
CHOLESTEROL TOTAL: 106 mg/dL (ref 100–199)
Chol/HDL Ratio: 2.9 ratio units (ref 0.0–5.0)
HDL: 37 mg/dL — AB (ref >39)
LDL CALC: 54 mg/dL (ref 0–99)
TRIGLYCERIDES: 74 mg/dL (ref 0–149)
VLDL Cholesterol Cal: 15 mg/dL (ref 5–40)

## 2014-12-19 ENCOUNTER — Encounter: Payer: Self-pay | Admitting: Cardiovascular Disease

## 2014-12-19 ENCOUNTER — Ambulatory Visit (INDEPENDENT_AMBULATORY_CARE_PROVIDER_SITE_OTHER): Payer: BLUE CROSS/BLUE SHIELD | Admitting: Cardiovascular Disease

## 2014-12-19 VITALS — BP 128/81 | HR 70 | Ht 64.0 in | Wt 248.8 lb

## 2014-12-19 DIAGNOSIS — I10 Essential (primary) hypertension: Secondary | ICD-10-CM

## 2014-12-19 MED ORDER — CARVEDILOL 6.25 MG PO TABS
6.2500 mg | ORAL_TABLET | Freq: Two times a day (BID) | ORAL | Status: DC
Start: 2014-12-19 — End: 2015-12-23

## 2014-12-19 MED ORDER — HYDROCHLOROTHIAZIDE 25 MG PO TABS: 25.0000 mg | ORAL_TABLET | Freq: Every day | ORAL | Status: DC

## 2014-12-19 MED ORDER — ATORVASTATIN CALCIUM 80 MG PO TABS: 80.0000 mg | ORAL_TABLET | Freq: Every day | ORAL | Status: DC

## 2014-12-19 MED ORDER — POTASSIUM CHLORIDE ER 10 MEQ PO TBCR: 10.0000 meq | EXTENDED_RELEASE_TABLET | Freq: Every day | ORAL | Status: DC

## 2014-12-19 MED ORDER — LISINOPRIL 20 MG PO TABS: 20.0000 mg | ORAL_TABLET | Freq: Every day | ORAL | Status: DC

## 2014-12-19 NOTE — Patient Instructions (Signed)
Your physician wants you to follow-up in: 1 year. You will receive a reminder letter in the mail two months in advance. If you don't receive a letter, please call our office to schedule the follow-up appointment.   Your cardiac medication has been refilled as requested

## 2014-12-19 NOTE — Progress Notes (Signed)
Cardiology Office Note   Date:  12/19/2014   ID:  Roger Mcdaniel, DOB Apr 09, 1951, MRN [id number]  PCP:  Eustaquio BoydenJavier Gutierrez, MD  Cardiologist:   Elease HashimotoNahser, Deloris PingPhilip J, MD   Chief Complaint  Patient presents with  . other    6 month f/u no complaints. No EKG unless necessary. Meds reviewed verbally with pt.   1. Hypertension 2. Obesity 3. Obstructive sleep apnea  History of Present Illness:  Roger Mcdaniel is a 64 year old gentleman with a history of hypertension and sleep apnea. I saw him approximately one month ago for hypertension. He has been gradually titrating his medications up and he still had episodes of hypertension. He's been limiting his salt intake. He denies any chest pain or shortness of breath.  He's been taking care of his granddaughter for the past 6 months. As result he's gained a lot of weight. He's not been able to exercise.  He was tried on Pravachol. He had lots of muscle aches and pains and had to stop the Pravachol. His muscle aches and pains have improved.  Feb 09, 2013:  Doing well from a cardiac standpoint. He had some generalized aches that lasted 3 days. Associated with some stomach issues.  Feb. 23, 2015:  Roger Mcdaniel is feeling well. He has been having some episodes of lightheadedness and has been found to have orthostatic hypotension. One is his 63 yo cousins diet this past week of a sudden heart attack. He has had some left shoulder / rotator cuff difficulities. He has had some leg swelling. He has not been exercising much.   06/28/2014:  Roger Mcdaniel is doing ok. Had neck surgery since I have seen him.  He has taken a new job - much less stress. He has lost > 10 lbs.    December 19, 2014:  Roger Mcdaniel is a 64 y.o. male who presents for follow up of his HTN Doing much better with his new job.  Exercising , walking regulary.      Past Medical History  Diagnosis Date  . GERD (gastroesophageal reflux disease) 09/2003  . Hyperlipidemia 09/2003  .  Hypertension 06/2004  . History of ETT 05/23/2000    Nml (Dr. Elease HashimotoNahser)  . Chest pain   . Prediabetes 07/10/2008  . CMC arthritis, thumb, degenerative   . OSA (obstructive sleep apnea)     was on CPAP, currently off  . Kidney stones     takes Magnesium, Zinc and Fish oil, and B complex to prevent kidney stones  . Dry skin     Past Surgical History  Procedure Laterality Date  . Orif ankle fracture  1980    right  . Septoplasty  2005    for sleep apnea //cpap  . Anterior cervical decomp/discectomy fusion N/A 01/14/2014    Procedure: ANTERIOR CERVICAL DECOMPRESSION/DISCECTOMY FUSION CERVICAL FOUR-FIVE,CERVICAL FIVE-SIX ;  Surgeon: Hewitt Shortsobert W Nudelman, MD     Current Outpatient Prescriptions  Medication Sig Dispense Refill  . aspirin 81 MG tablet Take 81 mg by mouth daily.    [name]. atorvastatin (LIPITOR) 80 MG tablet Take 1 tablet (80 mg total) by mouth daily. 30 tablet 3  . B Complex-C-Folic Acid (STRESS B COMPLEX PO) Take 1 tablet by mouth daily.     . carvedilol (COREG) 6.25 MG tablet Take 1 tablet (6.25 mg total) by mouth 2 (two) times daily. 60 tablet 3  . hydrochlorothiazide (HYDRODIURIL) 25 MG tablet Take 1 tablet (25 mg total) by mouth daily. 30 tablet 6  .  lisinopril (PRINIVIL,ZESTRIL) 20 MG tablet Take 1 tablet (20 mg total) by mouth daily. 30 tablet 12  . Magnesium 400 MG CAPS Take 400 mg by mouth daily.    . meloxicam (MOBIC) 15 MG tablet Take 15 mg by mouth daily.     . naproxen sodium (ANAPROX) 220 MG tablet Take 220 mg by mouth 2 (two) times daily with a meal.    . Omega-3 Fatty Acids (FISH OIL) 1000 MG CAPS Take by mouth 2 (two) times daily.    . potassium chloride (K-DUR) 10 MEQ tablet Take 1 tablet (10 mEq total) by mouth daily. 30 tablet 0  . Red Yeast Rice 600 MG CAPS Take 600 mg by mouth 2 (two) times daily.     [name] zinc gluconate 50 MG tablet Take 50 mg by mouth daily.     No current facility-administered medications for this visit.    Allergies:   Codeine sulfate  and Pravachol    Social History:  The patient  reports that he has never smoked. He has never used smokeless tobacco. He reports that he does not drink alcohol or use illicit drugs.   Family History:  The patient's family history includes CVA in his mother; Cancer in his other; Heart disease in his father and paternal grandfather; Hypertension in his brother; Obesity in his brother.    ROS:  Please see the history of present illness.    Review of Systems: Constitutional:  denies fever, chills, diaphoresis, appetite change and fatigue.  HEENT: denies photophobia, eye pain, redness, hearing loss, ear pain, congestion, sore throat, rhinorrhea, sneezing, neck pain, neck stiffness and tinnitus.  Respiratory: denies SOB, DOE, cough, chest tightness, and wheezing.  Cardiovascular: denies chest pain, palpitations and leg swelling.  Gastrointestinal: denies nausea, vomiting, abdominal pain, diarrhea, constipation, blood in stool.  Genitourinary: denies dysuria, urgency, frequency, hematuria, flank pain and difficulty urinating.  Musculoskeletal: denies  myalgias, back pain, joint swelling, arthralgias and gait problem.   Skin: denies pallor, rash and wound.  Neurological: denies dizziness, seizures, syncope, weakness, light-headedness, numbness and headaches.   Hematological: denies adenopathy, easy bruising, personal or family bleeding history.  Psychiatric/ Behavioral: denies suicidal ideation, mood changes, confusion, nervousness, sleep disturbance and agitation.       All other systems are reviewed and negative.    PHYSICAL EXAM: VS:  BP 128/81 mmHg  Pulse 70  Ht  (1.626 m)  Wt 248 lb 12 oz (112.832 kg)  BMI 42.68 kg/m2 , BMI Body mass index is 42.68 kg/(m^2). GEN: Well nourished, well developed, in no acute distress HEENT: normal Neck: no JVD, carotid bruits, or masses Cardiac: RRR; no murmurs, rubs, or gallops,no edema  Respiratory:  clear to auscultation bilaterally, normal  work of breathing GI: soft, nontender, nondistended, + BS MS: no deformity or atrophy Skin: warm and dry, no rash Neuro:  Strength and sensation are intact Psych: normal   EKG:  EKG is not ordered today.    Recent Labs: 01/10/2014: Hemoglobin 14.0; Platelets 275 12/16/2014: ALT 23; BUN 20; Creatinine 0.96; Potassium 4.6; Sodium 138    Lipid Panel    Component Value Date/Time   CHOL 106 12/16/2014 0832   CHOL 114 04/02/2013 0739   TRIG 74 12/16/2014 0832   TRIG 107 04/02/2013 0739   HDL 37* 12/16/2014 0832   HDL 38* 04/02/2013 0739   HDL 41.10 05/15/2012 0757   CHOLHDL 2.9 12/16/2014 0832   CHOLHDL 3 05/15/2012 0757   VLDL 19.0 05/15/2012 0757  LDLCALC 54 12/16/2014 0832   LDLCALC 55 04/02/2013 0739   LDLCALC 75 05/15/2012 0757   LDLDIRECT 143.5 04/27/2011 0816      Wt Readings from Last 3 Encounters:  12/19/14 248 lb 12 oz (112.832 kg)  06/28/14 245 lb 8 oz (111.358 kg)  01/10/14 254 lb 6.6 oz (115.4 kg)      Other studies Reviewed: Additional studies/ records that were reviewed today include: . Review of the above records demonstrates:    ASSESSMENT AND PLAN:  1. Hypertension - BP is ok Continue current meds  2. Obesity - encouraged diet and exercise  3. Obstructive sleep apnea  4. Hyperglycemia:  Glucose was 141 at his more recent BMP.  Will have him ask his primary medical doctor to address this .    Current medicines are reviewed at length with the patient today.  The patient does not have concerns regarding medicines.  The following changes have been made:  no change  Labs/ tests ordered today include:  No orders of the defined types were placed in this encounter.     Disposition:   FU with me in 1 year.     Signed, Russell Engelstad, Deloris Ping, MD  12/19/2014 2:06 PM    [facility] [address] [place], [place], [place]  [address] Phone: [phone]; Fax: [phone]

## 2014-12-30 ENCOUNTER — Encounter: Payer: Self-pay | Admitting: Cardiovascular Disease

## 2015-01-24 ENCOUNTER — Other Ambulatory Visit: Payer: Self-pay

## 2015-01-24 MED ORDER — HYDROCHLOROTHIAZIDE 25 MG PO TABS: 25.0000 mg | ORAL_TABLET | Freq: Every day | ORAL | Status: DC

## 2015-07-14 ENCOUNTER — Encounter: Payer: Self-pay | Admitting: Cardiovascular Disease

## 2015-07-14 ENCOUNTER — Encounter: Payer: Self-pay | Admitting: Family Medicine

## 2015-10-24 ENCOUNTER — Encounter: Payer: Self-pay | Admitting: Cardiovascular Disease

## 2015-12-03 ENCOUNTER — Other Ambulatory Visit: Payer: Self-pay | Admitting: Family Medicine

## 2015-12-03 DIAGNOSIS — E785 Hyperlipidemia, unspecified: Secondary | ICD-10-CM

## 2015-12-03 DIAGNOSIS — I10 Essential (primary) hypertension: Secondary | ICD-10-CM

## 2015-12-03 DIAGNOSIS — R7303 Prediabetes: Secondary | ICD-10-CM

## 2015-12-03 DIAGNOSIS — Z125 Encounter for screening for malignant neoplasm of prostate: Secondary | ICD-10-CM

## 2015-12-05 ENCOUNTER — Other Ambulatory Visit: Payer: Self-pay | Admitting: Family Medicine

## 2015-12-05 ENCOUNTER — Other Ambulatory Visit (INDEPENDENT_AMBULATORY_CARE_PROVIDER_SITE_OTHER): Payer: BLUE CROSS/BLUE SHIELD

## 2015-12-05 ENCOUNTER — Telehealth: Payer: Self-pay | Admitting: Family Medicine

## 2015-12-05 DIAGNOSIS — I10 Essential (primary) hypertension: Secondary | ICD-10-CM

## 2015-12-05 DIAGNOSIS — E785 Hyperlipidemia, unspecified: Secondary | ICD-10-CM | POA: Diagnosis not present

## 2015-12-05 DIAGNOSIS — R7303 Prediabetes: Secondary | ICD-10-CM | POA: Diagnosis not present

## 2015-12-05 DIAGNOSIS — Z125 Encounter for screening for malignant neoplasm of prostate: Secondary | ICD-10-CM

## 2015-12-05 LAB — COMPREHENSIVE METABOLIC PANEL
ALT: 19 U/L (ref 0–53)
AST: 15 U/L (ref 0–37)
Albumin: 4 g/dL (ref 3.5–5.2)
Alkaline Phosphatase: 70 U/L (ref 39–117)
BILIRUBIN TOTAL: 1.1 mg/dL (ref 0.2–1.2)
BUN: 15 mg/dL (ref 6–23)
CO2: 32 meq/L (ref 19–32)
CREATININE: 0.93 mg/dL (ref 0.40–1.50)
Calcium: 9.2 mg/dL (ref 8.4–10.5)
Chloride: 99 mEq/L (ref 96–112)
GFR: 87.09 mL/min (ref >60.00)
GLUCOSE: 170 mg/dL — AB (ref 70–99)
Potassium: 4.3 mEq/L (ref 3.5–5.1)
Sodium: 138 mEq/L (ref 135–145)
Total Protein: 6.6 g/dL (ref 6.0–8.3)

## 2015-12-05 LAB — LIPID PANEL
Cholesterol: 111 mg/dL (ref 0–200)
HDL: 31.7 mg/dL — ABNORMAL LOW (ref >39.00)
LDL Cholesterol: 55 mg/dL (ref 0–99)
NONHDL: 78.85
Total CHOL/HDL Ratio: 3
Triglycerides: 121 mg/dL (ref 0.0–149.0)
VLDL: 24.2 mg/dL (ref 0.0–40.0)

## 2015-12-05 LAB — HEMOGLOBIN A1C: Hgb A1c MFr Bld: 8.4 % — ABNORMAL HIGH (ref 4.6–6.5)

## 2015-12-05 LAB — PSA: PSA: 0.99 ng/mL (ref 0.10–4.00)

## 2015-12-05 MED ORDER — POLYMYXIN B-TRIMETHOPRIM 10000-0.1 UNIT/ML-% OP SOLN: 1.0000 [drp] | OPHTHALMIC | Status: DC

## 2015-12-05 NOTE — Telephone Encounter (Signed)
[facility] - Client TELEPHONE ADVICE RECORD [facility]  Patient Name: Roger Mcdaniel  DOB: 09/13/1951    Initial Comment Caller states he may have contracted pink eye from grandson. Experiencing eye redness, watering, and itching   Nurse Assessment  Nurse: Phylliss Bobowe, RN, Synetta FailAnita Date/Time (Eastern Time): 12/05/2015 5:27:26 PM  Confirm and document reason for call. If symptomatic, describe symptoms. You must click the next button to save text entered. ---Caller states he may have contracted pink eye from grandson. Experiencing eye redness, watering, and itching caller stated that he has both eyes and ahs been having slight puffiness and no fevers and has slight discharge unsure of the color of the discharge  Has the patient traveled out of the country within the last 30 days? ---No  Does the patient have any new or worsening symptoms? ---Yes  Will a triage be completed? ---Yes  Related visit to physician within the last 2 weeks? ---No  Does the PT have any chronic conditions? (i.e. diabetes, asthma, etc.) ---No  Is this a behavioral health or substance abuse call? ---No     Guidelines    Guideline Title Affirmed Question Affirmed Notes  Eye - Pus or Discharge [1] Eye with yellow/green discharge or eyelashes stick together AND [2] NO PCP standing order to call in antibiotic eye drops    Final Disposition User   Call PCP within 24 Hours Rowe, RN, Synetta Failnita    Comments  call placed to the caller to notify him of the status fo the RX Verbalized understanding   Referrals  REFERRED TO PCP OFFICE   Disagree/Comply: Comply

## 2015-12-05 NOTE — Progress Notes (Signed)
Grandson with pink eye. Sent in polytrim for the patient.

## 2015-12-08 NOTE — Telephone Encounter (Signed)
PLEASE NOTE: All timestamps contained within this report are represented as [place] Standard Time. CONFIDENTIALTY NOTICE: This fax transmission is intended only for the addressee. It contains information that is legally privileged, confidential or otherwise protected from use or disclosure. If you are not the intended recipient, you are strictly prohibited from reviewing, disclosing, copying using or disseminating any of this information or taking any action in reliance on or regarding this information. If you have received this fax in error, please notify us immediately by telephone so that we can arrange for its return to [place]. Phone: [phone], Toll-Free: [phone], Fax: [phone] Page: 1 of 2 Call Id: [id number] [facility] Day - Client TELEPHONE ADVICE RECORD [facility] Patient Name: Roger Mcdaniel Gender: Male DOB: [birth year] Age: 65 Y 5 M 20 D Return Phone Number: [phone] (Primary) Address: City/State/Zip: [place] Client [facility] - Client Client Site [facility] [place] - Day Physician Eustaquio Boyden Contact Type Call Who Is Calling Patient / Member / Family / Caregiver Call Type Triage / Clinical Relationship To Patient Self Return Phone Number [phone] (Primary) Chief Complaint Eye Redness Reason for Call Symptomatic / Request for Health Information Initial Comment Caller states he may have contracted pink eye from grandson. Experiencing eye redness, watering, and itching Appointment Disposition EMR Appointment Not Necessary Info pasted into Epic Yes PreDisposition Call Doctor Translation No Nurse Assessment Nurse: Phylliss Bob, RN, Synetta Fail Date/Time (Eastern Time): 12/05/2015 5:27:26 PM Confirm and document reason for call. If symptomatic, describe symptoms. You must click the next button to save text entered. ---Caller states he may have contracted pink eye from grandson. Experiencing  eye redness, watering, and itching caller stated that he has both eyes and ahs been having slight puffiness and no fevers and has slight discharge unsure of the color of the discharge Has the patient traveled out of the country within the last 30 days? ---No Does the patient have any new or worsening symptoms? ---Yes Will a triage be completed? ---Yes Related visit to physician within the last 2 weeks? ---No Does the PT have any chronic conditions? (i.e. diabetes, asthma, etc.) ---No Is this a behavioral health or substance abuse call? ---No Guidelines Guideline Title Affirmed Question Affirmed Notes Nurse Date/Time (Eastern Time) Eye - Pus or Discharge [1] Eye with yellow/green discharge or eyelashes stick together AND [2] NO PCP standing order to call in antibiotic eye drops Rowe, RN, Synetta Fail 12/05/2015 5:30:41 PM PLEASE NOTE: All timestamps contained within this report are represented as [place] Standard Time. CONFIDENTIALTY NOTICE: This fax transmission is intended only for the addressee. It contains information that is legally privileged, confidential or otherwise protected from use or disclosure. If you are not the intended recipient, you are strictly prohibited from reviewing, disclosing, copying using or disseminating any of this information or taking any action in reliance on or regarding this information. If you have received this fax in error, please notify us immediately by telephone so that we can arrange for its return to [place]. Phone: [phone], Toll-Free: [phone], Fax: [phone] Page: 2 of 2 Call Id: [id number] Disp. Time Lamount Cohen Time) Disposition Final User 12/05/2015 5:36:40 PM Called On-Call Provider Phylliss Bob, RN, Synetta Fail 12/05/2015 5:39:11 PM Call Completed Phylliss Bob, RN, Synetta Fail 12/05/2015 5:35:39 PM Call PCP within 24 Hours Yes Phylliss Bob, RN, Rudell Cobb Understands: Yes Disagree/Comply: Comply Care Advice Given Per Guideline CALL PCP WITHIN 24 HOURS: You need to discuss  this with your doctor within the next 24 hours. *  IF OFFICE WILL BE CLOSED: I'll page him now.( PRESCRIPTION ANTIBIOTIC EYEDROPS: * Yellow or green discharge from the eyes is usually treated with a prescription eyedrop. EYELID CLEANSING: * Gently wash eyelids and lashes with warm water and wet cotton balls (or cotton gauze). Remove all the dried and liquid pus. * Do this as often as needed. CALL BACK IF: * Pus lasts over 3 days (72 hours) on treatment * Blurred vision develops * More than just mild discomfort * You become worse CARE ADVICE given per Eye - Pus or Discharge (Adult) guideline. Comments User: Dawayne PatriciaAnita, Rowe, RN Date/Time Lamount Cohen(Eastern Time): 12/05/2015 5:39:53 PM call placed to the caller to notify him of the status fo the RX Verbalized understanding Referrals REFERRED TO PCP OFFICE [facility] DateTime Result/Outcome Message Type Notes Hannah BeatCopland, Spencer [phone] 12/05/2015 5:36:40 PM Called On Call Provider - Reached Doctor Paged Hannah BeatCopland, Spencer 12/05/2015 5:38:59 PM Spoke with On Call - General Message Result recieved a return call form the on call MD and informed him of the patient status and the request for an RX and he will send in an RX to the pharmcy

## 2015-12-10 ENCOUNTER — Encounter: Payer: Self-pay | Admitting: Family Medicine

## 2015-12-10 ENCOUNTER — Ambulatory Visit (INDEPENDENT_AMBULATORY_CARE_PROVIDER_SITE_OTHER): Payer: BLUE CROSS/BLUE SHIELD | Admitting: Family Medicine

## 2015-12-10 VITALS — BP 130/78 | HR 72 | Temp 98.2°F | Wt 248.8 lb

## 2015-12-10 DIAGNOSIS — E785 Hyperlipidemia, unspecified: Secondary | ICD-10-CM | POA: Diagnosis not present

## 2015-12-10 DIAGNOSIS — H6691 Otitis media, unspecified, right ear: Secondary | ICD-10-CM | POA: Insufficient documentation

## 2015-12-10 DIAGNOSIS — Z1211 Encounter for screening for malignant neoplasm of colon: Secondary | ICD-10-CM | POA: Diagnosis not present

## 2015-12-10 DIAGNOSIS — Z0000 Encounter for general adult medical examination without abnormal findings: Secondary | ICD-10-CM | POA: Diagnosis not present

## 2015-12-10 DIAGNOSIS — B351 Tinea unguium: Secondary | ICD-10-CM | POA: Insufficient documentation

## 2015-12-10 DIAGNOSIS — E669 Obesity, unspecified: Secondary | ICD-10-CM | POA: Insufficient documentation

## 2015-12-10 DIAGNOSIS — E1165 Type 2 diabetes mellitus with hyperglycemia: Secondary | ICD-10-CM

## 2015-12-10 DIAGNOSIS — H66001 Acute suppurative otitis media without spontaneous rupture of ear drum, right ear: Secondary | ICD-10-CM

## 2015-12-10 DIAGNOSIS — I10 Essential (primary) hypertension: Secondary | ICD-10-CM | POA: Diagnosis not present

## 2015-12-10 DIAGNOSIS — IMO0002 Reserved for concepts with insufficient information to code with codable children: Secondary | ICD-10-CM

## 2015-12-10 DIAGNOSIS — E118 Type 2 diabetes mellitus with unspecified complications: Secondary | ICD-10-CM

## 2015-12-10 DIAGNOSIS — Z6841 Body Mass Index (BMI) 40.0 and over, adult: Secondary | ICD-10-CM

## 2015-12-10 DIAGNOSIS — F4321 Adjustment disorder with depressed mood: Secondary | ICD-10-CM

## 2015-12-10 MED ORDER — AMOXICILLIN-POT CLAVULANATE 875-125 MG PO TABS
1.0000 | ORAL_TABLET | Freq: Two times a day (BID) | ORAL | Status: AC
Start: 2015-12-10 — End: 2015-12-20

## 2015-12-10 MED ORDER — TERBINAFINE HCL 250 MG PO TABS: 250.0000 mg | ORAL_TABLET | Freq: Every day | ORAL | Status: DC

## 2015-12-10 MED ORDER — METFORMIN HCL 500 MG PO TABS: 500.0000 mg | ORAL_TABLET | Freq: Every day | ORAL | Status: DC

## 2015-12-10 NOTE — Assessment & Plan Note (Signed)
Chronic and stable on current regimen.  Continue.

## 2015-12-10 NOTE — Assessment & Plan Note (Addendum)
With sinusitis - symptoms ongoing for 2 weeks. Treat with augmetnin antibiotic course. Update if not improving with treatment.

## 2015-12-10 NOTE — Addendum Note (Signed)
Addended by: Eustaquio BoydenGUTIERREZ, Cristle Jared on: 12/10/2015 11:25 AM   Modules accepted: Kipp BroodSmartSet

## 2015-12-10 NOTE — Assessment & Plan Note (Addendum)
Discussed prolonged grieving, slowed improvement noted. Pt planning on establishing with counselor. Has supportive family. Not interested in any pharmacotherapy.

## 2015-12-10 NOTE — Progress Notes (Addendum)
BP 130/78 mmHg  Pulse 72  Temp(Src) 98.2 F (36.8 C) (Oral)  Wt 248 lb 12 oz (112.832 kg)   CC: CPE  Subjective:    Patient ID: Roger Mcdaniel, male    DOB: 1950-12-16, 65 y.o.   MRN: [id number]  HPI: Roger Mcdaniel is a 65 y.o. male presenting on 12/10/2015 for Annual Exam   Last CPE 2013. Tough year - lost wife, mom, mother in law over 2 yrs. Wife passed 11/2014. Works part time at [employer] Planning on undergoing grievance counseling. Declines pharmacotherapy. Denies SI/HI. Has supportive family (son).   Cold sxs for last 2 weeks. R ear congestion. Throat tickle leading to cough with hoarse voice. Nyquil helpful. Over weekend treated with eye drop for pink eye. Foul taste in mouth.  Regularly takes aleve 446m in am and aleve PM in evening. Takes this for arthritis.   Noticing over last 2-3 yrs fungus of finger and toe nails.  Diabetic Foot Exam - Simple   Simple Foot Form  Diabetic Foot exam was performed with the following findings:  Yes 12/10/2015 10:03 AM  Visual Inspection  See comments:  Yes  Sensation Testing  Intact to touch and monofilament testing bilaterally:  Yes  Pulse Check  Posterior Tibialis and Dorsalis pulse intact bilaterally:  Yes  Comments  Dry skin at soles Mild maceration between 4th/5th toes B Onychomycosis throughout       Preventative: Colon cancer screening - does yearly stool kit. No h/o blood in stool. Would like to continue stool kit. Prostate cancer screening - discussed. would like to continue. Flu shot - declines.  Tdap 2012.  Seat belt use discussed.  Sunscreen use discussed. No changing moles on skin.  Married. 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 Interim medical history since our last visit reviewed. Allergies and medications  reviewed and updated. Current Outpatient Prescriptions on File Prior to Visit  Medication Sig  . aspirin 81 MG tablet Take 81 mg by mouth daily.  .Marland Kitchenatorvastatin (LIPITOR) 80 MG tablet Take 1 tablet (80 mg total) by mouth daily.  . B Complex-C-Folic Acid (STRESS B COMPLEX PO) Take 1 tablet by mouth daily.   . carvedilol (COREG) 6.25 MG tablet Take 1 tablet (6.25 mg total) by mouth 2 (two) times daily.  . hydrochlorothiazide (HYDRODIURIL) 25 MG tablet Take 1 tablet (25 mg total) by mouth daily.  .Marland Kitchenlisinopril (PRINIVIL,ZESTRIL) 20 MG tablet Take 1 tablet (20 mg total) by mouth daily.  . naproxen sodium (ANAPROX) 220 MG tablet Take 220 mg by mouth 2 (two) times daily with a meal.  . Omega-3 Fatty Acids (FISH OIL) 1000 MG CAPS Take by mouth 2 (two) times daily.  . potassium chloride (K-DUR) 10 MEQ tablet Take 1 tablet (10 mEq total) by mouth daily.  .Marland Kitchentrimethoprim-polymyxin b (POLYTRIM) ophthalmic solution Place 1 drop into both eyes every 4 (four) hours.  .Marland Kitchenzinc gluconate 50 MG tablet Take 50 mg by mouth daily.   No current facility-administered medications on file prior to visit.    Review of Systems  Constitutional: Negative for fever, chills, activity change, appetite change, fatigue and unexpected weight change.  HENT: Positive for congestion. Negative for hearing loss.   Eyes: Negative for visual disturbance.  Respiratory: Positive for cough (recent cold). Negative for chest tightness, shortness of  breath and wheezing.   Cardiovascular: Negative for chest pain, palpitations and leg swelling.  Gastrointestinal: Negative for nausea, vomiting, abdominal pain, diarrhea, constipation, blood in stool and abdominal distention.  Genitourinary: Negative for hematuria and difficulty urinating.  Musculoskeletal: Negative for myalgias, arthralgias and neck pain.  Skin: Negative for rash.  Neurological: Negative for dizziness, seizures, syncope and headaches.  Hematological: Negative for adenopathy.  Does not bruise/bleed easily.  Psychiatric/Behavioral: Positive for dysphoric mood. The patient is not nervous/anxious.        See HPI - planned counseling   Per HPI unless specifically indicated in ROS section     Objective:    BP 130/78 mmHg  Pulse 72  Temp(Src) 98.2 F (36.8 C) (Oral)  Wt 248 lb 12 oz (112.832 kg)  Wt Readings from Last 3 Encounters:  12/10/15 248 lb 12 oz (112.832 kg)  12/19/14 248 lb 12 oz (112.832 kg)  06/28/14 245 lb 8 oz (111.358 kg)   Body mass index is 42.68 kg/(m^2).  Physical Exam  Constitutional: He is oriented to person, place, and time. He appears well-developed and well-nourished. No distress.  HENT:  Head: Normocephalic and atraumatic.  Right Ear: Hearing, tympanic membrane, external ear and ear canal normal.  Left Ear: Hearing, tympanic membrane, external ear and ear canal normal.  Nose: Nose normal. No mucosal edema or rhinorrhea. Right sinus exhibits no maxillary sinus tenderness and no frontal sinus tenderness. Left sinus exhibits no maxillary sinus tenderness and no frontal sinus tenderness.  Mouth/Throat: Uvula is midline and mucous membranes are normal. Posterior oropharyngeal edema present. No oropharyngeal exudate, posterior oropharyngeal erythema or tonsillar abscesses.  Yellow fluid behind R TM with erythema of TM Dull L TM Congestion present  Eyes: Conjunctivae and EOM are normal. Pupils are equal, round, and reactive to light. No scleral icterus.  Neck: Normal range of motion. Neck supple. Carotid bruit is not present. No thyromegaly present.  Cardiovascular: Normal rate, regular rhythm, normal heart sounds and intact distal pulses.   No murmur heard. Pulses:      Radial pulses are 2+ on the right side, and 2+ on the left side.  Pulmonary/Chest: Effort normal and breath sounds normal. No respiratory distress. He has no wheezes. He has no rales.  Abdominal: Soft. Bowel sounds are normal. He exhibits no distension and no mass. There is  no tenderness. There is no rebound and no guarding.  Genitourinary: Rectum normal and prostate normal. Rectal exam shows no external hemorrhoid, no internal hemorrhoid, no fissure, no mass, no tenderness and anal tone normal. Prostate is not enlarged (20gm) and not tender.  Musculoskeletal: Normal range of motion. He exhibits no edema.  See HPI for foot exam if done  Lymphadenopathy:    He has no cervical adenopathy.  Neurological: He is alert and oriented to person, place, and time.  CN grossly intact, station and gait intact  Skin: Skin is warm and dry. No rash noted.  Psychiatric: He has a normal mood and affect. His behavior is normal. Judgment and thought content normal.  Nursing note and vitals reviewed.  Results for orders placed or performed in visit on 12/05/15  Lipid panel  Result Value Ref Range   Cholesterol 111 0 - 200 mg/dL   Triglycerides 121.0 0.0 - 149.0 mg/dL   HDL 31.70 (L) >39.00 mg/dL   VLDL 24.2 0.0 - 40.0 mg/dL   LDL Cholesterol 55 0 - 99 mg/dL   Total CHOL/HDL Ratio 3    NonHDL 78.85   Comprehensive  metabolic panel  Result Value Ref Range   Sodium 138 135 - 145 mEq/L   Potassium 4.3 3.5 - 5.1 mEq/L   Chloride 99 96 - 112 mEq/L   CO2 32 19 - 32 mEq/L   Glucose, Bld 170 (H) 70 - 99 mg/dL   BUN 15 6 - 23 mg/dL   Creatinine, Ser 0.93 0.40 - 1.50 mg/dL   Total Bilirubin 1.1 0.2 - 1.2 mg/dL   Alkaline Phosphatase 70 39 - 117 U/L   AST 15 0 - 37 U/L   ALT 19 0 - 53 U/L   Total Protein 6.6 6.0 - 8.3 g/dL   Albumin 4.0 3.5 - 5.2 g/dL   Calcium 9.2 8.4 - 10.5 mg/dL   GFR 87.09 >60.00 mL/min  Hemoglobin A1c  Result Value Ref Range   Hgb A1c MFr Bld 8.4 (H) 4.6 - 6.5 %  PSA  Result Value Ref Range   PSA 0.99 0.10 - 4.00 ng/mL      Assessment & Plan:   Problem List Items Addressed This Visit    Onychomycosis    Throughout hands and feet. Treat with terbinafine daily. Reassess at f/u in 3 mo. LFTs stable.      Relevant Medications   terbinafine  (LAMISIL) 250 MG tablet   Morbid obesity with body mass index (BMI) of 40.0 to 44.9 in adult [facility])    Discussed importance of weight loss to help control diabetes and other chronic medical conditions.      Relevant Medications   metFORMIN (GLUCOPHAGE) 500 MG tablet   Hyperlipidemia    Chronic, stable. Continue fish oil and atorvastatin.      Healthcare maintenance - Primary    Preventative protocols reviewed and updated unless pt declined. Discussed healthy diet and lifestyle.       Grieving    Discussed prolonged grieving, slowed improvement noted. Pt planning on establishing with counselor. Has supportive family. Not interested in any pharmacotherapy.      Essential hypertension, benign    Chronic and stable on current regimen. Continue.       Diabetes mellitus type 2, uncontrolled, with complications ([place])    Discussed new diagnosis and importance of weight loss and healthy diet changes.  Will refer to diabetes education and start metformin 588m daily. Foot exam today, rec schedule eye exam. Pneumovax next visit.      Relevant Medications   metFORMIN (GLUCOPHAGE) 500 MG tablet   Other Relevant Orders   Ambulatory referral to diabetic education   Acute otitis media, right    With sinusitis - symptoms ongoing for 2 weeks. Treat with augmetnin antibiotic course. Update if not improving with treatment.      Relevant Medications   amoxicillin-clavulanate (AUGMENTIN) 875-125 MG tablet   terbinafine (LAMISIL) 250 MG tablet    Other Visit Diagnoses    Special screening for malignant neoplasms, colon        Relevant Orders    Fecal occult blood, imunochemical        Follow up plan: Return in about 3 months (around 03/11/2016), or as needed, for follow up visit.

## 2015-12-10 NOTE — Assessment & Plan Note (Addendum)
Throughout hands and feet. Treat with terbinafine daily. Reassess at f/u in 3 mo. LFTs stable.

## 2015-12-10 NOTE — Assessment & Plan Note (Signed)
Preventative protocols reviewed and updated unless pt declined. Discussed healthy diet and lifestyle.  

## 2015-12-10 NOTE — Progress Notes (Signed)
Pre visit review using our clinic review tool, if applicable. No additional management support is needed unless otherwise documented below in the visit note. 

## 2015-12-10 NOTE — Patient Instructions (Addendum)
Pass by lab to pick up stool kit. Stop red yeast rice. Continue atorvastatin. Schedule eye exam as you're due. Start metformin 548m with breakfast. [facility]  Take antibiotic sent to pharmacy for ear infection/sinus infection. Let [place]now if not better with this. After antibiotics, start terbinafine daily for 4 weeks for nail fungus. Use lotrimin between toes.  Return in 3 months for diabetes follow up.  Health Maintenance, Male A healthy lifestyle and preventative care can promote health and wellness.  Maintain regular health, dental, and eye exams.  Eat a healthy diet. Foods like vegetables, fruits, whole grains, low-fat dairy products, and lean protein foods contain the nutrients you need and are low in calories. Decrease your intake of foods high in solid fats, added sugars, and salt. Get information about a proper diet from your health care provider, if necessary.  Regular physical exercise is one of the most important things you can do for your health. Most adults should get at least 150 minutes of moderate-intensity exercise (any activity that increases your heart rate and causes you to sweat) each week. In addition, most adults need muscle-strengthening exercises on 2 or more days a week.   Maintain a healthy weight. The body mass index (BMI) is a screening tool to identify possible weight problems. It provides an estimate of body fat based on height and weight. Your health care provider can find your BMI and can help you achieve or maintain a healthy weight. For males 20 years and older:  A BMI below 18.5 is considered underweight.  A BMI of 18.5 to 24.9 is normal.  A BMI of 25 to 29.9 is considered overweight.  A BMI of 30 and above is considered obese.  Maintain normal blood lipids and cholesterol by exercising and minimizing your intake of saturated fat. Eat a balanced diet with plenty of fruits and vegetables. Blood tests for lipids  and cholesterol should begin at age 986and be repeated every 5 years. If your lipid or cholesterol levels are high, you are over age 64 or you are at high risk for heart disease, you may need your cholesterol levels checked more frequently.Ongoing high lipid and cholesterol levels should be treated with medicines if diet and exercise are not working.  If you smoke, find out from your health care provider how to quit. If you do not use tobacco, do not start.  Lung cancer screening is recommended for adults aged 582-80years who are at high risk for developing lung cancer because of a history of smoking. A yearly low-dose CT scan of the lungs is recommended for people who have at least a 30-pack-year history of smoking and are current smokers or have quit within the past 15 years. A pack year of smoking is smoking an average of 1 pack of cigarettes a day for 1 year (for example, a 30-pack-year history of smoking could mean smoking 1 pack a day for 30 years or 2 packs a day for 15 years). Yearly screening should continue until the smoker has stopped smoking for at least 15 years. Yearly screening should be stopped for people who develop a health problem that would prevent them from having lung cancer treatment.  If you choose to drink alcohol, do not have more than 2 drinks per day. One drink is considered to be 12 oz (360 mL) of beer, 5 oz (150 mL) of wine, or 1.5 oz (45 mL) of liquor.  Avoid the use  of street drugs. Do not share needles with anyone. Ask for help if you need support or instructions about stopping the use of drugs.  High blood pressure causes heart disease and increases the risk of stroke. High blood pressure is more likely to develop in:  People who have blood pressure in the end of the normal range (100-139/85-89 mm Hg).  People who are overweight or obese.  People who are African American.  If you are 45-60 years of age, have your blood pressure checked every 3-5 years. If you are  24 years of age or older, have your blood pressure checked every year. You should have your blood pressure measured twice--once when you are at a hospital or clinic, and once when you are not at a hospital or clinic. Record the average of the two measurements. To check your blood pressure when you are not at a hospital or clinic, you can use:  An automated blood pressure machine at a pharmacy.  A home blood pressure monitor.  If you are 4-54 years old, ask your health care provider if you should take aspirin to prevent heart disease.  Diabetes screening involves taking a blood sample to check your fasting blood sugar level. This should be done once every 3 years after age 53 if you are at a normal weight and without risk factors for diabetes. Testing should be considered at a younger age or be carried out more frequently if you are overweight and have at least 1 risk factor for diabetes.  Colorectal cancer can be detected and often prevented. Most routine colorectal cancer screening begins at the age of 44 and continues through age 31. However, your health care provider may recommend screening at an earlier age if you have risk factors for colon cancer. On a yearly basis, your health care provider may provide home test kits to check for hidden blood in the stool. A small camera at the end of a tube may be used to directly examine the colon (sigmoidoscopy or colonoscopy) to detect the earliest forms of colorectal cancer. Talk to your health care provider about this at age 75 when routine screening begins. A direct exam of the colon should be repeated every 5-10 years through age 49, unless early forms of precancerous polyps or small growths are found.  People who are at an increased risk for hepatitis B should be screened for this virus. You are considered at high risk for hepatitis B if:  You were born in a country where hepatitis B occurs often. Talk with your health care provider about which  countries are considered high risk.  Your parents were born in a high-risk country and you have not received a shot to protect against hepatitis B (hepatitis B vaccine).  You have HIV or AIDS.  You use needles to inject street drugs.  You live with, or have sex with, someone who has hepatitis B.  You are a man who has sex with other men (MSM).  You get hemodialysis treatment.  You take certain medicines for conditions like cancer, organ transplantation, and autoimmune conditions.  Hepatitis C blood testing is recommended for all people born from [birth year] through 1964 and any individual with known risk factors for hepatitis C.  Healthy men should no longer receive prostate-specific antigen (PSA) blood tests as part of routine cancer screening. Talk to your health care provider about prostate cancer screening.  Testicular cancer screening is not recommended for adolescents or adult males who have no symptoms.  Screening includes self-exam, a health care provider exam, and other screening tests. Consult with your health care provider about any symptoms you have or any concerns you have about testicular cancer.  Practice safe sex. Use condoms and avoid high-risk sexual practices to reduce the spread of sexually transmitted infections (STIs).  You should be screened for STIs, including gonorrhea and chlamydia if:  You are sexually active and are younger than 24 years.  You are older than 24 years, and your health care provider tells you that you are at risk for this type of infection.  Your sexual activity has changed since you were last screened, and you are at an increased risk for chlamydia or gonorrhea. Ask your health care provider if you are at risk.  If you are at risk of being infected with HIV, it is recommended that you take a prescription medicine daily to prevent HIV infection. This is called pre-exposure prophylaxis (PrEP). You are considered at risk if:  You are a man who has  sex with other men (MSM).  You are a heterosexual man who is sexually active with multiple partners.  You take drugs by injection.  You are sexually active with a partner who has HIV.  Talk with your health care provider about whether you are at high risk of being infected with HIV. If you choose to begin PrEP, you should first be tested for HIV. You should then be tested every 3 months for as long as you are taking PrEP.  Use sunscreen. Apply sunscreen liberally and repeatedly throughout the day. You should seek shade when your shadow is shorter than you. Protect yourself by wearing long sleeves, pants, a wide-brimmed hat, and sunglasses year round whenever you are outdoors.  Tell your health care provider of new moles or changes in moles, especially if there is a change in shape or color. Also, tell your health care provider if a mole is larger than the size of a pencil eraser.  A one-time screening for abdominal aortic aneurysm (AAA) and surgical repair of large AAAs by ultrasound is recommended for men aged 51-75 years who are current or former smokers.  Stay current with your vaccines (immunizations).   This information is not intended to replace advice given to you by your health care provider. Make sure you discuss any questions you have with your health care provider.   Document Released: 03/11/2008 Document Revised: 10/04/2014 Document Reviewed: 02/08/2011 Elsevier Interactive Patient Education [employer].

## 2015-12-10 NOTE — Assessment & Plan Note (Signed)
Discussed new diagnosis and importance of weight loss and healthy diet changes.  Will refer to diabetes education and start metformin 500mg  daily. Foot exam today, rec schedule eye exam. Pneumovax next visit.

## 2015-12-10 NOTE — Assessment & Plan Note (Signed)
Discussed importance of weight loss to help control diabetes and other chronic medical conditions.

## 2015-12-10 NOTE — Assessment & Plan Note (Signed)
Chronic, stable. Continue fish oil and atorvastatin.

## 2015-12-15 ENCOUNTER — Other Ambulatory Visit: Payer: Self-pay

## 2015-12-15 MED ORDER — LISINOPRIL 20 MG PO TABS: 20.0000 mg | ORAL_TABLET | Freq: Every day | ORAL | Status: DC

## 2015-12-15 MED ORDER — POTASSIUM CHLORIDE ER 10 MEQ PO TBCR: 10.0000 meq | EXTENDED_RELEASE_TABLET | Freq: Every day | ORAL | Status: DC

## 2015-12-15 NOTE — Telephone Encounter (Signed)
Refill sent for Lisinopril 20 mg  

## 2015-12-15 NOTE — Telephone Encounter (Signed)
Refill sent for potassium cl 10 Meq

## 2015-12-22 ENCOUNTER — Other Ambulatory Visit: Payer: BLUE CROSS/BLUE SHIELD

## 2015-12-22 DIAGNOSIS — Z1211 Encounter for screening for malignant neoplasm of colon: Secondary | ICD-10-CM

## 2015-12-22 LAB — FECAL OCCULT BLOOD, GUAIAC: Fecal Occult Blood: NEGATIVE

## 2015-12-22 LAB — FECAL OCCULT BLOOD, IMMUNOCHEMICAL: Fecal Occult Bld: NEGATIVE

## 2015-12-23 ENCOUNTER — Encounter: Payer: Self-pay | Admitting: Cardiovascular Disease

## 2015-12-23 ENCOUNTER — Ambulatory Visit (INDEPENDENT_AMBULATORY_CARE_PROVIDER_SITE_OTHER): Payer: BLUE CROSS/BLUE SHIELD | Admitting: Cardiovascular Disease

## 2015-12-23 VITALS — BP 112/52 | HR 70 | Ht 64.0 in | Wt 244.0 lb

## 2015-12-23 DIAGNOSIS — I10 Essential (primary) hypertension: Secondary | ICD-10-CM

## 2015-12-23 DIAGNOSIS — E785 Hyperlipidemia, unspecified: Secondary | ICD-10-CM | POA: Diagnosis not present

## 2015-12-23 MED ORDER — HYDROCHLOROTHIAZIDE 25 MG PO TABS: 25.0000 mg | ORAL_TABLET | Freq: Every day | ORAL | Status: DC

## 2015-12-23 MED ORDER — CARVEDILOL 6.25 MG PO TABS: 6.2500 mg | ORAL_TABLET | Freq: Two times a day (BID) | ORAL | Status: DC

## 2015-12-23 MED ORDER — ATORVASTATIN CALCIUM 80 MG PO TABS: 80.0000 mg | ORAL_TABLET | Freq: Every day | ORAL | Status: DC

## 2015-12-23 NOTE — Progress Notes (Signed)
Cardiology Office Note   Date:  12/23/2015   ID:  Roger Mcdaniel 1950/12/03, MRN [id number]  PCP:  Eustaquio Boyden, MD  Cardiologist:   Elease Hashimoto Deloris Ping, MD   Chief Complaint  Patient presents with  . Hypertension   1. Hypertension 2. Obesity 3. Obstructive sleep apnea  History of Present Illness:  Roger Mcdaniel is a 65 year old gentleman with a history of hypertension and sleep apnea. I saw him approximately one month ago for hypertension. He has been gradually titrating his medications up and he still had episodes of hypertension. He's been limiting his salt intake. He denies any chest pain or shortness of breath.  He's been taking care of his granddaughter for the past 6 months. As result he's gained a lot of weight. He's not been able to exercise.  He was tried on Pravachol. He had lots of muscle aches and pains and had to stop the Pravachol. His muscle aches and pains have improved.  Feb 09, 2013:  Doing well from a cardiac standpoint. He had some generalized aches that lasted 3 days. Associated with some stomach issues.  Feb. 23, 2015:  Roger Mcdaniel is feeling well. He has been having some episodes of lightheadedness and has been found to have orthostatic hypotension. One is his 35 yo cousins diet this past week of a sudden heart attack. He has had some left shoulder / rotator cuff difficulities. He has had some leg swelling. He has not been exercising much.   06/28/2014:  Roger Mcdaniel is doing ok. Had neck surgery since I have seen him.  He has taken a new job - much less stress. He has lost > 10 lbs.    December 19, 2014:  Roger Mcdaniel is a 65 y.o. male who presents for follow up of his HTN Doing much better with his new job.  Exercising , walking regulary.   December 23, 2015:  Roger Mcdaniel is seen back for a 1 year visit. His wife died after leg surgery a year Has gained some weight  Has been diagnosed with DM type 2.   Has been going to diabetes classes and has lost  some weight. Using the stairs instead of the elevator .    Past Medical History  Diagnosis Date  . GERD (gastroesophageal reflux disease) 09/2003  . Hyperlipidemia 09/2003  . Hypertension 06/2004  . History of ETT 05/23/2000    Nml (Dr. Elease Hashimoto)  . Chest pain   . CMC arthritis, thumb, degenerative   . OSA (obstructive sleep apnea)     was on CPAP, currently off  . Kidney stones     takes Magnesium, Zinc and Fish oil, and B complex to prevent kidney stones  . Dry skin     Past Surgical History  Procedure Laterality Date  . Orif ankle fracture  1980    right  . Septoplasty  2005    for sleep apnea //cpap  . Anterior cervical decomp/discectomy fusion N/A 01/14/2014    Procedure: ANTERIOR CERVICAL DECOMPRESSION/DISCECTOMY FUSION CERVICAL FOUR-FIVE,CERVICAL FIVE-SIX ;  Surgeon: Hewitt Shorts, MD     Current Outpatient Prescriptions  Medication Sig Dispense Refill  . aspirin 81 MG tablet Take 81 mg by mouth daily.    [name] atorvastatin (LIPITOR) 80 MG tablet Take 1 tablet (80 mg total) by mouth daily. 90 tablet 3  . B Complex-C-Folic Acid (STRESS B COMPLEX PO) Take 1 tablet by mouth daily.     . hydrochlorothiazide (HYDRODIURIL) 25 MG tablet Take 1 tablet (  25 mg total) by mouth daily. 90 tablet 2  . lisinopril (PRINIVIL,ZESTRIL) 20 MG tablet Take 1 tablet (20 mg total) by mouth daily. 90 tablet 3  . Magnesium 250 MG TABS Take 1 tablet by mouth daily.    . metFORMIN (GLUCOPHAGE) 500 MG tablet Take 1 tablet (500 mg total) by mouth daily with breakfast. 90 tablet 3  . naproxen sodium (ANAPROX) 220 MG tablet Take 220 mg by mouth 2 (two) times daily with a meal.    . Omega-3 Fatty Acids (FISH OIL) 1000 MG CAPS Take by mouth 2 (two) times daily.    . potassium chloride (K-DUR) 10 MEQ tablet Take 1 tablet (10 mEq total) by mouth daily. 90 tablet 3  . terbinafine (LAMISIL) 250 MG tablet Take 1 tablet (250 mg total) by mouth daily. Start after augmentin course 45 tablet 0  . zinc gluconate  50 MG tablet Take 50 mg by mouth daily.    . carvedilol (COREG) 6.25 MG tablet Take 1 tablet (6.25 mg total) by mouth 2 (two) times daily. 180 tablet 3   No current facility-administered medications for this visit.    Allergies:   Codeine sulfate and Pravachol    Social History:  The patient  reports that he has never smoked. He has never used smokeless tobacco. He reports that he does not drink alcohol or use illicit drugs.   Family History:  The patient's family history includes CVA in his mother; Cancer in his other; Heart disease in his father and paternal grandfather; Hypertension in his brother; Obesity in his brother.    ROS:  Please see the history of present illness.    Review of Systems: Constitutional:  denies fever, chills, diaphoresis, appetite change and fatigue.  HEENT: denies photophobia, eye pain, redness, hearing loss, ear pain, congestion, sore throat, rhinorrhea, sneezing, neck pain, neck stiffness and tinnitus.  Respiratory: denies SOB, DOE, cough, chest tightness, and wheezing.  Cardiovascular: denies chest pain, palpitations and leg swelling.  Gastrointestinal: denies nausea, vomiting, abdominal pain, diarrhea, constipation, blood in stool.  Genitourinary: denies dysuria, urgency, frequency, hematuria, flank pain and difficulty urinating.  Musculoskeletal: denies  myalgias, back pain, joint swelling, arthralgias and gait problem.   Skin: denies pallor, rash and wound.  Neurological: denies dizziness, seizures, syncope, weakness, light-headedness, numbness and headaches.   Hematological: denies adenopathy, easy bruising, personal or family bleeding history.  Psychiatric/ Behavioral: denies suicidal ideation, mood changes, confusion, nervousness, sleep disturbance and agitation.       All other systems are reviewed and negative.    PHYSICAL EXAM: VS:  BP 112/52 mmHg  Pulse 70  Ht  (1.626 m)  Wt 244 lb (110.678 kg)  BMI 41.86 kg/m2 , BMI Body mass  index is 41.86 kg/(m^2). GEN: Well nourished, well developed, in no acute distress HEENT: normal Neck: no JVD, carotid bruits, or masses Cardiac: RRR; no murmurs, rubs, or gallops,no edema  Respiratory:  clear to auscultation bilaterally, normal work of breathing GI: soft, nontender, nondistended, + BS MS: no deformity or atrophy Skin: warm and dry, no rash Neuro:  Strength and sensation are intact Psych: normal   EKG:  EKG is not ordered today.    Recent Labs: 12/05/2015: ALT 19; BUN 15; Creatinine, Ser 0.93; Potassium 4.3; Sodium 138    Lipid Panel    Component Value Date/Time   CHOL 111 12/05/2015 0831   CHOL 106 12/16/2014 0832   TRIG 121.0 12/05/2015 0831   TRIG 107 04/02/2013 0739   HDL  31.70* 12/05/2015 0831   HDL 37* 12/16/2014 0832   HDL 38* 04/02/2013 0739   CHOLHDL 3 12/05/2015 0831   CHOLHDL 2.9 12/16/2014 0832   VLDL 24.2 12/05/2015 0831   LDLCALC 55 12/05/2015 0831   LDLCALC 54 12/16/2014 0832   LDLCALC 55 04/02/2013 0739   LDLDIRECT 143.5 04/27/2011 0816      Wt Readings from Last 3 Encounters:  12/23/15 244 lb (110.678 kg)  12/10/15 248 lb 12 oz (112.832 kg)  12/19/14 248 lb 12 oz (112.832 kg)      Other studies Reviewed: Additional studies/ records that were reviewed today include: . Review of the above records demonstrates:    ASSESSMENT AND PLAN:  1. Hypertension - BP is ok Continue current meds  2. Obesity - encouraged diet and exercise Recently was diagnosed with DM.   Doing better on diet and exercise  3. Obstructive sleep apnea  4. Hyperglycemia:  Glucose was 141 at his more recent BMP.  Will have him ask his primary medical doctor to address this .   5. Hyperlipidemia:    Continue meds.   Labs look great   Current medicines are reviewed at length with the patient today.  The patient does not have concerns regarding medicines.  The following changes have been made:  no change  Labs/ tests ordered today include:  No orders  of the defined types were placed in this encounter.     Disposition:   FU with me in 1 year.     Signed, Chaynce Schafer, Deloris PingPhilip J, MD  12/23/2015 8:22 AM    [facility] [address], [place], [place]  [address] Phone: [phone]; Fax: [phone]

## 2015-12-23 NOTE — Patient Instructions (Signed)
Medication Instructions:  Your physician recommends that you continue on your current medications as directed. Please refer to the Current Medication list given to you today. Refills of your medications have been sent   Labwork: None Ordered   Testing/Procedures: None Ordered   Follow-Up: Your physician wants you to follow-up in: 1 year with Dr. Elease HashimotoNahser.  You will receive a reminder letter in the mail two months in advance. If you don't receive a letter, please call our office to schedule the follow-up appointment.   If you need a refill on your cardiac medications before your next appointment, please call your pharmacy.   Thank you for choosing [facility]! Eligha BridegroomMichelle Swinyer, RN [phone]

## 2015-12-24 ENCOUNTER — Encounter: Payer: Self-pay | Admitting: *Deleted

## 2016-01-26 LAB — HM DIABETES EYE EXAM

## 2016-03-17 ENCOUNTER — Ambulatory Visit: Payer: BLUE CROSS/BLUE SHIELD | Admitting: Family Medicine

## 2016-03-31 ENCOUNTER — Encounter: Payer: Self-pay | Admitting: Family Medicine

## 2016-03-31 ENCOUNTER — Ambulatory Visit (INDEPENDENT_AMBULATORY_CARE_PROVIDER_SITE_OTHER): Payer: BLUE CROSS/BLUE SHIELD | Admitting: Family Medicine

## 2016-03-31 ENCOUNTER — Telehealth: Payer: Self-pay

## 2016-03-31 VITALS — BP 126/72 | HR 72 | Temp 98.1°F | Wt 217.8 lb

## 2016-03-31 DIAGNOSIS — B351 Tinea unguium: Secondary | ICD-10-CM

## 2016-03-31 DIAGNOSIS — E1165 Type 2 diabetes mellitus with hyperglycemia: Secondary | ICD-10-CM | POA: Diagnosis not present

## 2016-03-31 DIAGNOSIS — E785 Hyperlipidemia, unspecified: Secondary | ICD-10-CM

## 2016-03-31 DIAGNOSIS — E118 Type 2 diabetes mellitus with unspecified complications: Secondary | ICD-10-CM

## 2016-03-31 DIAGNOSIS — I10 Essential (primary) hypertension: Secondary | ICD-10-CM | POA: Diagnosis not present

## 2016-03-31 DIAGNOSIS — IMO0002 Reserved for concepts with insufficient information to code with codable children: Secondary | ICD-10-CM

## 2016-03-31 DIAGNOSIS — IMO0001 Reserved for inherently not codable concepts without codable children: Secondary | ICD-10-CM

## 2016-03-31 LAB — COMPREHENSIVE METABOLIC PANEL
ALT: 21 U/L (ref 0–53)
AST: 27 U/L (ref 0–37)
Albumin: 4.1 g/dL (ref 3.5–5.2)
Alkaline Phosphatase: 59 U/L (ref 39–117)
BILIRUBIN TOTAL: 1.1 mg/dL (ref 0.2–1.2)
BUN: 30 mg/dL — AB (ref 6–23)
CO2: 30 meq/L (ref 19–32)
Calcium: 9.4 mg/dL (ref 8.4–10.5)
Chloride: 100 mEq/L (ref 96–112)
Creatinine, Ser: 1.13 mg/dL (ref 0.40–1.50)
GFR: 69.48 mL/min (ref >60.00)
GLUCOSE: 97 mg/dL (ref 70–99)
Potassium: 3.9 mEq/L (ref 3.5–5.1)
SODIUM: 136 meq/L (ref 135–145)
Total Protein: 6.7 g/dL (ref 6.0–8.3)

## 2016-03-31 LAB — LIPID PANEL
CHOL/HDL RATIO: 3
Cholesterol: 99 mg/dL (ref 0–200)
HDL: 35.9 mg/dL — AB (ref >39.00)
LDL Cholesterol: 50 mg/dL (ref 0–99)
NONHDL: 63.04
Triglycerides: 63 mg/dL (ref 0.0–149.0)
VLDL: 12.6 mg/dL (ref 0.0–40.0)

## 2016-03-31 LAB — HEMOGLOBIN A1C: Hgb A1c MFr Bld: 5.9 % (ref 4.6–6.5)

## 2016-03-31 MED ORDER — TERBINAFINE HCL 250 MG PO TABS: 250.0000 mg | ORAL_TABLET | Freq: Every day | ORAL | Status: DC

## 2016-03-31 NOTE — Progress Notes (Signed)
Pre visit review using our clinic review tool, if applicable. No additional management support is needed unless otherwise documented below in the visit note. 

## 2016-03-31 NOTE — Assessment & Plan Note (Signed)
Chronic, stable. Recheck FLP. On fish oil and atorvastatin - anticipate will be able to decrease meds given recent weight loss.

## 2016-03-31 NOTE — Patient Instructions (Addendum)
Great job with weight loss! Keep it up.  Decrease hydrochlorothiazide to 12.5mg  daily (1/2 tablet).  Labs today. We will titrate medicines accordingly. May be able to decrease blood pressure and cholesterol medicines.  Return as needed or in 4 months for follow up visit

## 2016-03-31 NOTE — Progress Notes (Signed)
BP 126/72 mmHg  Pulse 72  Temp(Src) 98.1 F (36.7 C) (Oral)  Wt 217 lb 12 oz (98.771 kg)   CC: 4 mo f/u visit  Subjective:    Patient ID: Roger Mcdaniel, male    DOB: 1950/11/29, 65 y.o.   MRN: [id number]  HPI: Roger Mcdaniel is a 65 y.o. male presenting on 03/31/2016 for Follow-up   31 lb weight loss over last 4 months! Changed eating habits - fresh vegetables, fruits. Tries to stay active.   Onychomycosis - treated with terbinafine x 6 wks. Fingernails doing better, toenails still having infection. Requests refill.   DM - new diagnosis last visit. We started metformin, he underwent DSME, and lost significant weight. Rpt labs today, anticipate will be able to taper off some of his medications given marked weight loss.  Lab Results  Component Value Date   HGBA1C 8.4* 12/05/2015    BP Readings from Last 3 Encounters:  03/31/16 126/72  12/23/15 112/52  12/10/15 130/78    HTN - Compliant with current antihypertensive regimen of lisinopril 20, hctx 25, and coreg 6.25mg  bid. Does check blood pressures at home: and brings log. Denies HA, vision changes, CP/tightness, SOB, leg swelling. Yesterday after 4 hours pressure washing, blood pressure dropped to 80/50s with HR 90s, improved after drinking 2 glasses of crystal light. Has not taken am meds yet.  Relevant past medical, surgical, family and social history reviewed and updated as indicated. Interim medical history since our last visit reviewed. Allergies and medications reviewed and updated. Current Outpatient Prescriptions on File Prior to Visit  Medication Sig  . aspirin 81 MG tablet Take 81 mg by mouth daily.  [name]. atorvastatin (LIPITOR) 80 MG tablet Take 1 tablet (80 mg total) by mouth daily.  . B Complex-C-Folic Acid (STRESS B COMPLEX PO) Take 1 tablet by mouth daily.   . carvedilol (COREG) 6.25 MG tablet Take 1 tablet (6.25 mg total) by mouth 2 (two) times daily.  [name]. lisinopril (PRINIVIL,ZESTRIL) 20 MG tablet Take 1 tablet (20 mg  total) by mouth daily.  . Magnesium 250 MG TABS Take 1 tablet by mouth daily.  . metFORMIN (GLUCOPHAGE) 500 MG tablet Take 1 tablet (500 mg total) by mouth daily with breakfast.  . naproxen sodium (ANAPROX) 220 MG tablet Take 220 mg by mouth 2 (two) times daily with a meal.  . Omega-3 Fatty Acids (FISH OIL) 1000 MG CAPS Take by mouth 2 (two) times daily.  . potassium chloride (K-DUR) 10 MEQ tablet Take 1 tablet (10 mEq total) by mouth daily.  [name]. zinc gluconate 50 MG tablet Take 50 mg by mouth daily.   No current facility-administered medications on file prior to visit.    Review of Systems Per HPI unless specifically indicated in ROS section     Objective:    BP 126/72 mmHg  Pulse 72  Temp(Src) 98.1 F (36.7 C) (Oral)  Wt 217 lb 12 oz (98.771 kg)  Wt Readings from Last 3 Encounters:  03/31/16 217 lb 12 oz (98.771 kg)  12/23/15 244 lb (110.678 kg)  12/10/15 248 lb 12 oz (112.832 kg)   Body mass index is 37.36 kg/(m^2).  Physical Exam  Constitutional: He appears well-developed and well-nourished. No distress.  HENT:  Head: Normocephalic and atraumatic.  Right Ear: External ear normal.  Left Ear: External ear normal.  Nose: Nose normal.  Mouth/Throat: Oropharynx is clear and moist. No oropharyngeal exudate.  Eyes: Conjunctivae and EOM are normal. Pupils are equal, round, and  reactive to light. No scleral icterus.  Neck: Normal range of motion. Neck supple.  Cardiovascular: Normal rate, regular rhythm, normal heart sounds and intact distal pulses.   No murmur heard. Pulmonary/Chest: Effort normal and breath sounds normal. No respiratory distress. He has no wheezes. He has no rales.  Musculoskeletal: He exhibits no edema.  See HPI for foot exam if done  Lymphadenopathy:    He has no cervical adenopathy.  Skin: Skin is warm and dry. No rash noted.  Psychiatric: He has a normal mood and affect.  Nursing note and vitals reviewed.     Assessment & Plan:   Problem List Items  Addressed This Visit    Hyperlipidemia    Chronic, stable. Recheck FLP. On fish oil and atorvastatin - anticipate will be able to decrease meds given recent weight loss.       Relevant Medications   hydrochlorothiazide (HYDRODIURIL) 25 MG tablet   Other Relevant Orders   Lipid panel   Comprehensive metabolic panel   Essential hypertension, benign    Chronic, stable. Anticipate some over treatment now given weight loss. Decrease hctz to 12.5mg  daily - check K and Cr and further titrate meds accordingly. Has not taking am bp meds yet.      Relevant Medications   hydrochlorothiazide (HYDRODIURIL) 25 MG tablet   Diabetes mellitus type 2, uncontrolled, with complications (HCC) - Primary    Chronic, improved. Anticipate will be able to titrate off metformin given 30 lb weight loss over last 4 months. Congratulated.       Relevant Orders   Hemoglobin A1c   Obesity, Class II, BMI 35-39.9, with comorbidity (HCC)    30lb weight loss over the last 4 months! Congratulated. Continue healthy diet and lifestyle changes as up to now.       Onychomycosis    Fingernails improved. Toe nails persistent onychomycosis - will check LFTs and if stable, provide with 1 more terbinafine course.       Relevant Orders   Comprehensive metabolic panel       Follow up plan: Return in about 4 months (around 08/01/2016) for follow up visit.  Eustaquio BoydenJavier Ponciano Shealy, MD

## 2016-03-31 NOTE — Assessment & Plan Note (Signed)
Fingernails improved. Toe nails persistent onychomycosis - will check LFTs and if stable, provide with 1 more terbinafine course.

## 2016-03-31 NOTE — Assessment & Plan Note (Addendum)
30lb weight loss over the last 4 months! Congratulated. Continue healthy diet and lifestyle changes as up to now.

## 2016-03-31 NOTE — Assessment & Plan Note (Signed)
Chronic, stable. Anticipate some over treatment now given weight loss. Decrease hctz to 12.5mg  daily - check K and Cr and further titrate meds accordingly. Has not taking am bp meds yet.

## 2016-03-31 NOTE — Telephone Encounter (Signed)
Noted. Agree. LFTs normal - will refill terbinafine. Sent to pharmacy.

## 2016-03-31 NOTE — Assessment & Plan Note (Signed)
Chronic, improved. Anticipate will be able to titrate off metformin given 30 lb weight loss over last 4 months. Congratulated.

## 2016-03-31 NOTE — Telephone Encounter (Signed)
Pt was seen earlier today and pt thought terbinafine was going to be sent to pharmacy; advised pt from 03/31/16 note that Dr Reece AgarG was going to ck LFT first and then possibly prescribe Terbinafine for toenail fungus. Pt voiced understanding and will wait for cb on 04/01/16 about lab results. FYI to Dr Reece AgarG.

## 2016-04-01 NOTE — Telephone Encounter (Signed)
Patient notified and verbalized understanding. 

## 2016-04-04 ENCOUNTER — Other Ambulatory Visit: Payer: Self-pay | Admitting: Family Medicine

## 2016-04-04 ENCOUNTER — Encounter: Payer: Self-pay | Admitting: Family Medicine

## 2016-04-05 NOTE — Telephone Encounter (Signed)
Noted  

## 2016-04-22 ENCOUNTER — Encounter: Payer: Self-pay | Admitting: Family Medicine

## 2016-05-22 ENCOUNTER — Encounter: Payer: Self-pay | Admitting: Family Medicine

## 2016-07-14 ENCOUNTER — Encounter: Payer: Self-pay | Admitting: Family Medicine

## 2016-07-28 DIAGNOSIS — E1169 Type 2 diabetes mellitus with other specified complication: Secondary | ICD-10-CM | POA: Insufficient documentation

## 2016-07-28 DIAGNOSIS — E118 Type 2 diabetes mellitus with unspecified complications: Secondary | ICD-10-CM | POA: Insufficient documentation

## 2016-07-28 DIAGNOSIS — E1136 Type 2 diabetes mellitus with diabetic cataract: Secondary | ICD-10-CM | POA: Insufficient documentation

## 2016-07-28 DIAGNOSIS — Z8639 Personal history of other endocrine, nutritional and metabolic disease: Secondary | ICD-10-CM

## 2016-08-11 ENCOUNTER — Other Ambulatory Visit (INDEPENDENT_AMBULATORY_CARE_PROVIDER_SITE_OTHER): Payer: BLUE CROSS/BLUE SHIELD

## 2016-08-11 ENCOUNTER — Other Ambulatory Visit: Payer: Self-pay | Admitting: Family Medicine

## 2016-08-11 DIAGNOSIS — IMO0002 Reserved for concepts with insufficient information to code with codable children: Secondary | ICD-10-CM

## 2016-08-11 DIAGNOSIS — R634 Abnormal weight loss: Secondary | ICD-10-CM

## 2016-08-11 DIAGNOSIS — E1165 Type 2 diabetes mellitus with hyperglycemia: Secondary | ICD-10-CM

## 2016-08-11 DIAGNOSIS — I10 Essential (primary) hypertension: Secondary | ICD-10-CM | POA: Diagnosis not present

## 2016-08-11 DIAGNOSIS — E118 Type 2 diabetes mellitus with unspecified complications: Secondary | ICD-10-CM

## 2016-08-11 DIAGNOSIS — E785 Hyperlipidemia, unspecified: Secondary | ICD-10-CM

## 2016-08-11 LAB — CBC WITH DIFFERENTIAL/PLATELET
Basophils Absolute: 0 10*3/uL (ref 0.0–0.1)
Basophils Relative: 0.2 % (ref 0.0–3.0)
EOS ABS: 0.2 10*3/uL (ref 0.0–0.7)
Eosinophils Relative: 3.5 % (ref 0.0–5.0)
HCT: 41.3 % (ref 39.0–52.0)
HEMOGLOBIN: 14.1 g/dL (ref 13.0–17.0)
Lymphocytes Relative: 24.9 % (ref 12.0–46.0)
Lymphs Abs: 1.6 10*3/uL (ref 0.7–4.0)
MCHC: 34.2 g/dL (ref 30.0–36.0)
MCV: 88.1 fl (ref 78.0–100.0)
MONO ABS: 0.8 10*3/uL (ref 0.1–1.0)
Monocytes Relative: 12.1 % — ABNORMAL HIGH (ref 3.0–12.0)
Neutro Abs: 3.8 10*3/uL (ref 1.4–7.7)
Neutrophils Relative %: 59.3 % (ref 43.0–77.0)
Platelets: 293 10*3/uL (ref 150.0–400.0)
RBC: 4.68 Mil/uL (ref 4.22–5.81)
RDW: 13.5 % (ref 11.5–15.5)
WBC: 6.4 10*3/uL (ref 4.0–10.5)

## 2016-08-11 LAB — LIPID PANEL
CHOL/HDL RATIO: 3
Cholesterol: 118 mg/dL (ref 0–200)
HDL: 42.2 mg/dL (ref >39.00)
LDL CALC: 65 mg/dL (ref 0–99)
NonHDL: 75.48
TRIGLYCERIDES: 53 mg/dL (ref 0.0–149.0)
VLDL: 10.6 mg/dL (ref 0.0–40.0)

## 2016-08-11 LAB — BASIC METABOLIC PANEL
BUN: 21 mg/dL (ref 6–23)
CALCIUM: 9.3 mg/dL (ref 8.4–10.5)
CO2: 30 mEq/L (ref 19–32)
CREATININE: 0.92 mg/dL (ref 0.40–1.50)
Chloride: 102 mEq/L (ref 96–112)
GFR: 87.99 mL/min (ref >60.00)
Glucose, Bld: 100 mg/dL — ABNORMAL HIGH (ref 70–99)
Potassium: 4.8 mEq/L (ref 3.5–5.1)
Sodium: 137 mEq/L (ref 135–145)

## 2016-08-11 LAB — HEMOGLOBIN A1C: HEMOGLOBIN A1C: 5.5 % (ref 4.6–6.5)

## 2016-08-18 ENCOUNTER — Encounter: Payer: Self-pay | Admitting: Family Medicine

## 2016-08-18 ENCOUNTER — Ambulatory Visit (INDEPENDENT_AMBULATORY_CARE_PROVIDER_SITE_OTHER): Payer: BLUE CROSS/BLUE SHIELD | Admitting: Family Medicine

## 2016-08-18 VITALS — BP 118/74 | HR 72 | Temp 98.2°F | Wt 204.5 lb

## 2016-08-18 DIAGNOSIS — E669 Obesity, unspecified: Secondary | ICD-10-CM

## 2016-08-18 DIAGNOSIS — E118 Type 2 diabetes mellitus with unspecified complications: Secondary | ICD-10-CM

## 2016-08-18 DIAGNOSIS — IMO0001 Reserved for inherently not codable concepts without codable children: Secondary | ICD-10-CM

## 2016-08-18 DIAGNOSIS — E785 Hyperlipidemia, unspecified: Secondary | ICD-10-CM

## 2016-08-18 DIAGNOSIS — G4700 Insomnia, unspecified: Secondary | ICD-10-CM | POA: Insufficient documentation

## 2016-08-18 DIAGNOSIS — E1165 Type 2 diabetes mellitus with hyperglycemia: Secondary | ICD-10-CM

## 2016-08-18 DIAGNOSIS — I10 Essential (primary) hypertension: Secondary | ICD-10-CM

## 2016-08-18 DIAGNOSIS — IMO0002 Reserved for concepts with insufficient information to code with codable children: Secondary | ICD-10-CM

## 2016-08-18 DIAGNOSIS — F5101 Primary insomnia: Secondary | ICD-10-CM

## 2016-08-18 NOTE — Assessment & Plan Note (Signed)
Chronic, stable on current regimen off fish oil. Continue atorvastatin 40mg  daily - consider decreased dose next visit.

## 2016-08-18 NOTE — Assessment & Plan Note (Signed)
Marked improvement with weight loss - will continue slow taper off medication. Stop coreg. Continue lisinopril 10mg  daily.

## 2016-08-18 NOTE — Progress Notes (Signed)
BP 118/74   Pulse 72   Temp 98.2 F (36.8 C) (Oral)   Wt 204 lb 8 oz (92.8 kg)   BMI 35.10 kg/m    CC: 4 mo f/u visit Subjective:    Patient ID: Roger Mcdaniel, male    DOB: 07-Feb-1951, 65 y.o.   MRN: [id number]  HPI: Roger Mcdaniel is a 65 y.o. male presenting on 08/18/2016 for Follow-up   Weight 248 lbs (12/10/2015) --> 204 lbs (today). Total of 44 lbs over the past year. Changed eating habits - fresh vegetables, fruits. Tries to stay active. Weight loss has led to glycemic control off medications. He has also been able to taper off antihypertensives. [place] classes.   Some insomnia noted.   Relevant past medical, surgical, family and social history reviewed and updated as indicated. Interim medical history since our last visit reviewed. Allergies and medications reviewed and updated. Current Outpatient Prescriptions on File Prior to Visit  Medication Sig  . aspirin 81 MG tablet Take 81 mg by mouth daily.  [name]. atorvastatin (LIPITOR) 80 MG tablet Take 0.5 tablets (40 mg total) by mouth daily.  [name]. lisinopril (PRINIVIL,ZESTRIL) 20 MG tablet Take 0.5 tablets (10 mg total) by mouth daily.  . Magnesium 250 MG TABS Take 1 tablet by mouth daily.  . naproxen sodium (ANAPROX) 220 MG tablet Take 440 mg by mouth daily.   [name]. zinc gluconate 50 MG tablet Take 50 mg by mouth daily.   No current facility-administered medications on file prior to visit.     Review of Systems Per HPI unless specifically indicated in ROS section     Objective:    BP 118/74   Pulse 72   Temp 98.2 F (36.8 C) (Oral)   Wt 204 lb 8 oz (92.8 kg)   BMI 35.10 kg/m   Wt Readings from Last 3 Encounters:  08/18/16 204 lb 8 oz (92.8 kg)  03/31/16 217 lb 12 oz (98.8 kg)  12/23/15 244 lb (110.7 kg)    Physical Exam  Constitutional: He appears well-developed and well-nourished. No distress.  HENT:  Mouth/Throat: Oropharynx is clear and moist. No oropharyngeal exudate.    Cardiovascular: Normal rate, regular rhythm, normal heart sounds and intact distal pulses.   No murmur heard. Pulmonary/Chest: Effort normal and breath sounds normal. No respiratory distress. He has no wheezes. He has no rales.  Musculoskeletal: He exhibits no edema.  Skin: Skin is warm and dry. No rash noted.  Psychiatric: He has a normal mood and affect.  Nursing note and vitals reviewed.  Results for orders placed or performed in visit on 08/18/16  HM DIABETES EYE EXAM  Result Value Ref Range   HM Diabetic Eye Exam No Retinopathy No Retinopathy  HM DIABETES EYE EXAM  Result Value Ref Range   HM Diabetic Eye Exam No Retinopathy No Retinopathy   Lab Results  Component Value Date   HGBA1C 5.5 08/11/2016   Lab Results  Component Value Date   CHOL 118 08/11/2016   HDL 42.20 08/11/2016   LDLCALC 65 08/11/2016   LDLDIRECT 143.5 04/27/2011   TRIG 53.0 08/11/2016   CHOLHDL 3 08/11/2016      Assessment & Plan:   Problem List Items Addressed This Visit    RESOLVED: Diabetes mellitus type 2, uncontrolled, with complications (HCC)    This has resolved with marked weight loss. Congratulated patient.       Essential hypertension, benign - Primary    Marked improvement with  weight loss - will continue slow taper off medication. Stop coreg. Continue lisinopril 10mg  daily.        Hyperlipidemia    Chronic, stable on current regimen off fish oil. Continue atorvastatin 40mg  daily - consider decreased dose next visit.      Insomnia    Suggested trial melatonin.      Obesity, Class II, BMI 35-39.9, with comorbidity    44 lb weight loss. congraulated on healthy diet changes to date. Pt motivated to continue this. Pt endorses goal weight of ~185lbs.           Follow up plan: Return in about 4 months (around 12/16/2016) for annual exam, prior fasting for blood work.  Eustaquio BoydenJavier Latonya Knight, MD

## 2016-08-18 NOTE — Progress Notes (Signed)
Pre visit review using our clinic review tool, if applicable. No additional management support is needed unless otherwise documented below in the visit note. 

## 2016-08-18 NOTE — Patient Instructions (Addendum)
Congratulations on weight loss and healthy diet changes!  Let's stop carvedilol. Continue 1/2 lisinopril and atorvastatin.  Try melatonin 5-10mg  for sleep at night.  Return in 4 months for physical

## 2016-08-18 NOTE — Assessment & Plan Note (Signed)
This has resolved with marked weight loss. Congratulated patient.

## 2016-08-18 NOTE — Assessment & Plan Note (Signed)
Suggested trial melatonin. 

## 2016-08-18 NOTE — Assessment & Plan Note (Signed)
44 lb weight loss. congraulated on healthy diet changes to date. Pt motivated to continue this. Pt endorses goal weight of ~185lbs.

## 2016-11-10 ENCOUNTER — Encounter: Payer: Self-pay | Admitting: Family Medicine

## 2016-11-29 ENCOUNTER — Other Ambulatory Visit: Payer: Self-pay | Admitting: Family Medicine

## 2016-11-29 DIAGNOSIS — Z125 Encounter for screening for malignant neoplasm of prostate: Secondary | ICD-10-CM

## 2016-11-29 DIAGNOSIS — E785 Hyperlipidemia, unspecified: Secondary | ICD-10-CM

## 2016-12-03 ENCOUNTER — Other Ambulatory Visit (INDEPENDENT_AMBULATORY_CARE_PROVIDER_SITE_OTHER): Payer: BLUE CROSS/BLUE SHIELD

## 2016-12-03 DIAGNOSIS — E785 Hyperlipidemia, unspecified: Secondary | ICD-10-CM | POA: Diagnosis not present

## 2016-12-03 DIAGNOSIS — Z125 Encounter for screening for malignant neoplasm of prostate: Secondary | ICD-10-CM

## 2016-12-03 LAB — BASIC METABOLIC PANEL
BUN: 21 mg/dL (ref 6–23)
CO2: 31 mEq/L (ref 19–32)
CREATININE: 0.94 mg/dL (ref 0.40–1.50)
Calcium: 9.5 mg/dL (ref 8.4–10.5)
Chloride: 103 mEq/L (ref 96–112)
GFR: 85.75 mL/min (ref >60.00)
Glucose, Bld: 96 mg/dL (ref 70–99)
POTASSIUM: 4.8 meq/L (ref 3.5–5.1)
Sodium: 139 mEq/L (ref 135–145)

## 2016-12-03 LAB — PSA: PSA: 1.36 ng/mL (ref 0.10–4.00)

## 2016-12-03 LAB — LIPID PANEL
CHOL/HDL RATIO: 3
Cholesterol: 112 mg/dL (ref 0–200)
HDL: 39.1 mg/dL (ref >39.00)
LDL CALC: 60 mg/dL (ref 0–99)
NONHDL: 73.11
TRIGLYCERIDES: 65 mg/dL (ref 0.0–149.0)
VLDL: 13 mg/dL (ref 0.0–40.0)

## 2016-12-10 ENCOUNTER — Encounter: Payer: Self-pay | Admitting: Family Medicine

## 2016-12-10 ENCOUNTER — Ambulatory Visit (INDEPENDENT_AMBULATORY_CARE_PROVIDER_SITE_OTHER): Payer: BLUE CROSS/BLUE SHIELD | Admitting: Family Medicine

## 2016-12-10 VITALS — BP 116/76 | HR 62 | Temp 98.0°F | Ht 64.5 in | Wt 201.2 lb

## 2016-12-10 DIAGNOSIS — G4730 Sleep apnea, unspecified: Secondary | ICD-10-CM | POA: Diagnosis not present

## 2016-12-10 DIAGNOSIS — F4320 Adjustment disorder, unspecified: Secondary | ICD-10-CM | POA: Diagnosis not present

## 2016-12-10 DIAGNOSIS — Z1211 Encounter for screening for malignant neoplasm of colon: Secondary | ICD-10-CM | POA: Diagnosis not present

## 2016-12-10 DIAGNOSIS — Z0000 Encounter for general adult medical examination without abnormal findings: Secondary | ICD-10-CM

## 2016-12-10 DIAGNOSIS — F4321 Adjustment disorder with depressed mood: Secondary | ICD-10-CM

## 2016-12-10 DIAGNOSIS — E785 Hyperlipidemia, unspecified: Secondary | ICD-10-CM

## 2016-12-10 DIAGNOSIS — E669 Obesity, unspecified: Secondary | ICD-10-CM | POA: Diagnosis not present

## 2016-12-10 DIAGNOSIS — F5101 Primary insomnia: Secondary | ICD-10-CM | POA: Diagnosis not present

## 2016-12-10 DIAGNOSIS — I10 Essential (primary) hypertension: Secondary | ICD-10-CM | POA: Diagnosis not present

## 2016-12-10 DIAGNOSIS — IMO0001 Reserved for inherently not codable concepts without codable children: Secondary | ICD-10-CM

## 2016-12-10 MED ORDER — LISINOPRIL 5 MG PO TABS
5.0000 mg | ORAL_TABLET | Freq: Every day | ORAL | 3 refills | Status: DC
Start: 2016-12-10 — End: 2017-11-23

## 2016-12-10 MED ORDER — LISINOPRIL 10 MG PO TABS: 10.0000 mg | ORAL_TABLET | Freq: Every day | ORAL | 3 refills | Status: DC

## 2016-12-10 MED ORDER — ATORVASTATIN CALCIUM 40 MG PO TABS: 40.0000 mg | ORAL_TABLET | Freq: Every day | ORAL | 3 refills | Status: DC

## 2016-12-10 NOTE — Assessment & Plan Note (Signed)
Congratulated on ongoing weight loss through healthy diet and lifestyle changes. Motivated to continue.

## 2016-12-10 NOTE — Assessment & Plan Note (Signed)
Controlled with melatonin 5-10mg  nightly.

## 2016-12-10 NOTE — Progress Notes (Signed)
BP 116/76   Pulse 62   Temp 98 F (36.7 C) (Oral)   Ht 5' 4.5" (1.638 m)   Wt 201 lb 4 oz (91.3 kg)   SpO2 98%   BMI 34.01 kg/m    CC: CPE Subjective:    Patient ID: Roger Mcdaniel, male    DOB: [birth year], 66 y.o.   MRN: [id number]  HPI: Roger Mcdaniel is a 66 y.o. male presenting on 12/10/2016 for Annual Exam   Marked healthy lifestyle and diet changes leading to weight loss of 47lbs, glycemic control off medications, and tapering of antihypertensives. Very happy with success.   Some hand and knee arthritis aches, treats with aleve PM at night.  bp staying 103-159 systolic, low 458P in the evenings.  OSA - takes melatonin at night. Does not use CPAP.   Preventative: Colon cancer screening - does yearly stool kit.No h/o blood in stool.Would like to continue stool kit. Prostate cancer screening - discussed. Would like to continue. Flu shot - 06/2016 Tdap 2012.  Seat belt use discussed.  Sunscreen use discussed. No changing moles on skin. Non smoker Alcohol - none  Married. Widower - wife passed 11/2014 Children: 2 out of house Occ: retired, was Software engineer of [employer] Activity: no regular exercise  Relevant past medical, surgical, family and social history reviewed and updated as indicated. Interim medical history since our last visit reviewed. Allergies and medications reviewed and updated. Outpatient Medications Prior to Visit  Medication Sig Dispense Refill  . aspirin 81 MG tablet Take 81 mg by mouth daily.    . Magnesium 250 MG TABS Take 1 tablet by mouth daily.    . Melatonin 10 MG TABS Take 1 tablet by mouth at bedtime.    . Naproxen Sod-Diphenhydramine (ALEVE PM) 220-25 MG TABS Take 2 capsules by mouth at bedtime.    . naproxen sodium (ANAPROX) 220 MG tablet Take 440 mg by mouth daily.     [name] zinc gluconate 50 MG tablet Take 50 mg by mouth daily.    [name] atorvastatin (LIPITOR) 80 MG tablet Take 0.5 tablets (40 mg total) by mouth daily.    [name] lisinopril  (PRINIVIL,ZESTRIL) 20 MG tablet Take 0.5 tablets (10 mg total) by mouth daily.     No facility-administered medications prior to visit.      Per HPI unless specifically indicated in ROS section below Review of Systems  Constitutional: Negative for activity change, appetite change, chills, fatigue, fever and unexpected weight change.  HENT: Negative for hearing loss.   Eyes: Negative for visual disturbance.  Respiratory: Negative for cough, chest tightness, shortness of breath and wheezing.   Cardiovascular: Negative for chest pain, palpitations and leg swelling.  Gastrointestinal: Negative for abdominal distention, abdominal pain, blood in stool, constipation, diarrhea, nausea and vomiting.  Genitourinary: Negative for difficulty urinating and hematuria.  Musculoskeletal: Negative for arthralgias, myalgias and neck pain.  Skin: Negative for rash.  Neurological: Negative for dizziness, seizures, syncope and headaches.  Hematological: Negative for adenopathy. Does not bruise/bleed easily.  Psychiatric/Behavioral: Negative for dysphoric mood. The patient is not nervous/anxious.        Objective:    BP 116/76   Pulse 62   Temp 98 F (36.7 C) (Oral)   Ht 5' 4.5" (1.638 m)   Wt 201 lb 4 oz (91.3 kg)   SpO2 98%   BMI 34.01 kg/m   Wt Readings from Last 3 Encounters:  12/10/16 201 lb 4 oz (91.3 kg)  08/18/16 204  lb 8 oz (92.8 kg)  03/31/16 217 lb 12 oz (98.8 kg)    Physical Exam  Constitutional: He is oriented to person, place, and time. He appears well-developed and well-nourished. No distress.  HENT:  Head: Normocephalic and atraumatic.  Right Ear: Hearing, tympanic membrane, external ear and ear canal normal.  Left Ear: Hearing, tympanic membrane, external ear and ear canal normal.  Nose: Nose normal.  Mouth/Throat: Uvula is midline, oropharynx is clear and moist and mucous membranes are normal. No oropharyngeal exudate, posterior oropharyngeal edema or posterior oropharyngeal  erythema.  Eyes: Conjunctivae and EOM are normal. Pupils are equal, round, and reactive to light. No scleral icterus.  Neck: Normal range of motion. Neck supple. Carotid bruit is not present. No thyromegaly present.  Cardiovascular: Normal rate, regular rhythm, normal heart sounds and intact distal pulses.   No murmur heard. Pulses:      Radial pulses are 2+ on the right side, and 2+ on the left side.  Pulmonary/Chest: Effort normal and breath sounds normal. No respiratory distress. He has no wheezes. He has no rales.  Abdominal: Soft. Bowel sounds are normal. He exhibits no distension and no mass. There is no tenderness. There is no rebound and no guarding.  Genitourinary: Rectum normal and prostate normal. Rectal exam shows no external hemorrhoid, no internal hemorrhoid, no fissure, no mass, no tenderness and anal tone normal. Prostate is not enlarged (20gm) and not tender.  Musculoskeletal: Normal range of motion. He exhibits no edema.  Lymphadenopathy:    He has no cervical adenopathy.  Neurological: He is alert and oriented to person, place, and time.  CN grossly intact, station and gait intact  Skin: Skin is warm and dry. No rash noted.  Psychiatric: He has a normal mood and affect. His behavior is normal. Judgment and thought content normal.  Nursing note and vitals reviewed.  Results for orders placed or performed in visit on 12/03/16  PSA  Result Value Ref Range   PSA 1.36 0.10 - 4.00 ng/mL  Lipid panel  Result Value Ref Range   Cholesterol 112 0 - 200 mg/dL   Triglycerides 65.0 0.0 - 149.0 mg/dL   HDL 39.10 >39.00 mg/dL   VLDL 13.0 0.0 - 40.0 mg/dL   LDL Cholesterol 60 0 - 99 mg/dL   Total CHOL/HDL Ratio 3    NonHDL 48.18   Basic metabolic panel  Result Value Ref Range   Sodium 139 135 - 145 mEq/L   Potassium 4.8 3.5 - 5.1 mEq/L   Chloride 103 96 - 112 mEq/L   CO2 31 19 - 32 mEq/L   Glucose, Bld 96 70 - 99 mg/dL   BUN 21 6 - 23 mg/dL   Creatinine, Ser 0.94 0.40 -  1.50 mg/dL   Calcium 9.5 8.4 - 10.5 mg/dL   GFR 85.75 >60.00 mL/min      Assessment & Plan:   Problem List Items Addressed This Visit    Essential hypertension, benign    Chronic, stable. Will trial lower ACEI dose.       Relevant Medications   atorvastatin (LIPITOR) 40 MG tablet   lisinopril (PRINIVIL,ZESTRIL) 5 MG tablet   Grieving    Lost wife 2016. Ongoing feelings of loss but manageable.       Healthcare maintenance - Primary    Preventative protocols reviewed and updated unless pt declined. Discussed healthy diet and lifestyle.       Hyperlipidemia    chronic, stable. Continue atorvastatin '40mg'$  daily - refilled  today.  ASCVD 10 yr risk = 9.8% reviewed with patient.  Discussed aspirin use, suggested MWF if easy bruising.       Relevant Medications   atorvastatin (LIPITOR) 40 MG tablet   lisinopril (PRINIVIL,ZESTRIL) 5 MG tablet   Insomnia    Controlled with melatonin 5-10mg nightly.       Obesity, Class II, BMI 35-39.9, with comorbidity    Congratulated on ongoing weight loss through healthy diet and lifestyle changes. Motivated to continue.       Sleep apnea    Remote CPAP use. Improved after sinus surgery and weight loss.         Other Visit Diagnoses    Special screening for malignant neoplasms, colon       Relevant Orders   Fecal occult blood, imunochemical       Follow up plan: Return in about 1 year (around 12/10/2017) for annual exam, prior fasting for blood work.  Ria Bush, MD

## 2016-12-10 NOTE — Assessment & Plan Note (Signed)
Remote CPAP use. Improved after sinus surgery and weight loss.

## 2016-12-10 NOTE — Patient Instructions (Addendum)
Pass by lab to pick up stool kit Congratulations on ongoing healthy diet and lifestyle changes! Keep up the good work. Medicines refilled today Return as needed or in 1 year for next physical.  Health Maintenance, Male A healthy lifestyle and preventive care is important for your health and wellness. Ask your health care provider about what schedule of regular examinations is right for you. What should I know about weight and diet?  Eat a Healthy Diet  Eat plenty of vegetables, fruits, whole grains, low-fat dairy products, and lean protein.  Do not eat a lot of foods high in solid fats, added sugars, or salt. Maintain a Healthy Weight  Regular exercise can help you achieve or maintain a healthy weight. You should:  Do at least 150 minutes of exercise each week. The exercise should increase your heart rate and make you sweat (moderate-intensity exercise).  Do strength-training exercises at least twice a week. Watch Your Levels of Cholesterol and Blood Lipids  Have your blood tested for lipids and cholesterol every 5 years starting at 65 years of age. If you are at high risk for heart disease, you should start having your blood tested when you are 65 years old. You may need to have your cholesterol levels checked more often if:  Your lipid or cholesterol levels are high.  You are older than 65 years of age.  You are at high risk for heart disease. What should I know about cancer screening? Many types of cancers can be detected early and may often be prevented. Lung Cancer  You should be screened every year for lung cancer if:  You are a current smoker who has smoked for at least 30 years.  You are a former smoker who has quit within the past 15 years.  Talk to your health care provider about your screening options, when you should start screening, and how often you should be screened. Colorectal Cancer  Routine colorectal cancer screening usually begins at 65 years of age and  should be repeated every 5-10 years until you are 65 years old. You may need to be screened more often if early forms of precancerous polyps or small growths are found. Your health care provider may recommend screening at an earlier age if you have risk factors for colon cancer.  Your health care provider may recommend using home test kits to check for hidden blood in the stool.  A small camera at the end of a tube can be used to examine your colon (sigmoidoscopy or colonoscopy). This checks for the earliest forms of colorectal cancer. Prostate and Testicular Cancer  Depending on your age and overall health, your health care provider may do certain tests to screen for prostate and testicular cancer.  Talk to your health care provider about any symptoms or concerns you have about testicular or prostate cancer. Skin Cancer  Check your skin from head to toe regularly.  Tell your health care provider about any new moles or changes in moles, especially if:  There is a change in a mole's size, shape, or color.  You have a mole that is larger than a pencil eraser.  Always use sunscreen. Apply sunscreen liberally and repeat throughout the day.  Protect yourself by wearing long sleeves, pants, a wide-brimmed hat, and sunglasses when outside. What should I know about heart disease, diabetes, and high blood pressure?  If you are 18-39 years of age, have your blood pressure checked every 3-5 years. If you are 40 years   of age or older, have your blood pressure checked every year. You should have your blood pressure measured twice-once when you are at a hospital or clinic, and once when you are not at a hospital or clinic. Record the average of the two measurements. To check your blood pressure when you are not at a hospital or clinic, you can use:  An automated blood pressure machine at a pharmacy.  A home blood pressure monitor.  Talk to your health care provider about your target blood  pressure.  If you are between 45-79 years old, ask your health care provider if you should take aspirin to prevent heart disease.  Have regular diabetes screenings by checking your fasting blood sugar level.  If you are at a normal weight and have a low risk for diabetes, have this test once every three years after the age of 45.  If you are overweight and have a high risk for diabetes, consider being tested at a younger age or more often.  A one-time screening for abdominal aortic aneurysm (AAA) by ultrasound is recommended for men aged 65-75 years who are current or former smokers. What should I know about preventing infection? Hepatitis B  If you have a higher risk for hepatitis B, you should be screened for this virus. Talk with your health care provider to find out if you are at risk for hepatitis B infection. Hepatitis C  Blood testing is recommended for:  Everyone born from 1945 through 1965.  Anyone with known risk factors for hepatitis C. Sexually Transmitted Diseases (STDs)  You should be screened each year for STDs including gonorrhea and chlamydia if:  You are sexually active and are younger than 65 years of age.  You are older than 65 years of age and your health care provider tells you that you are at risk for this type of infection.  Your sexual activity has changed since you were last screened and you are at an increased risk for chlamydia or gonorrhea. Ask your health care provider if you are at risk.  Talk with your health care provider about whether you are at high risk of being infected with HIV. Your health care provider may recommend a prescription medicine to help prevent HIV infection. What else can I do?  Schedule regular health, dental, and eye exams.  Stay current with your vaccines (immunizations).  Do not use any tobacco products, such as cigarettes, chewing tobacco, and e-cigarettes. If you need help quitting, ask your health care provider.  Limit  alcohol intake to no more than 2 drinks per day. One drink equals 12 ounces of beer, 5 ounces of wine, or 1 ounces of hard liquor.  Do not use street drugs.  Do not share needles.  Ask your health care provider for help if you need support or information about quitting drugs.  Tell your health care provider if you often feel depressed.  Tell your health care provider if you have ever been abused or do not feel safe at home. This information is not intended to replace advice given to you by your health care provider. Make sure you discuss any questions you have with your health care provider. Document Released: 03/11/2008 Document Revised: 05/12/2016 Document Reviewed: 06/17/2015 Elsevier Interactive Patient Education  2017 [employer]  

## 2016-12-10 NOTE — Assessment & Plan Note (Signed)
Preventative protocols reviewed and updated unless pt declined. Discussed healthy diet and lifestyle.  

## 2016-12-10 NOTE — Assessment & Plan Note (Signed)
Chronic, stable. Will trial lower ACEI dose.

## 2016-12-10 NOTE — Assessment & Plan Note (Signed)
chronic, stable. Continue atorvastatin 40mg  daily - refilled today.  ASCVD 10 yr risk = 9.8% reviewed with patient.  Discussed aspirin use, suggested MWF if easy bruising.

## 2016-12-10 NOTE — Assessment & Plan Note (Signed)
Lost wife 2016. Ongoing feelings of loss but manageable.

## 2016-12-20 ENCOUNTER — Other Ambulatory Visit (INDEPENDENT_AMBULATORY_CARE_PROVIDER_SITE_OTHER): Payer: BLUE CROSS/BLUE SHIELD

## 2016-12-20 DIAGNOSIS — Z1211 Encounter for screening for malignant neoplasm of colon: Secondary | ICD-10-CM | POA: Diagnosis not present

## 2016-12-20 LAB — FECAL OCCULT BLOOD, GUAIAC: FECAL OCCULT BLD: NEGATIVE

## 2016-12-20 LAB — FECAL OCCULT BLOOD, IMMUNOCHEMICAL: FECAL OCCULT BLD: NEGATIVE

## 2016-12-21 ENCOUNTER — Encounter: Payer: Self-pay | Admitting: *Deleted

## 2017-06-11 ENCOUNTER — Encounter: Payer: Self-pay | Admitting: Family Medicine

## 2017-06-11 DIAGNOSIS — Z7189 Other specified counseling: Secondary | ICD-10-CM | POA: Insufficient documentation

## 2017-06-14 DIAGNOSIS — M4722 Other spondylosis with radiculopathy, cervical region: Secondary | ICD-10-CM | POA: Diagnosis not present

## 2017-06-14 DIAGNOSIS — M542 Cervicalgia: Secondary | ICD-10-CM | POA: Diagnosis not present

## 2017-06-14 DIAGNOSIS — Z6839 Body mass index (BMI) 39.0-39.9, adult: Secondary | ICD-10-CM | POA: Diagnosis not present

## 2017-06-14 DIAGNOSIS — M5030 Other cervical disc degeneration, unspecified cervical region: Secondary | ICD-10-CM | POA: Diagnosis not present

## 2017-06-14 DIAGNOSIS — I10 Essential (primary) hypertension: Secondary | ICD-10-CM | POA: Diagnosis not present

## 2017-06-14 DIAGNOSIS — M75101 Unspecified rotator cuff tear or rupture of right shoulder, not specified as traumatic: Secondary | ICD-10-CM | POA: Diagnosis not present

## 2017-06-21 DIAGNOSIS — M25511 Pain in right shoulder: Secondary | ICD-10-CM | POA: Diagnosis not present

## 2017-06-21 DIAGNOSIS — M542 Cervicalgia: Secondary | ICD-10-CM | POA: Diagnosis not present

## 2017-06-21 DIAGNOSIS — M4722 Other spondylosis with radiculopathy, cervical region: Secondary | ICD-10-CM | POA: Diagnosis not present

## 2017-06-21 DIAGNOSIS — M75101 Unspecified rotator cuff tear or rupture of right shoulder, not specified as traumatic: Secondary | ICD-10-CM | POA: Diagnosis not present

## 2017-06-27 HISTORY — PX: SHOULDER SURGERY: SHX246

## 2017-06-29 DIAGNOSIS — M75111 Incomplete rotator cuff tear or rupture of right shoulder, not specified as traumatic: Secondary | ICD-10-CM | POA: Diagnosis not present

## 2017-07-08 ENCOUNTER — Encounter: Payer: Self-pay | Admitting: Family Medicine

## 2017-07-08 DIAGNOSIS — M25511 Pain in right shoulder: Secondary | ICD-10-CM | POA: Insufficient documentation

## 2017-07-12 DIAGNOSIS — M75121 Complete rotator cuff tear or rupture of right shoulder, not specified as traumatic: Secondary | ICD-10-CM | POA: Diagnosis not present

## 2017-07-12 DIAGNOSIS — M7541 Impingement syndrome of right shoulder: Secondary | ICD-10-CM | POA: Diagnosis not present

## 2017-07-12 DIAGNOSIS — S43431A Superior glenoid labrum lesion of right shoulder, initial encounter: Secondary | ICD-10-CM | POA: Diagnosis not present

## 2017-07-12 DIAGNOSIS — M24111 Other articular cartilage disorders, right shoulder: Secondary | ICD-10-CM | POA: Diagnosis not present

## 2017-07-12 DIAGNOSIS — G8918 Other acute postprocedural pain: Secondary | ICD-10-CM | POA: Diagnosis not present

## 2017-07-12 DIAGNOSIS — M94211 Chondromalacia, right shoulder: Secondary | ICD-10-CM | POA: Diagnosis not present

## 2017-07-12 DIAGNOSIS — M19011 Primary osteoarthritis, right shoulder: Secondary | ICD-10-CM | POA: Diagnosis not present

## 2017-07-21 DIAGNOSIS — M75111 Incomplete rotator cuff tear or rupture of right shoulder, not specified as traumatic: Secondary | ICD-10-CM | POA: Diagnosis not present

## 2017-08-17 DIAGNOSIS — Z4789 Encounter for other orthopedic aftercare: Secondary | ICD-10-CM | POA: Diagnosis not present

## 2017-08-17 DIAGNOSIS — M75111 Incomplete rotator cuff tear or rupture of right shoulder, not specified as traumatic: Secondary | ICD-10-CM | POA: Diagnosis not present

## 2017-09-02 DIAGNOSIS — M25611 Stiffness of right shoulder, not elsewhere classified: Secondary | ICD-10-CM | POA: Diagnosis not present

## 2017-09-08 DIAGNOSIS — M25611 Stiffness of right shoulder, not elsewhere classified: Secondary | ICD-10-CM | POA: Diagnosis not present

## 2017-09-10 DIAGNOSIS — Z23 Encounter for immunization: Secondary | ICD-10-CM | POA: Diagnosis not present

## 2017-09-12 DIAGNOSIS — M25611 Stiffness of right shoulder, not elsewhere classified: Secondary | ICD-10-CM | POA: Diagnosis not present

## 2017-09-16 DIAGNOSIS — Z4789 Encounter for other orthopedic aftercare: Secondary | ICD-10-CM | POA: Diagnosis not present

## 2017-09-16 DIAGNOSIS — M75111 Incomplete rotator cuff tear or rupture of right shoulder, not specified as traumatic: Secondary | ICD-10-CM | POA: Diagnosis not present

## 2017-09-16 DIAGNOSIS — M25611 Stiffness of right shoulder, not elsewhere classified: Secondary | ICD-10-CM | POA: Diagnosis not present

## 2017-09-22 DIAGNOSIS — M25611 Stiffness of right shoulder, not elsewhere classified: Secondary | ICD-10-CM | POA: Diagnosis not present

## 2017-09-26 DIAGNOSIS — M25611 Stiffness of right shoulder, not elsewhere classified: Secondary | ICD-10-CM | POA: Diagnosis not present

## 2017-09-29 DIAGNOSIS — M25611 Stiffness of right shoulder, not elsewhere classified: Secondary | ICD-10-CM | POA: Diagnosis not present

## 2017-10-03 DIAGNOSIS — M25611 Stiffness of right shoulder, not elsewhere classified: Secondary | ICD-10-CM | POA: Diagnosis not present

## 2017-10-06 DIAGNOSIS — M25611 Stiffness of right shoulder, not elsewhere classified: Secondary | ICD-10-CM | POA: Diagnosis not present

## 2017-10-10 DIAGNOSIS — M25611 Stiffness of right shoulder, not elsewhere classified: Secondary | ICD-10-CM | POA: Diagnosis not present

## 2017-10-13 DIAGNOSIS — M75101 Unspecified rotator cuff tear or rupture of right shoulder, not specified as traumatic: Secondary | ICD-10-CM | POA: Insufficient documentation

## 2017-10-13 DIAGNOSIS — M25611 Stiffness of right shoulder, not elsewhere classified: Secondary | ICD-10-CM | POA: Diagnosis not present

## 2017-10-14 DIAGNOSIS — Z4789 Encounter for other orthopedic aftercare: Secondary | ICD-10-CM | POA: Diagnosis not present

## 2017-10-14 DIAGNOSIS — M75101 Unspecified rotator cuff tear or rupture of right shoulder, not specified as traumatic: Secondary | ICD-10-CM | POA: Diagnosis not present

## 2017-10-27 DIAGNOSIS — R2230 Localized swelling, mass and lump, unspecified upper limb: Secondary | ICD-10-CM | POA: Diagnosis not present

## 2017-10-27 DIAGNOSIS — M19049 Primary osteoarthritis, unspecified hand: Secondary | ICD-10-CM | POA: Diagnosis not present

## 2017-10-27 DIAGNOSIS — M79641 Pain in right hand: Secondary | ICD-10-CM | POA: Diagnosis not present

## 2017-10-27 DIAGNOSIS — M13841 Other specified arthritis, right hand: Secondary | ICD-10-CM | POA: Diagnosis not present

## 2017-11-23 ENCOUNTER — Other Ambulatory Visit: Payer: Self-pay

## 2017-11-23 MED ORDER — LISINOPRIL 5 MG PO TABS: 5.0000 mg | ORAL_TABLET | Freq: Every day | ORAL | 0 refills | Status: DC

## 2017-11-23 MED ORDER — ATORVASTATIN CALCIUM 40 MG PO TABS: 40.0000 mg | ORAL_TABLET | Freq: Every day | ORAL | 0 refills | Status: DC

## 2017-11-23 NOTE — Telephone Encounter (Signed)
Sent refills to [facility] [place] 

## 2017-12-04 ENCOUNTER — Other Ambulatory Visit: Payer: Self-pay | Admitting: Family Medicine

## 2017-12-04 DIAGNOSIS — E785 Hyperlipidemia, unspecified: Secondary | ICD-10-CM

## 2017-12-04 DIAGNOSIS — Z125 Encounter for screening for malignant neoplasm of prostate: Secondary | ICD-10-CM

## 2017-12-04 DIAGNOSIS — Z1159 Encounter for screening for other viral diseases: Secondary | ICD-10-CM

## 2017-12-05 ENCOUNTER — Other Ambulatory Visit (INDEPENDENT_AMBULATORY_CARE_PROVIDER_SITE_OTHER): Payer: Medicare Other

## 2017-12-05 DIAGNOSIS — Z1159 Encounter for screening for other viral diseases: Secondary | ICD-10-CM

## 2017-12-05 DIAGNOSIS — E785 Hyperlipidemia, unspecified: Secondary | ICD-10-CM | POA: Diagnosis not present

## 2017-12-05 DIAGNOSIS — Z125 Encounter for screening for malignant neoplasm of prostate: Secondary | ICD-10-CM | POA: Diagnosis not present

## 2017-12-05 LAB — COMPREHENSIVE METABOLIC PANEL
ALT: 20 U/L (ref 0–53)
AST: 18 U/L (ref 0–37)
Albumin: 3.9 g/dL (ref 3.5–5.2)
Alkaline Phosphatase: 65 U/L (ref 39–117)
BILIRUBIN TOTAL: 0.7 mg/dL (ref 0.2–1.2)
BUN: 23 mg/dL (ref 6–23)
CALCIUM: 9.3 mg/dL (ref 8.4–10.5)
CHLORIDE: 105 meq/L (ref 96–112)
CO2: 30 meq/L (ref 19–32)
Creatinine, Ser: 0.91 mg/dL (ref 0.40–1.50)
GFR: 88.74 mL/min (ref >60.00)
Glucose, Bld: 105 mg/dL — ABNORMAL HIGH (ref 70–99)
Potassium: 4.8 mEq/L (ref 3.5–5.1)
Sodium: 140 mEq/L (ref 135–145)
Total Protein: 6.3 g/dL (ref 6.0–8.3)

## 2017-12-05 LAB — LIPID PANEL
CHOL/HDL RATIO: 3
CHOLESTEROL: 108 mg/dL (ref 0–200)
HDL: 42.1 mg/dL (ref >39.00)
LDL Cholesterol: 56 mg/dL (ref 0–99)
NonHDL: 65.44
TRIGLYCERIDES: 47 mg/dL (ref 0.0–149.0)
VLDL: 9.4 mg/dL (ref 0.0–40.0)

## 2017-12-05 LAB — PSA, MEDICARE: PSA: 1.12 ng/ml (ref 0.10–4.00)

## 2017-12-06 LAB — HEPATITIS C ANTIBODY
Hepatitis C Ab: NONREACTIVE
SIGNAL TO CUT-OFF: 0.01 (ref <1.00)

## 2017-12-09 DIAGNOSIS — H524 Presbyopia: Secondary | ICD-10-CM | POA: Diagnosis not present

## 2017-12-09 DIAGNOSIS — E119 Type 2 diabetes mellitus without complications: Secondary | ICD-10-CM | POA: Diagnosis not present

## 2017-12-09 DIAGNOSIS — H2513 Age-related nuclear cataract, bilateral: Secondary | ICD-10-CM | POA: Diagnosis not present

## 2017-12-09 DIAGNOSIS — E089 Diabetes mellitus due to underlying condition without complications: Secondary | ICD-10-CM | POA: Diagnosis not present

## 2017-12-12 ENCOUNTER — Ambulatory Visit (INDEPENDENT_AMBULATORY_CARE_PROVIDER_SITE_OTHER): Payer: Medicare Other | Admitting: Family Medicine

## 2017-12-12 ENCOUNTER — Encounter: Payer: Self-pay | Admitting: Family Medicine

## 2017-12-12 VITALS — BP 136/70 | HR 62 | Temp 97.8°F | Ht 64.25 in | Wt 236.0 lb

## 2017-12-12 DIAGNOSIS — Z7189 Other specified counseling: Secondary | ICD-10-CM

## 2017-12-12 DIAGNOSIS — G4700 Insomnia, unspecified: Secondary | ICD-10-CM

## 2017-12-12 DIAGNOSIS — Z23 Encounter for immunization: Secondary | ICD-10-CM | POA: Diagnosis not present

## 2017-12-12 DIAGNOSIS — E785 Hyperlipidemia, unspecified: Secondary | ICD-10-CM

## 2017-12-12 DIAGNOSIS — I10 Essential (primary) hypertension: Secondary | ICD-10-CM | POA: Diagnosis not present

## 2017-12-12 DIAGNOSIS — Z0000 Encounter for general adult medical examination without abnormal findings: Secondary | ICD-10-CM | POA: Diagnosis not present

## 2017-12-12 DIAGNOSIS — Z1211 Encounter for screening for malignant neoplasm of colon: Secondary | ICD-10-CM | POA: Diagnosis not present

## 2017-12-12 DIAGNOSIS — Z6841 Body Mass Index (BMI) 40.0 and over, adult: Secondary | ICD-10-CM | POA: Diagnosis not present

## 2017-12-12 MED ORDER — ATORVASTATIN CALCIUM 40 MG PO TABS: 40.0000 mg | ORAL_TABLET | Freq: Every day | ORAL | 3 refills | Status: DC

## 2017-12-12 MED ORDER — LISINOPRIL 10 MG PO TABS: 10.0000 mg | ORAL_TABLET | Freq: Every day | ORAL | 3 refills | Status: DC

## 2017-12-12 MED ORDER — NAPROXEN 500 MG PO TABS: 500.0000 mg | ORAL_TABLET | Freq: Two times a day (BID) | ORAL | 0 refills | Status: DC | PRN

## 2017-12-12 NOTE — Assessment & Plan Note (Signed)
Weight gain noted. Encouraged healthy diet changes

## 2017-12-12 NOTE — Assessment & Plan Note (Signed)
Related to nocturia - discussed limiting fluids at bedtime.

## 2017-12-12 NOTE — Assessment & Plan Note (Signed)
Chronic, stable. Continue current regimen. The ASCVD Risk score (Goff DC Jr., et al., 2013) failed to calculate for the following reasons:   The valid total cholesterol range is 130 to 320 mg/dL  

## 2017-12-12 NOTE — Patient Instructions (Addendum)
Pass by lab to pick up stool kit. Prevnar today If interested, check with pharmacy about new 2 shot shingles series (shingrix).  You are doing well today. Return as needed or in 1 year for next medicare wellness visit with Katha Cabal and follow up with me.   Health Maintenance, Male A healthy lifestyle and preventive care is important for your health and wellness. Ask your health care provider about what schedule of regular examinations is right for you. What should I know about weight and diet? Eat a Healthy Diet  Eat plenty of vegetables, fruits, whole grains, low-fat dairy products, and lean protein.  Do not eat a lot of foods high in solid fats, added sugars, or salt.  Maintain a Healthy Weight Regular exercise can help you achieve or maintain a healthy weight. You should:  Do at least 150 minutes of exercise each week. The exercise should increase your heart rate and make you sweat (moderate-intensity exercise).  Do strength-training exercises at least twice a week.  Watch Your Levels of Cholesterol and Blood Lipids  Have your blood tested for lipids and cholesterol every 5 years starting at 66 years of age. If you are at high risk for heart disease, you should start having your blood tested when you are 66 years old. You may need to have your cholesterol levels checked more often if: ? Your lipid or cholesterol levels are high. ? You are older than 66 years of age. ? You are at high risk for heart disease.  What should I know about cancer screening? Many types of cancers can be detected early and may often be prevented. Lung Cancer  You should be screened every year for lung cancer if: ? You are a current smoker who has smoked for at least 30 years. ? You are a former smoker who has quit within the past 15 years.  Talk to your health care provider about your screening options, when you should start screening, and how often you should be screened.  Colorectal Cancer  Routine  colorectal cancer screening usually begins at 66 years of age and should be repeated every 5-10 years until you are 66 years old. You may need to be screened more often if early forms of precancerous polyps or small growths are found. Your health care provider may recommend screening at an earlier age if you have risk factors for colon cancer.  Your health care provider may recommend using home test kits to check for hidden blood in the stool.  A small camera at the end of a tube can be used to examine your colon (sigmoidoscopy or colonoscopy). This checks for the earliest forms of colorectal cancer.  Prostate and Testicular Cancer  Depending on your age and overall health, your health care provider may do certain tests to screen for prostate and testicular cancer.  Talk to your health care provider about any symptoms or concerns you have about testicular or prostate cancer.  Skin Cancer  Check your skin from head to toe regularly.  Tell your health care provider about any new moles or changes in moles, especially if: ? There is a change in a mole's size, shape, or color. ? You have a mole that is larger than a pencil eraser.  Always use sunscreen. Apply sunscreen liberally and repeat throughout the day.  Protect yourself by wearing long sleeves, pants, a wide-brimmed hat, and sunglasses when outside.  What should I know about heart disease, diabetes, and high blood pressure?  If  you are 33-6 years of age, have your blood pressure checked every 3-5 years. If you are 26 years of age or older, have your blood pressure checked every year. You should have your blood pressure measured twice-once when you are at a hospital or clinic, and once when you are not at a hospital or clinic. Record the average of the two measurements. To check your blood pressure when you are not at a hospital or clinic, you can use: ? An automated blood pressure machine at a pharmacy. ? A home blood pressure  monitor.  Talk to your health care provider about your target blood pressure.  If you are between 14-32 years old, ask your health care provider if you should take aspirin to prevent heart disease.  Have regular diabetes screenings by checking your fasting blood sugar level. ? If you are at a normal weight and have a low risk for diabetes, have this test once every three years after the age of 41. ? If you are overweight and have a high risk for diabetes, consider being tested at a younger age or more often.  A one-time screening for abdominal aortic aneurysm (AAA) by ultrasound is recommended for men aged 49-75 years who are current or former smokers. What should I know about preventing infection? Hepatitis B If you have a higher risk for hepatitis B, you should be screened for this virus. Talk with your health care provider to find out if you are at risk for hepatitis B infection. Hepatitis C Blood testing is recommended for:  Everyone born from [birth year] through 1965.  Anyone with known risk factors for hepatitis C.  Sexually Transmitted Diseases (STDs)  You should be screened each year for STDs including gonorrhea and chlamydia if: ? You are sexually active and are younger than 66 years of age. ? You are older than 66 years of age and your health care provider tells you that you are at risk for this type of infection. ? Your sexual activity has changed since you were last screened and you are at an increased risk for chlamydia or gonorrhea. Ask your health care provider if you are at risk.  Talk with your health care provider about whether you are at high risk of being infected with HIV. Your health care provider may recommend a prescription medicine to help prevent HIV infection.  What else can I do?  Schedule regular health, dental, and eye exams.  Stay current with your vaccines (immunizations).  Do not use any tobacco products, such as cigarettes, chewing tobacco, and  e-cigarettes. If you need help quitting, ask your health care provider.  Limit alcohol intake to no more than 2 drinks per day. One drink equals 12 ounces of beer, 5 ounces of wine, or 1 ounces of hard liquor.  Do not use street drugs.  Do not share needles.  Ask your health care provider for help if you need support or information about quitting drugs.  Tell your health care provider if you often feel depressed.  Tell your health care provider if you have ever been abused or do not feel safe at home. This information is not intended to replace advice given to you by your health care provider. Make sure you discuss any questions you have with your health care provider. Document Released: 03/11/2008 Document Revised: 05/12/2016 Document Reviewed: 06/17/2015 Elsevier Interactive Patient Education  [employer].

## 2017-12-12 NOTE — Assessment & Plan Note (Signed)
Advanced directives received and scanned 05/2017. HCPOA are son Gaynelle AduRobbie then DIL Heather. [facility].

## 2017-12-12 NOTE — Progress Notes (Signed)
BP 136/70 (BP Location: Left Arm, Patient Position: Sitting, Cuff Size: Normal)   Pulse 62   Temp 97.8 F (36.6 C) (Oral)   Ht 5' 4.25" (1.632 m)   Wt 236 lb (107 kg)   SpO2 99%   BMI 40.19 kg/m    CC: AMW Subjective:    Patient ID: Roger Mcdaniel, male    DOB: Oct 01, 1950, 67 y.o.   MRN: [id number]  HPI: Roger Mcdaniel is a 67 y.o. male presenting on 12/12/2017 for Medicare Wellness   R shoulder surgery 06/2017, recovered well ([place] ortho Dr Onnie Graham).  Known R hand osteoarthritis - pending surgery later this year ([facility]). On naprosyn 529m daily and biofreeze for this which has helped.  Some elevated BP readings at home.   Preventative: Colon cancer screening - does yearly stool kit.No h/o blood in stool.  Prostate cancer screening - discussed. Would like to continue. Flu shot - yearly Tdap 2012.  Prevnar today Shingrix - discussed  Advanced directives received and scanned 05/2017. HCPOA are son RHeath Mcdaniel DIL Heather. [facility]long[facility].  Seat belt use discussed.  Sunscreen use discussed. No changing moles on skin. Non smoker Alcohol - none Sees eye doctor regularly  Married. Widower - wife passed 11/2014 Children: 2 out of house Occ: retired, was [place] 3 days a week for 30 min Diet:   Relevant past medical, surgical, family and social history reviewed and updated as indicated. Interim medical history since our last visit reviewed. Allergies and medications reviewed and updated. Outpatient Medications Prior to Visit  Medication Sig Dispense Refill  . aspirin 81 MG tablet Take 81 mg by mouth daily.    . Magnesium 400 MG TABS Take 1 tablet by mouth daily.    . Melatonin 10 MG TABS Take 1 tablet by mouth at bedtime.    .Marland Kitchenzinc gluconate 50 MG tablet Take 50 mg by mouth daily.    .Marland Kitchenatorvastatin (LIPITOR) 40 MG tablet Take 1 tablet (40 mg total) by mouth daily. 30 tablet 0    . lisinopril (PRINIVIL,ZESTRIL) 5 MG tablet Take 1 tablet (5 mg total) by mouth daily. 30 tablet 0  . naproxen (NAPROSYN) 500 MG tablet Take 500 mg by mouth 2 (two) times daily with a meal.    . Magnesium 250 MG TABS Take 1 tablet by mouth daily.    . Naproxen Sod-Diphenhydramine (ALEVE PM) 220-25 MG TABS Take 2 capsules by mouth at bedtime.    . naproxen sodium (ANAPROX) 220 MG tablet Take 440 mg by mouth daily.      No facility-administered medications prior to visit.      Per HPI unless specifically indicated in ROS section below Review of Systems     Objective:    BP 136/70 (BP Location: Left Arm, Patient Position: Sitting, Cuff Size: Normal)   Pulse 62   Temp 97.8 F (36.6 C) (Oral)   Ht 5' 4.25" (1.632 m)   Wt 236 lb (107 kg)   SpO2 99%   BMI 40.19 kg/m   Wt Readings from Last 3 Encounters:  12/12/17 236 lb (107 kg)  12/10/16 201 lb 4 oz (91.3 kg)  08/18/16 204 lb 8 oz (92.8 kg)    Physical Exam  Constitutional: He is oriented to person, place, and time. He appears well-developed and well-nourished. No distress.  HENT:  Head: Normocephalic and atraumatic.  Right Ear: Hearing, tympanic membrane, external ear and  ear canal normal.  Left Ear: Hearing, tympanic membrane, external ear and ear canal normal.  Nose: Nose normal.  Mouth/Throat: Uvula is midline, oropharynx is clear and moist and mucous membranes are normal. No oropharyngeal exudate, posterior oropharyngeal edema or posterior oropharyngeal erythema.  Eyes: Conjunctivae and EOM are normal. Pupils are equal, round, and reactive to light. No scleral icterus.  Neck: Normal range of motion. Neck supple. No thyromegaly present.  Cardiovascular: Normal rate, regular rhythm, normal heart sounds and intact distal pulses.  No murmur heard. Pulses:      Radial pulses are 2+ on the right side, and 2+ on the left side.  Pulmonary/Chest: Effort normal and breath sounds normal. No respiratory distress. He has no wheezes. He  has no rales.  Abdominal: Soft. Bowel sounds are normal. He exhibits no distension and no mass. There is no tenderness. There is no rebound and no guarding.  Genitourinary: Rectum normal and prostate normal. Rectal exam shows no external hemorrhoid, no internal hemorrhoid, no fissure, no mass, no tenderness and anal tone normal. Prostate is not enlarged (20gm) and not tender.  Musculoskeletal: Normal range of motion. He exhibits no edema.  Lymphadenopathy:    He has no cervical adenopathy.  Neurological: He is alert and oriented to person, place, and time.  CN grossly intact, station and gait intact Recall 2/3, 3/3 with cue Calculation 4/5 serial 7s  Skin: Skin is warm and dry. No rash noted.  Psychiatric: He has a normal mood and affect. His behavior is normal. Judgment and thought content normal.  Nursing note and vitals reviewed.  Results for orders placed or performed in visit on 12/05/17  PSA, Medicare  Result Value Ref Range   PSA 1.12 0.10 - 4.00 ng/ml  Comprehensive metabolic panel  Result Value Ref Range   Sodium 140 135 - 145 mEq/L   Potassium 4.8 3.5 - 5.1 mEq/L   Chloride 105 96 - 112 mEq/L   CO2 30 19 - 32 mEq/L   Glucose, Bld 105 (H) 70 - 99 mg/dL   BUN 23 6 - 23 mg/dL   Creatinine, Ser 0.91 0.40 - 1.50 mg/dL   Total Bilirubin 0.7 0.2 - 1.2 mg/dL   Alkaline Phosphatase 65 39 - 117 U/L   AST 18 0 - 37 U/L   ALT 20 0 - 53 U/L   Total Protein 6.3 6.0 - 8.3 g/dL   Albumin 3.9 3.5 - 5.2 g/dL   Calcium 9.3 8.4 - 10.5 mg/dL   GFR 88.74 >60.00 mL/min  Lipid panel  Result Value Ref Range   Cholesterol 108 0 - 200 mg/dL   Triglycerides 47.0 0.0 - 149.0 mg/dL   HDL 42.10 >39.00 mg/dL   VLDL 9.4 0.0 - 40.0 mg/dL   LDL Cholesterol 56 0 - 99 mg/dL   Total CHOL/HDL Ratio 3    NonHDL 65.44   Hepatitis C antibody  Result Value Ref Range   Hepatitis C Ab NON-REACTIVE NON-REACTI   SIGNAL TO CUT-OFF 0.01 <1.00   EKG - NSR rate 60, normal axis, intervals, no acute  ST/Tchanges    Assessment & Plan:   Problem List Items Addressed This Visit    Advanced care planning/counseling discussion    Advanced directives received and scanned 05/2017. HCPOA are son Roger Lark then DIL Heather. Grants discretion to [facility] to make decisions for life-prolonging measures.       BMI 40.0-44.9, adult ([place])    Weight gain noted. Encouraged healthy diet changes  Essential hypertension, benign    Chronic, stable. Continue current regimen. Increase lisinopril to 34m daily as pt endorses home readings 1496Psystolic.       Relevant Medications   atorvastatin (LIPITOR) 40 MG tablet   lisinopril (PRINIVIL,ZESTRIL) 10 MG tablet   Hyperlipidemia    Chronic, stable. Continue current regimen. The ASCVD Risk score (Mikey BussingDC Jr., et al., 2013) failed to calculate for the following reasons:   The valid total cholesterol range is 130 to 320 mg/dL       Relevant Medications   atorvastatin (LIPITOR) 40 MG tablet   lisinopril (PRINIVIL,ZESTRIL) 10 MG tablet   Insomnia    Related to nocturia - discussed limiting fluids at bedtime.       Welcome to Medicare preventive visit - Primary    I have personally reviewed the Medicare Annual Wellness questionnaire and have noted 1. The patient's medical and social history 2. Their use of alcohol, tobacco or illicit drugs 3. Their current medications and supplements 4. The patient's functional ability including ADL's, fall risks, home safety risks and hearing or visual impairment. Cognitive function has been assessed and addressed as indicated.  5. Diet and physical activity 6. Evidence for depression or mood disorders The patients weight, height, BMI have been recorded in the chart. I have made referrals, counseling and provided education to the patient based on review of the above and I have provided the pt with a written personalized care plan for preventive services. Provider list updated.. See scanned questionairre as needed for  further documentation. Reviewed preventative protocols and updated unless pt declined.  Baseline EKG normal.       Relevant Orders   EKG 12-Lead (Completed)    Other Visit Diagnoses    Special screening for malignant neoplasms, colon       Relevant Orders   Fecal occult blood, imunochemical   Need for vaccination with 13-polyvalent pneumococcal conjugate vaccine       Relevant Orders   Pneumococcal conjugate vaccine 13-valent IM (Completed)       Meds ordered this encounter  Medications  . atorvastatin (LIPITOR) 40 MG tablet    Sig: Take 1 tablet (40 mg total) by mouth daily.    Dispense:  90 tablet    Refill:  3  . lisinopril (PRINIVIL,ZESTRIL) 10 MG tablet    Sig: Take 1 tablet (10 mg total) by mouth daily.    Dispense:  90 tablet    Refill:  3  . naproxen (NAPROSYN) 500 MG tablet    Sig: Take 1 tablet (500 mg total) by mouth 2 (two) times daily as needed for moderate pain (take with meals).    Dispense:  180 tablet    Refill:  0   Orders Placed This Encounter  Procedures  . Fecal occult blood, imunochemical    Standing Status:   Future    Standing Expiration Date:   12/13/2018  . Pneumococcal conjugate vaccine 13-valent IM  . EKG 12-Lead    Follow up plan: Return in about 1 year (around 12/13/2018) for medicare wellness visit, follow up visit.  JRia Bush MD

## 2017-12-12 NOTE — Assessment & Plan Note (Signed)
Chronic, stable. Continue current regimen. Increase lisinopril to 10mg  daily as pt endorses home readings 140s systolic.

## 2017-12-12 NOTE — Assessment & Plan Note (Signed)
I have personally reviewed the Medicare Annual Wellness questionnaire and have noted 1. The patient's medical and social history 2. Their use of alcohol, tobacco or illicit drugs 3. Their current medications and supplements 4. The patient's functional ability including ADL's, fall risks, home safety risks and hearing or visual impairment. Cognitive function has been assessed and addressed as indicated.  5. Diet and physical activity 6. Evidence for depression or mood disorders The patients weight, height, BMI have been recorded in the chart. I have made referrals, counseling and provided education to the patient based on review of the above and I have provided the pt with a written personalized care plan for preventive services. Provider list updated.. See scanned questionairre as needed for further documentation. Reviewed preventative protocols and updated unless pt declined.  Baseline EKG normal.

## 2017-12-13 ENCOUNTER — Other Ambulatory Visit: Payer: Self-pay

## 2017-12-13 MED ORDER — ATORVASTATIN CALCIUM 40 MG PO TABS: 40.0000 mg | ORAL_TABLET | Freq: Every day | ORAL | 3 refills | Status: DC

## 2017-12-13 NOTE — Telephone Encounter (Signed)
[facility] [See pt email, 12/12/17.]

## 2017-12-14 NOTE — Telephone Encounter (Signed)
Dr. Reece AgarG, I'm not sure what else to do for this rx. Is there a way to send rx that just shows atorvastatin?

## 2017-12-15 ENCOUNTER — Telehealth: Payer: Self-pay

## 2017-12-15 MED ORDER — ATORVASTATIN CALCIUM 40 MG PO TABS: 40.0000 mg | ORAL_TABLET | Freq: Every day | ORAL | 3 refills | Status: DC

## 2017-12-15 NOTE — Telephone Encounter (Signed)
I've sent in generic atorvastatin - have him call to see if this takes care of the [facility]. Also see if he needs 30d supply sent to local pharmacy. If so, ok to send #30 RF:0

## 2017-12-15 NOTE — Telephone Encounter (Signed)
Spoke with pt relaying message per Dr. Theo DillsG.  Verbalizes understanding and will let us know if any other issues.  Says he does not need 30 day rx.

## 2017-12-15 NOTE — Telephone Encounter (Signed)
Copied from CRM [phone]. Topic: Inquiry >> Dec 15, 2017  7:48 AM Yvonna Alanisobinson, Andra M wrote: Reason for CRM: Patient called requesting to speak with Dr. Timoteo ExposeG's assistant. [facility] not [facility] his Atorvastatin 40MG , but continues to give him the name brand Lipitor. Patient wants the generic because it is free, but the Lipitor is $620.00 for a 90 day supply. Patient needs this taken care of today, because his refill is set to be completed tomorrow.           Thank You!!!

## 2017-12-15 NOTE — Telephone Encounter (Signed)
Spoke with pt about the atorvastatin rx.  Says his insurance no longer covers atorvastatin but rosuvastatin is covered.  Pt request 90 day rx be sent for new med.

## 2017-12-20 ENCOUNTER — Other Ambulatory Visit (INDEPENDENT_AMBULATORY_CARE_PROVIDER_SITE_OTHER): Payer: Medicare Other

## 2017-12-20 DIAGNOSIS — Z1211 Encounter for screening for malignant neoplasm of colon: Secondary | ICD-10-CM

## 2017-12-20 LAB — FECAL OCCULT BLOOD, IMMUNOCHEMICAL: FECAL OCCULT BLD: NEGATIVE

## 2017-12-20 LAB — FECAL OCCULT BLOOD, GUAIAC: FECAL OCCULT BLD: NEGATIVE

## 2017-12-22 ENCOUNTER — Encounter: Payer: Self-pay | Admitting: Family Medicine

## 2018-01-04 LAB — HM DIABETES EYE EXAM

## 2018-01-06 ENCOUNTER — Encounter: Payer: Self-pay | Admitting: Family Medicine

## 2018-04-11 ENCOUNTER — Other Ambulatory Visit: Payer: Self-pay | Admitting: Family Medicine

## 2018-04-11 NOTE — Telephone Encounter (Signed)
Naproxen Last filled:  12/27/17, #180 Last OV (CPE):  12/12/17 Next OV:  none

## 2018-08-20 ENCOUNTER — Other Ambulatory Visit: Payer: Self-pay | Admitting: Family Medicine

## 2018-08-21 NOTE — Telephone Encounter (Signed)
Last office visit 12/12/2017 for Welcome to Medicare.  Last refilled 04/13/2018 for #180 with no refills.  No future appointments.

## 2018-10-09 DIAGNOSIS — M67853 Other specified disorders of tendon, right hip: Secondary | ICD-10-CM | POA: Diagnosis not present

## 2018-10-09 DIAGNOSIS — M25551 Pain in right hip: Secondary | ICD-10-CM | POA: Diagnosis not present

## 2018-10-17 DIAGNOSIS — M7918 Myalgia, other site: Secondary | ICD-10-CM | POA: Diagnosis not present

## 2018-10-20 ENCOUNTER — Encounter: Payer: Self-pay | Admitting: Family Medicine

## 2018-10-24 ENCOUNTER — Encounter: Payer: Self-pay | Admitting: Family Medicine

## 2018-10-24 DIAGNOSIS — I10 Essential (primary) hypertension: Secondary | ICD-10-CM

## 2018-10-24 DIAGNOSIS — M7918 Myalgia, other site: Secondary | ICD-10-CM | POA: Diagnosis not present

## 2018-10-24 NOTE — Telephone Encounter (Signed)
See other mychart message.

## 2018-10-31 DIAGNOSIS — M7918 Myalgia, other site: Secondary | ICD-10-CM | POA: Diagnosis not present

## 2018-11-01 ENCOUNTER — Other Ambulatory Visit (INDEPENDENT_AMBULATORY_CARE_PROVIDER_SITE_OTHER): Payer: Medicare Other

## 2018-11-01 DIAGNOSIS — R2231 Localized swelling, mass and lump, right upper limb: Secondary | ICD-10-CM | POA: Diagnosis not present

## 2018-11-01 DIAGNOSIS — I10 Essential (primary) hypertension: Secondary | ICD-10-CM

## 2018-11-01 DIAGNOSIS — M19041 Primary osteoarthritis, right hand: Secondary | ICD-10-CM | POA: Diagnosis not present

## 2018-11-01 DIAGNOSIS — M13841 Other specified arthritis, right hand: Secondary | ICD-10-CM | POA: Diagnosis not present

## 2018-11-01 DIAGNOSIS — M79641 Pain in right hand: Secondary | ICD-10-CM | POA: Diagnosis not present

## 2018-11-01 LAB — BASIC METABOLIC PANEL
BUN: 19 mg/dL (ref 6–23)
CALCIUM: 9.1 mg/dL (ref 8.4–10.5)
CHLORIDE: 104 meq/L (ref 96–112)
CO2: 27 meq/L (ref 19–32)
Creatinine, Ser: 0.99 mg/dL (ref 0.40–1.50)
GFR: 75.54 mL/min (ref >60.00)
Glucose, Bld: 111 mg/dL — ABNORMAL HIGH (ref 70–99)
Potassium: 4.7 mEq/L (ref 3.5–5.1)
SODIUM: 139 meq/L (ref 135–145)

## 2018-11-02 ENCOUNTER — Encounter: Payer: Self-pay | Admitting: Family Medicine

## 2018-11-12 MED ORDER — LISINOPRIL 20 MG PO TABS: 20.0000 mg | ORAL_TABLET | Freq: Every day | ORAL | 1 refills | Status: DC

## 2018-11-12 NOTE — Addendum Note (Signed)
Addended by: Eustaquio Boyden on: 11/12/2018 10:30 PM   Modules accepted: Orders

## 2018-11-13 DIAGNOSIS — M25561 Pain in right knee: Secondary | ICD-10-CM | POA: Diagnosis not present

## 2018-11-13 DIAGNOSIS — M67853 Other specified disorders of tendon, right hip: Secondary | ICD-10-CM | POA: Diagnosis not present

## 2018-11-13 DIAGNOSIS — M1711 Unilateral primary osteoarthritis, right knee: Secondary | ICD-10-CM | POA: Diagnosis not present

## 2018-11-21 ENCOUNTER — Encounter: Payer: Self-pay | Admitting: Family Medicine

## 2018-11-21 DIAGNOSIS — M1711 Unilateral primary osteoarthritis, right knee: Secondary | ICD-10-CM | POA: Insufficient documentation

## 2018-11-26 HISTORY — PX: WRIST ARTHROSCOPY WITH CARPOMETACARPEL (CMC) ARTHROPLASTY: SHX5680

## 2018-11-27 DIAGNOSIS — E119 Type 2 diabetes mellitus without complications: Secondary | ICD-10-CM | POA: Diagnosis not present

## 2018-11-27 LAB — HM DIABETES EYE EXAM

## 2018-11-28 ENCOUNTER — Encounter: Payer: Self-pay | Admitting: Family Medicine

## 2018-12-01 ENCOUNTER — Other Ambulatory Visit: Payer: Self-pay | Admitting: Family Medicine

## 2018-12-12 DIAGNOSIS — G8918 Other acute postprocedural pain: Secondary | ICD-10-CM | POA: Diagnosis not present

## 2018-12-12 DIAGNOSIS — M13131 Monoarthritis, not elsewhere classified, right wrist: Secondary | ICD-10-CM | POA: Diagnosis not present

## 2018-12-12 DIAGNOSIS — M1811 Unilateral primary osteoarthritis of first carpometacarpal joint, right hand: Secondary | ICD-10-CM | POA: Diagnosis not present

## 2018-12-12 DIAGNOSIS — M19041 Primary osteoarthritis, right hand: Secondary | ICD-10-CM | POA: Diagnosis not present

## 2018-12-19 ENCOUNTER — Other Ambulatory Visit: Payer: Self-pay | Admitting: Family Medicine

## 2018-12-19 ENCOUNTER — Encounter: Payer: Self-pay | Admitting: Family Medicine

## 2018-12-19 DIAGNOSIS — E785 Hyperlipidemia, unspecified: Secondary | ICD-10-CM

## 2018-12-19 DIAGNOSIS — Z8639 Personal history of other endocrine, nutritional and metabolic disease: Secondary | ICD-10-CM

## 2018-12-19 DIAGNOSIS — Z125 Encounter for screening for malignant neoplasm of prostate: Secondary | ICD-10-CM

## 2018-12-20 ENCOUNTER — Other Ambulatory Visit: Payer: Medicare Other

## 2018-12-25 DIAGNOSIS — M19041 Primary osteoarthritis, right hand: Secondary | ICD-10-CM | POA: Diagnosis not present

## 2018-12-25 DIAGNOSIS — R2231 Localized swelling, mass and lump, right upper limb: Secondary | ICD-10-CM | POA: Diagnosis not present

## 2018-12-25 DIAGNOSIS — Z5189 Encounter for other specified aftercare: Secondary | ICD-10-CM | POA: Diagnosis not present

## 2018-12-25 DIAGNOSIS — M13841 Other specified arthritis, right hand: Secondary | ICD-10-CM | POA: Diagnosis not present

## 2018-12-26 ENCOUNTER — Encounter: Payer: Medicare Other | Admitting: Family Medicine

## 2018-12-27 ENCOUNTER — Encounter: Payer: Self-pay | Admitting: Family Medicine

## 2019-01-16 DIAGNOSIS — H2513 Age-related nuclear cataract, bilateral: Secondary | ICD-10-CM | POA: Diagnosis not present

## 2019-01-22 DIAGNOSIS — M13841 Other specified arthritis, right hand: Secondary | ICD-10-CM | POA: Diagnosis not present

## 2019-01-22 DIAGNOSIS — M19041 Primary osteoarthritis, right hand: Secondary | ICD-10-CM | POA: Diagnosis not present

## 2019-01-22 DIAGNOSIS — Z4789 Encounter for other orthopedic aftercare: Secondary | ICD-10-CM | POA: Diagnosis not present

## 2019-01-22 DIAGNOSIS — M79641 Pain in right hand: Secondary | ICD-10-CM | POA: Diagnosis not present

## 2019-02-05 DIAGNOSIS — M25641 Stiffness of right hand, not elsewhere classified: Secondary | ICD-10-CM | POA: Diagnosis not present

## 2019-02-12 DIAGNOSIS — M25641 Stiffness of right hand, not elsewhere classified: Secondary | ICD-10-CM | POA: Diagnosis not present

## 2019-02-16 DIAGNOSIS — Z01818 Encounter for other preprocedural examination: Secondary | ICD-10-CM | POA: Diagnosis not present

## 2019-02-16 DIAGNOSIS — H25811 Combined forms of age-related cataract, right eye: Secondary | ICD-10-CM | POA: Diagnosis not present

## 2019-02-16 DIAGNOSIS — H47323 Drusen of optic disc, bilateral: Secondary | ICD-10-CM | POA: Diagnosis not present

## 2019-02-16 DIAGNOSIS — H25812 Combined forms of age-related cataract, left eye: Secondary | ICD-10-CM | POA: Diagnosis not present

## 2019-02-23 DIAGNOSIS — M25641 Stiffness of right hand, not elsewhere classified: Secondary | ICD-10-CM | POA: Diagnosis not present

## 2019-02-26 DIAGNOSIS — M79641 Pain in right hand: Secondary | ICD-10-CM | POA: Diagnosis not present

## 2019-02-26 DIAGNOSIS — M13841 Other specified arthritis, right hand: Secondary | ICD-10-CM | POA: Diagnosis not present

## 2019-02-26 DIAGNOSIS — Z4789 Encounter for other orthopedic aftercare: Secondary | ICD-10-CM | POA: Diagnosis not present

## 2019-02-26 DIAGNOSIS — M19041 Primary osteoarthritis, right hand: Secondary | ICD-10-CM | POA: Diagnosis not present

## 2019-02-28 DIAGNOSIS — H2511 Age-related nuclear cataract, right eye: Secondary | ICD-10-CM | POA: Diagnosis not present

## 2019-02-28 DIAGNOSIS — H25811 Combined forms of age-related cataract, right eye: Secondary | ICD-10-CM | POA: Diagnosis not present

## 2019-02-28 HISTORY — PX: CATARACT EXTRACTION: SUR2

## 2019-03-05 ENCOUNTER — Other Ambulatory Visit: Payer: Self-pay | Admitting: Family Medicine

## 2019-03-05 NOTE — Telephone Encounter (Signed)
Electronic refill request Naprosyn Last refill 12/02/18 #180 Last office visit 12/12/17 Upcoming appointment 04/06/19

## 2019-03-12 DIAGNOSIS — H25812 Combined forms of age-related cataract, left eye: Secondary | ICD-10-CM | POA: Diagnosis not present

## 2019-03-12 DIAGNOSIS — H2512 Age-related nuclear cataract, left eye: Secondary | ICD-10-CM | POA: Diagnosis not present

## 2019-03-12 HISTORY — PX: CATARACT EXTRACTION: SUR2

## 2019-03-14 DIAGNOSIS — H43811 Vitreous degeneration, right eye: Secondary | ICD-10-CM | POA: Diagnosis not present

## 2019-03-14 DIAGNOSIS — H33321 Round hole, right eye: Secondary | ICD-10-CM | POA: Diagnosis not present

## 2019-03-14 HISTORY — PX: RETINAL LASER PROCEDURE: SHX2339

## 2019-03-19 DIAGNOSIS — M25641 Stiffness of right hand, not elsewhere classified: Secondary | ICD-10-CM | POA: Diagnosis not present

## 2019-04-03 ENCOUNTER — Other Ambulatory Visit (INDEPENDENT_AMBULATORY_CARE_PROVIDER_SITE_OTHER): Payer: Medicare Other

## 2019-04-03 ENCOUNTER — Other Ambulatory Visit: Payer: Self-pay

## 2019-04-03 DIAGNOSIS — Z125 Encounter for screening for malignant neoplasm of prostate: Secondary | ICD-10-CM

## 2019-04-03 DIAGNOSIS — Z8639 Personal history of other endocrine, nutritional and metabolic disease: Secondary | ICD-10-CM

## 2019-04-03 DIAGNOSIS — E785 Hyperlipidemia, unspecified: Secondary | ICD-10-CM

## 2019-04-03 LAB — COMPREHENSIVE METABOLIC PANEL
ALT: 17 U/L (ref 0–53)
AST: 16 U/L (ref 0–37)
Albumin: 4.1 g/dL (ref 3.5–5.2)
Alkaline Phosphatase: 79 U/L (ref 39–117)
BUN: 22 mg/dL (ref 6–23)
CO2: 29 mEq/L (ref 19–32)
Calcium: 8.7 mg/dL (ref 8.4–10.5)
Chloride: 105 mEq/L (ref 96–112)
Creatinine, Ser: 0.87 mg/dL (ref 0.40–1.50)
GFR: 87.58 mL/min (ref >60.00)
Glucose, Bld: 128 mg/dL — ABNORMAL HIGH (ref 70–99)
Potassium: 4.5 mEq/L (ref 3.5–5.1)
Sodium: 141 mEq/L (ref 135–145)
Total Bilirubin: 0.7 mg/dL (ref 0.2–1.2)
Total Protein: 6.3 g/dL (ref 6.0–8.3)

## 2019-04-03 LAB — HEMOGLOBIN A1C: Hgb A1c MFr Bld: 6.5 % (ref 4.6–6.5)

## 2019-04-03 LAB — LIPID PANEL
Cholesterol: 119 mg/dL (ref 0–200)
HDL: 45.2 mg/dL (ref >39.00)
LDL Cholesterol: 61 mg/dL (ref 0–99)
NonHDL: 73.67
Total CHOL/HDL Ratio: 3
Triglycerides: 62 mg/dL (ref 0.0–149.0)
VLDL: 12.4 mg/dL (ref 0.0–40.0)

## 2019-04-03 LAB — PSA, MEDICARE: PSA: 0.95 ng/ml (ref 0.10–4.00)

## 2019-04-06 ENCOUNTER — Telehealth (INDEPENDENT_AMBULATORY_CARE_PROVIDER_SITE_OTHER): Payer: Medicare Other | Admitting: Family Medicine

## 2019-04-06 ENCOUNTER — Encounter: Payer: Self-pay | Admitting: Family Medicine

## 2019-04-06 VITALS — BP 157/84 | HR 68 | Temp 97.1°F | Ht 64.25 in | Wt 240.0 lb

## 2019-04-06 DIAGNOSIS — I10 Essential (primary) hypertension: Secondary | ICD-10-CM

## 2019-04-06 DIAGNOSIS — Z6841 Body Mass Index (BMI) 40.0 and over, adult: Secondary | ICD-10-CM

## 2019-04-06 DIAGNOSIS — E1136 Type 2 diabetes mellitus with diabetic cataract: Secondary | ICD-10-CM | POA: Diagnosis not present

## 2019-04-06 DIAGNOSIS — Z1211 Encounter for screening for malignant neoplasm of colon: Secondary | ICD-10-CM

## 2019-04-06 DIAGNOSIS — M159 Polyosteoarthritis, unspecified: Secondary | ICD-10-CM | POA: Insufficient documentation

## 2019-04-06 DIAGNOSIS — Z0000 Encounter for general adult medical examination without abnormal findings: Secondary | ICD-10-CM | POA: Diagnosis not present

## 2019-04-06 DIAGNOSIS — E785 Hyperlipidemia, unspecified: Secondary | ICD-10-CM

## 2019-04-06 DIAGNOSIS — G4730 Sleep apnea, unspecified: Secondary | ICD-10-CM | POA: Diagnosis not present

## 2019-04-06 MED ORDER — LISINOPRIL 20 MG PO TABS: 20.0000 mg | ORAL_TABLET | Freq: Every day | ORAL | 3 refills | Status: DC

## 2019-04-06 MED ORDER — ATORVASTATIN CALCIUM 40 MG PO TABS: 40.0000 mg | ORAL_TABLET | Freq: Every day | ORAL | 3 refills | Status: DC

## 2019-04-06 MED ORDER — HYDROCHLOROTHIAZIDE 12.5 MG PO CAPS: 12.5000 mg | ORAL_CAPSULE | Freq: Every day | ORAL | 3 refills | Status: DC

## 2019-04-06 MED ORDER — VITAMIN C 500 MG PO TABS: 500.0000 mg | ORAL_TABLET | Freq: Two times a day (BID) | ORAL | Status: DC

## 2019-04-06 NOTE — Assessment & Plan Note (Signed)
Endorses good sleep - no longer on CPAP.

## 2019-04-06 NOTE — Assessment & Plan Note (Signed)
Encouraged healthy diet and lifestyle changes to affect sustainable weight loss. Obesity contributes to multiple medical problems including diabetes, HTN, HLD.

## 2019-04-06 NOTE — Assessment & Plan Note (Signed)

## 2019-04-06 NOTE — Assessment & Plan Note (Signed)
Chronic, stable. Continue current regimen. The ASCVD Risk score (Goff DC Jr., et al., 2013) failed to calculate for the following reasons:   The valid total cholesterol range is 130 to 320 mg/dL  

## 2019-04-06 NOTE — Assessment & Plan Note (Signed)
Diabetes recurrence. Reviewed A1c in detail with patient. Encouraged renewed efforts at diabetic diet and weight loss. Reassess at 6 mo f/u visit. Pt agrees with plan.

## 2019-04-06 NOTE — Progress Notes (Signed)
Virtual visit completed through [facility]. Due to national recommendations of social distancing due to COVID-19, a virtual visit is felt to be most appropriate for this patient at this time. Reviewed limitations of a virtual visit.   Patient location: home Provider location: [place] at [facility], office If any vitals were documented, they were collected by patient at home unless specified below.    BP (!) 157/84 (BP Location: Right Arm, Patient Position: Sitting)   Pulse 68   Temp (!) 97.1 F (36.2 C)   Ht 5' 4.25" (1.632 m)   Wt 240 lb (108.9 kg)   SpO2 97%   BMI 40.88 kg/m    CC: AMW Subjective:    Patient ID: Roger Mcdaniel, male    DOB: 03/20/1951, 68 y.o.   MRN: [id number]  HPI: Roger Mcdaniel is a 68 y.o. male presenting on 04/06/2019 for Medicare Wellness (Reports BS was 117 this morning. )   Did not see Katha Cabal this year.  R hand surgery 11/2018 for severe CMC arthritis. Cataracts removed last month as well. Found to have hole in retina - s/p laser surgery to R eye. Just had   Noticing some ankle swelling in evenings L>R. No calf pain/swelling.  Preventative: Colon cancer screening - does yearly stool kit.No h/o blood in stool.  Prostate cancer screening - discussed. Would like to continue. Flu shot -yearly Tdap 2012.  Prevnar 11/2017, pnuemovax due Shingrix - discussed  Advanced directives received and scanned 05/2017. HCPOA are son Roger Mcdaniel then DIL Roger Mcdaniel. Grants discretion to [facility] to make decisions for life-prolonging measures.  Seat belt use discussed. Sunscreen use discussed. No changing moles on skin. Non smoker Alcohol - a few a week Dentist q6 mo Eye exam yearly - more recently Bowel - no constipation Bladder - no incontinence  Married. 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 Interim medical history since our last visit reviewed. Allergies and medications reviewed and updated. Outpatient Medications Prior to Visit  Medication Sig Dispense Refill  . aspirin 81 MG tablet Take 81 mg by mouth daily.    . Magnesium 400 MG TABS Take 1 tablet by mouth daily.    . Melatonin 10 MG TABS Take 1 tablet by mouth at bedtime.    . naproxen (NAPROSYN) 500 MG tablet TAKE 1 TABLET TWICE A DAY  AS NEEDED FOR MODERATE PAIN(TAKE WITH MEALS) 180 tablet 0  . zinc gluconate 50 MG tablet Take 50 mg by mouth daily.    [name] atorvastatin (LIPITOR) 40 MG tablet TAKE 1 TABLET DAILY 90 tablet 1  . lisinopril (PRINIVIL,ZESTRIL) 20 MG tablet Take 1 tablet (20 mg total) by mouth daily. 90 tablet 1   No facility-administered medications prior to visit.      Per HPI unless specifically indicated in ROS section below Review of Systems Objective:    BP (!) 157/84 (BP Location: Right Arm, Patient Position: Sitting)   Pulse 68   Temp (!) 97.1 F (36.2 C)   Ht 5' 4.25" (1.632 m)   Wt 240 lb (108.9 kg)   SpO2 97%   BMI 40.88 kg/m   Wt Readings from Last 3 Encounters:  04/06/19 240 lb (108.9 kg)  12/12/17 236 lb (107 kg)  12/10/16 201 lb 4 oz (91.3 kg)     Physical exam: Gen: alert, NAD, not ill appearing Pulm: speaks in complete sentences without increased work of breathing Psych: normal mood, normal thought content      Results for orders placed or performed in visit on 04/03/19  PSA, Medicare  Result Value Ref Range   PSA 0.95 0.10 - 4.00 ng/ml  Hemoglobin A1c  Result Value Ref Range   Hgb A1c MFr Bld 6.5 4.6 - 6.5 %  Comprehensive metabolic panel  Result Value Ref Range   Sodium 141 135 - 145 mEq/L   Potassium 4.5 3.5 - 5.1 mEq/L   Chloride 105 96 - 112 mEq/L   CO2 29 19 - 32 mEq/L   Glucose, Bld 128 (H)  70 - 99 mg/dL   BUN 22 6 - 23 mg/dL   Creatinine, Ser 0.87 0.40 - 1.50 mg/dL   Total Bilirubin 0.7 0.2 - 1.2 mg/dL   Alkaline Phosphatase 79 39 - 117 U/L   AST 16 0 - 37 U/L   ALT 17 0 - 53 U/L   Total Protein 6.3 6.0 - 8.3 g/dL   Albumin 4.1 3.5 - 5.2 g/dL   Calcium 8.7 8.4 - 10.5 mg/dL   GFR 87.58 >60.00 mL/min  Lipid panel  Result Value Ref Range   Cholesterol 119 0 - 200 mg/dL   Triglycerides 62.0 0.0 - 149.0 mg/dL   HDL 45.20 >39.00 mg/dL   VLDL 12.4 0.0 - 40.0 mg/dL   LDL Cholesterol 61 0 - 99 mg/dL   Total CHOL/HDL Ratio 3    NonHDL 73.67    Assessment & Plan:   Problem List Items Addressed This Visit    Sleep apnea    Endorses good sleep - no longer on CPAP.       Medicare annual wellness visit, initial - Primary    I have personally reviewed the Medicare Annual Wellness questionnaire and have noted 1. The patient's medical and social history 2. Their use of alcohol, tobacco or illicit drugs 3. Their current medications and supplements 4. The patient's functional ability including ADL's, fall risks, home safety risks and hearing or visual impairment. Cognitive function has been assessed and addressed as indicated.  5. Diet and physical activity 6. Evidence for depression or mood disorders The patients weight, height, BMI have been recorded in the chart. I have made referrals, counseling and provided education to the patient based on review of the above and I have provided the pt with a written personalized care plan for preventive services. Provider list updated.. See scanned questionairre as needed for further documentation. Reviewed preventative protocols and updated unless pt declined.       Hyperlipidemia    Chronic, stable. Continue current regimen. The ASCVD Risk score [name], et al., 2013) failed to calculate for the following reasons:   The valid total cholesterol range is 130 to 320 mg/dL       Relevant Medications   hydrochlorothiazide  (MICROZIDE) 12.5 MG capsule   atorvastatin (LIPITOR) 40 MG tablet   lisinopril (ZESTRIL) 20 MG tablet   Generalized osteoarthritis of hand   Essential hypertension, benign    Elevated today. Will add hctz 12.40m daily. Update with effect in 1 month. Reassess at f/u visit 6 months.       Relevant Medications   hydrochlorothiazide (MICROZIDE) 12.5 MG capsule   atorvastatin (LIPITOR) 40 MG tablet  lisinopril (ZESTRIL) 20 MG tablet   Controlled type 2 diabetes mellitus with cataract ([place])    Diabetes recurrence. Reviewed A1c in detail with patient. Encouraged renewed efforts at diabetic diet and weight loss. Reassess at 6 mo f/u visit. Pt agrees with plan.       Relevant Medications   atorvastatin (LIPITOR) 40 MG tablet   lisinopril (ZESTRIL) 20 MG tablet   BMI 40.0-44.9, adult ([facility])    Encouraged healthy diet and lifestyle changes to affect sustainable weight loss. Obesity contributes to multiple medical problems including diabetes, HTN, HLD.        Other Visit Diagnoses    Special screening for malignant neoplasms, colon       Relevant Orders   Fecal occult blood, imunochemical       Meds ordered this encounter  Medications  . hydrochlorothiazide (MICROZIDE) 12.5 MG capsule    Sig: Take 1 capsule (12.5 mg total) by mouth daily.    Dispense:  90 capsule    Refill:  3  . atorvastatin (LIPITOR) 40 MG tablet    Sig: Take 1 tablet (40 mg total) by mouth daily.    Dispense:  90 tablet    Refill:  3  . lisinopril (ZESTRIL) 20 MG tablet    Sig: Take 1 tablet (20 mg total) by mouth daily.    Dispense:  90 tablet    Refill:  3  . vitamin C (ASCORBIC ACID) 500 MG tablet    Sig: Take 1 tablet (500 mg total) by mouth 2 (two) times daily.   Orders Placed This Encounter  Procedures  . Fecal occult blood, imunochemical    Standing Status:   Future    Standing Expiration Date:   04/05/2020     I discussed the assessment and treatment plan with the patient. The patient was  provided an opportunity to ask questions and all were answered. The patient agreed with the plan and demonstrated an understanding of the instructions. The patient was advised to call back or seek an in-person evaluation if the symptoms worsen or if the condition fails to improve as anticipated.  Follow up plan: Return in about 6 months (around 10/07/2019) for follow up visit.  Ria Bush, MD

## 2019-04-06 NOTE — Assessment & Plan Note (Signed)
Elevated today. Will add hctz 12.5mg  daily. Update with effect in 1 month. Reassess at f/u visit 6 months.

## 2019-04-09 DIAGNOSIS — M13869 Other specified arthritis, unspecified knee: Secondary | ICD-10-CM | POA: Diagnosis not present

## 2019-04-09 DIAGNOSIS — M65311 Trigger thumb, right thumb: Secondary | ICD-10-CM | POA: Diagnosis not present

## 2019-04-09 DIAGNOSIS — M13861 Other specified arthritis, right knee: Secondary | ICD-10-CM | POA: Diagnosis not present

## 2019-04-09 DIAGNOSIS — M13841 Other specified arthritis, right hand: Secondary | ICD-10-CM | POA: Diagnosis not present

## 2019-04-13 DIAGNOSIS — H33321 Round hole, right eye: Secondary | ICD-10-CM | POA: Diagnosis not present

## 2019-04-17 ENCOUNTER — Encounter: Payer: Self-pay | Admitting: Family Medicine

## 2019-04-19 NOTE — Telephone Encounter (Signed)
plz mail patient iFOB.

## 2019-04-19 NOTE — Telephone Encounter (Signed)
Information given to [facility] in [employer] - she states that she will mail the patient the kit.

## 2019-04-23 DIAGNOSIS — M25561 Pain in right knee: Secondary | ICD-10-CM | POA: Diagnosis not present

## 2019-04-23 DIAGNOSIS — M13841 Other specified arthritis, right hand: Secondary | ICD-10-CM | POA: Diagnosis not present

## 2019-04-23 DIAGNOSIS — M79641 Pain in right hand: Secondary | ICD-10-CM | POA: Diagnosis not present

## 2019-05-02 ENCOUNTER — Encounter: Payer: Self-pay | Admitting: Family Medicine

## 2019-05-02 ENCOUNTER — Other Ambulatory Visit (INDEPENDENT_AMBULATORY_CARE_PROVIDER_SITE_OTHER): Payer: Medicare Other

## 2019-05-02 DIAGNOSIS — Z1211 Encounter for screening for malignant neoplasm of colon: Secondary | ICD-10-CM

## 2019-05-02 LAB — FECAL OCCULT BLOOD, GUAIAC: Fecal Occult Blood: NEGATIVE

## 2019-05-02 LAB — FECAL OCCULT BLOOD, IMMUNOCHEMICAL: Fecal Occult Bld: NEGATIVE

## 2019-05-28 ENCOUNTER — Other Ambulatory Visit: Payer: Self-pay | Admitting: Family Medicine

## 2019-05-28 NOTE — Telephone Encounter (Signed)
LOV 04/06/2019 for AWV virtual. Last filled on 03/07/2019 #180 with 0 refill

## 2019-05-29 NOTE — Telephone Encounter (Signed)
To PCP

## 2019-05-30 ENCOUNTER — Other Ambulatory Visit: Payer: Self-pay | Admitting: Family Medicine

## 2019-07-13 ENCOUNTER — Encounter: Payer: Self-pay | Admitting: Family Medicine

## 2019-07-27 DIAGNOSIS — Z23 Encounter for immunization: Secondary | ICD-10-CM | POA: Diagnosis not present

## 2019-08-13 ENCOUNTER — Other Ambulatory Visit: Payer: Self-pay | Admitting: Family Medicine

## 2019-08-14 NOTE — Telephone Encounter (Signed)
Naproxen Last filled:  05/30/19, #180 Last OV:  04/06/19, AWV Next OV:  10/08/19, 6 mo DM f/u.

## 2019-08-22 ENCOUNTER — Other Ambulatory Visit: Payer: Self-pay

## 2019-09-24 ENCOUNTER — Encounter: Payer: Self-pay | Admitting: Family Medicine

## 2019-09-24 NOTE — Telephone Encounter (Signed)
Spoke with pt offering in office visit for HA and elevated BP and BS.  Scheduled on 09/25/19 at 10:15.  Fyi to Dr. Darnell Level.

## 2019-09-25 ENCOUNTER — Encounter: Payer: Self-pay | Admitting: Family Medicine

## 2019-09-25 ENCOUNTER — Ambulatory Visit (INDEPENDENT_AMBULATORY_CARE_PROVIDER_SITE_OTHER): Payer: Medicare Other | Admitting: Family Medicine

## 2019-09-25 ENCOUNTER — Other Ambulatory Visit: Payer: Self-pay

## 2019-09-25 VITALS — BP 160/70 | HR 68 | Temp 97.8°F | Ht 64.25 in | Wt 250.4 lb

## 2019-09-25 DIAGNOSIS — E118 Type 2 diabetes mellitus with unspecified complications: Secondary | ICD-10-CM

## 2019-09-25 DIAGNOSIS — IMO0002 Reserved for concepts with insufficient information to code with codable children: Secondary | ICD-10-CM

## 2019-09-25 DIAGNOSIS — I10 Essential (primary) hypertension: Secondary | ICD-10-CM | POA: Diagnosis not present

## 2019-09-25 DIAGNOSIS — M1711 Unilateral primary osteoarthritis, right knee: Secondary | ICD-10-CM

## 2019-09-25 DIAGNOSIS — Z6841 Body Mass Index (BMI) 40.0 and over, adult: Secondary | ICD-10-CM | POA: Diagnosis not present

## 2019-09-25 DIAGNOSIS — E1165 Type 2 diabetes mellitus with hyperglycemia: Secondary | ICD-10-CM | POA: Diagnosis not present

## 2019-09-25 LAB — POCT GLYCOSYLATED HEMOGLOBIN (HGB A1C): Hemoglobin A1C: 9.1 % — AB (ref 4.0–5.6)

## 2019-09-25 MED ORDER — METFORMIN HCL 500 MG PO TABS: 500.0000 mg | ORAL_TABLET | Freq: Two times a day (BID) | ORAL | 3 refills | Status: DC

## 2019-09-25 MED ORDER — GLIPIZIDE 5 MG PO TABS: 5.0000 mg | ORAL_TABLET | Freq: Every day | ORAL | 1 refills | Status: DC

## 2019-09-25 MED ORDER — CVS GLUCOSE METER TEST STRIPS VI STRP: ORAL_STRIP | 3 refills | Status: DC

## 2019-09-25 MED ORDER — LISINOPRIL-HYDROCHLOROTHIAZIDE 20-25 MG PO TABS: 1.0000 | ORAL_TABLET | Freq: Every day | ORAL | 3 refills | Status: DC

## 2019-09-25 NOTE — Assessment & Plan Note (Addendum)
End stage tricompartmental osteoarthritis. Wants to postpone surgery.

## 2019-09-25 NOTE — Assessment & Plan Note (Signed)
Chronic, deteriorated. Will increase hctz to 25mg  and start combo pill. Reassess at 2 wk f/u visit.

## 2019-09-25 NOTE — Assessment & Plan Note (Signed)
Chronic, deteriorated with weight gain (previously resolved with 44 lb weight loss). Will restart metformin 500mg  once daily x 1 wk then increase to BID with meals. Will add glipizide 5mg  daily with breakfast, with hypoglycemia precautions. Reassess at 2 wk f/u.

## 2019-09-25 NOTE — Progress Notes (Signed)
This visit was conducted in person.  BP (!) 160/70 (BP Location: Right Arm, Cuff Size: Large)   Pulse 68   Temp 97.8 F (36.6 C) (Temporal)   Ht 5' 4.25" (1.632 m)   Wt 250 lb 6 oz (113.6 kg)   SpO2 98%   BMI 42.64 kg/m    CC: HA, elevated BP and cbg readings recently Subjective:    Patient ID: Roger Mcdaniel, male    DOB: 1950/10/15, 68 y.o.   MRN: [id number]  HPI: Roger PolingBobby J [name] is a 68 y.o. male presenting on 09/25/2019 for Headache (C/o HA, recent elevated BP and BS.  )   Weight gain noted over the past year.   HTN - Compliant with current antihypertensive regimen of lisinopril 20mg  daily and hctz 12.5mg  daily. Does check blood pressures at home: 140-150/70s. No low blood pressure readings or symptoms of dizziness/syncope. Denies vision changes, CP/tightness, SOB, leg swelling. Noticing headaches recently. No cough.   Known knee osteoarthritis. Postponing surgery as much as able. Increasing urinary urgency.   DM - overall diet controlled previously, but worse the past few days. Checking sugars twice daily last few days - fasting 160-190.   Noticing increasing headaches and body aches.   He works at [employer] with negative covid tests. Bilateral cataract surgery 02/2019.      Relevant past medical, surgical, family and social history reviewed and updated as indicated. Interim medical history since our last visit reviewed. Allergies and medications reviewed and updated. Outpatient Medications Prior to Visit  Medication Sig Dispense Refill  . aspirin 81 MG tablet Take 81 mg by mouth daily.    [name]. atorvastatin (LIPITOR) 40 MG tablet Take 1 tablet (40 mg total) by mouth daily. 90 tablet 3  . Magnesium 400 MG TABS Take 1 tablet by mouth daily.    . Melatonin 10 MG TABS Take 1 tablet by mouth at bedtime.    . naproxen (NAPROSYN) 500 MG tablet TAKE 1 TABLET TWICE A DAY  AS NEEDED FOR MODERATE PAIN(TAKE WITH MEALS) 180 tablet 0  . vitamin C (ASCORBIC ACID) 500 MG tablet Take  1 tablet (500 mg total) by mouth 2 (two) times daily.    [name]. zinc gluconate 50 MG tablet Take 50 mg by mouth daily.    . hydrochlorothiazide (MICROZIDE) 12.5 MG capsule Take 1 capsule (12.5 mg total) by mouth daily. 90 capsule 3  . lisinopril (ZESTRIL) 20 MG tablet Take 1 tablet (20 mg total) by mouth daily. 90 tablet 3   No facility-administered medications prior to visit.     Per HPI unless specifically indicated in ROS section below Review of Systems Objective:    BP (!) 160/70 (BP Location: Right Arm, Cuff Size: Large)   Pulse 68   Temp 97.8 F (36.6 C) (Temporal)   Ht 5' 4.25" (1.632 m)   Wt 250 lb 6 oz (113.6 kg)   SpO2 98%   BMI 42.64 kg/m   Wt Readings from Last 3 Encounters:  09/25/19 250 lb 6 oz (113.6 kg)  04/06/19 240 lb (108.9 kg)  12/12/17 236 lb (107 kg)    Physical Exam Vitals and nursing note reviewed.  Constitutional:      General: He is not in acute distress.    Appearance: Normal appearance. He is well-developed. He is obese. He is not ill-appearing.  HENT:     Head: Normocephalic and atraumatic.  Eyes:     General: No scleral icterus.    Extraocular Movements: Extraocular  movements intact.     Conjunctiva/sclera: Conjunctivae normal.     Pupils: Pupils are equal, round, and reactive to light.  Cardiovascular:     Rate and Rhythm: Normal rate and regular rhythm.     Pulses: Normal pulses.     Heart sounds: Normal heart sounds. No murmur.  Pulmonary:     Effort: Pulmonary effort is normal. No respiratory distress.     Breath sounds: Normal breath sounds. No wheezing, rhonchi or rales.  Musculoskeletal:     Comments: See HPI for foot exam if done  Skin:    General: Skin is warm and dry.     Findings: No rash.  Neurological:     Mental Status: He is alert.  Psychiatric:        Mood and Affect: Mood normal.        Behavior: Behavior normal.       Lab Results  Component Value Date   HGBA1C 9.1 (A) 09/25/2019    Assessment & Plan:  This visit  occurred during the SARS-CoV-2 public health emergency.  Safety protocols were in place, including screening questions prior to the visit, additional usage of staff PPE, and extensive cleaning of exam room while observing appropriate contact time as indicated for disinfecting solutions.   Problem List Items Addressed This Visit    Uncontrolled diabetes mellitus with complications (HCC) - Primary    Chronic, deteriorated with weight gain (previously resolved with 44 lb weight loss). Will restart metformin 500mg  once daily x 1 wk then increase to BID with meals. Will add glipizide 5mg  daily with breakfast, with hypoglycemia precautions. Reassess at 2 wk f/u.       Relevant Medications   metFORMIN (GLUCOPHAGE) 500 MG tablet   lisinopril-hydrochlorothiazide (ZESTORETIC) 20-25 MG tablet   glipiZIDE (GLUCOTROL) 5 MG tablet   Other Relevant Orders   POCT glycosylated hemoglobin (Hb A1C) (Completed)   Primary osteoarthritis of right knee    End stage tricompartmental osteoarthritis. Wants to postpone surgery.       Essential hypertension, benign    Chronic, deteriorated. Will increase hctz to 25mg  and start combo pill. Reassess at 2 wk f/u visit.       Relevant Medications   lisinopril-hydrochlorothiazide (ZESTORETIC) 20-25 MG tablet   BMI 40.0-44.9, adult (HCC)    Discussed recent weight gain noted (10 lbs in last 6 months). Reviewed relation of weight gain to insulin resistance and elevated blood pressures.       Relevant Medications   metFORMIN (GLUCOPHAGE) 500 MG tablet   glipiZIDE (GLUCOTROL) 5 MG tablet       Meds ordered this encounter  Medications  . metFORMIN (GLUCOPHAGE) 500 MG tablet    Sig: Take 1 tablet (500 mg total) by mouth 2 (two) times daily with a meal. Take 1 tab daily for the first week    Dispense:  180 tablet    Refill:  3  . lisinopril-hydrochlorothiazide (ZESTORETIC) 20-25 MG tablet    Sig: Take 1 tablet by mouth daily.    Dispense:  90 tablet    Refill:  3      In place of individual components  . glipiZIDE (GLUCOTROL) 5 MG tablet    Sig: Take 1 tablet (5 mg total) by mouth daily before breakfast.    Dispense:  30 tablet    Refill:  1  . glucose blood ([facility] GLUCOSE METER TEST STRIPS) test strip    Sig: Use as instructed to check sugars twice daily E11.8  Dispense:  200 each    Refill:  3    Advanced glucometer   Orders Placed This Encounter  Procedures  . POCT glycosylated hemoglobin (Hb A1C)    Patient Instructions  POC A1c today.  Increase blood pressure medicine to combo pill lisinopril hydrochlorothiazide 20/25mg  daily - to replace individual components.  Start metformin 500mg  daily with meal for 1 week then increase to 500mg  twice daily with meals.  Add glipizide 5mg  daily with breakfast for the first month.  Keep follow up appointment.  Continue low sugar low carb low salt diet.    Follow up plan: No follow-ups on file.  Ria Bush, MD

## 2019-09-25 NOTE — Patient Instructions (Addendum)
POC A1c today.  Increase blood pressure medicine to combo pill lisinopril hydrochlorothiazide 20/25mg  daily - to replace individual components.  Start metformin 500mg  daily with meal for 1 week then increase to 500mg  twice daily with meals.  Add glipizide 5mg  daily with breakfast for the first month.  Keep follow up appointment.  Continue low sugar low carb low salt diet.

## 2019-09-25 NOTE — Assessment & Plan Note (Signed)
Discussed recent weight gain noted (10 lbs in last 6 months). Reviewed relation of weight gain to insulin resistance and elevated blood pressures.

## 2019-09-25 NOTE — Telephone Encounter (Signed)
Seen today. 

## 2019-09-26 MED ORDER — ONETOUCH ULTRASOFT LANCETS MISC: 12 refills | Status: DC

## 2019-09-26 MED ORDER — GLUCOSE BLOOD VI STRP: ORAL_STRIP | 12 refills | Status: DC

## 2019-09-26 MED ORDER — BLOOD GLUCOSE METER KIT: PACK | 0 refills | Status: DC

## 2019-09-26 NOTE — Telephone Encounter (Signed)
Received fax from pharmacy and it states directions for testing needs to be once daily only. And to send One touch meter, lancet and test strips.  Rxs for all that was requested has been sent to the pharmacy. Dr. Darnell Level let me know if anything needs to be done differently. Thank you

## 2019-09-26 NOTE — Telephone Encounter (Signed)
Thanks

## 2019-10-05 ENCOUNTER — Other Ambulatory Visit: Payer: Self-pay

## 2019-10-05 ENCOUNTER — Other Ambulatory Visit (INDEPENDENT_AMBULATORY_CARE_PROVIDER_SITE_OTHER): Payer: Medicare Other

## 2019-10-05 ENCOUNTER — Other Ambulatory Visit: Payer: Self-pay | Admitting: Family Medicine

## 2019-10-05 DIAGNOSIS — E118 Type 2 diabetes mellitus with unspecified complications: Secondary | ICD-10-CM | POA: Diagnosis not present

## 2019-10-05 DIAGNOSIS — E1165 Type 2 diabetes mellitus with hyperglycemia: Secondary | ICD-10-CM

## 2019-10-05 DIAGNOSIS — E785 Hyperlipidemia, unspecified: Secondary | ICD-10-CM | POA: Diagnosis not present

## 2019-10-05 DIAGNOSIS — E1169 Type 2 diabetes mellitus with other specified complication: Secondary | ICD-10-CM | POA: Diagnosis not present

## 2019-10-05 DIAGNOSIS — IMO0002 Reserved for concepts with insufficient information to code with codable children: Secondary | ICD-10-CM

## 2019-10-05 LAB — LIPID PANEL
Cholesterol: 94 mg/dL (ref 0–200)
HDL: 37.3 mg/dL — ABNORMAL LOW (ref >39.00)
LDL Cholesterol: 43 mg/dL (ref 0–99)
NonHDL: 57.16
Total CHOL/HDL Ratio: 3
Triglycerides: 72 mg/dL (ref 0.0–149.0)
VLDL: 14.4 mg/dL (ref 0.0–40.0)

## 2019-10-05 LAB — BASIC METABOLIC PANEL
BUN: 20 mg/dL (ref 6–23)
CO2: 30 mEq/L (ref 19–32)
Calcium: 9.7 mg/dL (ref 8.4–10.5)
Chloride: 101 mEq/L (ref 96–112)
Creatinine, Ser: 0.92 mg/dL (ref 0.40–1.50)
GFR: 81.98 mL/min (ref >60.00)
Glucose, Bld: 124 mg/dL — ABNORMAL HIGH (ref 70–99)
Potassium: 4.8 mEq/L (ref 3.5–5.1)
Sodium: 138 mEq/L (ref 135–145)

## 2019-10-08 ENCOUNTER — Ambulatory Visit: Payer: Medicare Other | Admitting: Family Medicine

## 2019-10-12 ENCOUNTER — Other Ambulatory Visit: Payer: Self-pay

## 2019-10-12 ENCOUNTER — Ambulatory Visit (INDEPENDENT_AMBULATORY_CARE_PROVIDER_SITE_OTHER): Payer: Medicare Other | Admitting: Family Medicine

## 2019-10-12 ENCOUNTER — Encounter: Payer: Self-pay | Admitting: Family Medicine

## 2019-10-12 VITALS — BP 130/72 | HR 63 | Temp 97.6°F | Ht 64.25 in | Wt 243.0 lb

## 2019-10-12 DIAGNOSIS — E118 Type 2 diabetes mellitus with unspecified complications: Secondary | ICD-10-CM | POA: Diagnosis not present

## 2019-10-12 DIAGNOSIS — E1165 Type 2 diabetes mellitus with hyperglycemia: Secondary | ICD-10-CM

## 2019-10-12 DIAGNOSIS — I10 Essential (primary) hypertension: Secondary | ICD-10-CM | POA: Diagnosis not present

## 2019-10-12 DIAGNOSIS — Z23 Encounter for immunization: Secondary | ICD-10-CM | POA: Diagnosis not present

## 2019-10-12 DIAGNOSIS — E1169 Type 2 diabetes mellitus with other specified complication: Secondary | ICD-10-CM | POA: Diagnosis not present

## 2019-10-12 DIAGNOSIS — E785 Hyperlipidemia, unspecified: Secondary | ICD-10-CM

## 2019-10-12 DIAGNOSIS — IMO0002 Reserved for concepts with insufficient information to code with codable children: Secondary | ICD-10-CM

## 2019-10-12 DIAGNOSIS — Z6841 Body Mass Index (BMI) 40.0 and over, adult: Secondary | ICD-10-CM

## 2019-10-12 NOTE — Assessment & Plan Note (Signed)
Chronic, stable. Continue lipitor.  The ASCVD Risk score (Goff DC Jr., et al., 2013) failed to calculate for the following reasons:   The valid total cholesterol range is 130 to 320 mg/dL  

## 2019-10-12 NOTE — Assessment & Plan Note (Signed)
Chronic, improved on higher ACEI/HCTZ dose. Continue.

## 2019-10-12 NOTE — Assessment & Plan Note (Addendum)
Congratulated on weight loss to date. Motivated for ongoing healthy diet choices.

## 2019-10-12 NOTE — Progress Notes (Signed)
This visit was conducted in person.  BP 130/72 (BP Location: Left Arm, Patient Position: Sitting, Cuff Size: Large)   Pulse 63   Temp 97.6 F (36.4 C) (Temporal)   Ht 5' 4.25" (1.632 m)   Wt 243 lb (110.2 kg)   SpO2 98%   BMI 41.39 kg/m    CC: DM f/u visit Subjective:    Patient ID: Roger Mcdaniel, male    DOB: Nov 10, 1950, 69 y.o.   MRN: [id number]  HPI: Roger Mcdaniel is a 69 y.o. male presenting on 10/12/2019 for Diabetes (Here for 6 mo f/u.)   HTN - doing better with increase in lisinopril/hctz dose.   DM - does regularly check sugars twice daily - fasting 110-130s, PM low 100s. Compliant with antihyperglycemic regimen which includes: diet controlled - just started metformin 567m bid with glipizide 517mwith breakfast x 1 month. 7lb weight loss. Denies low sugars or hypoglycemic symptoms. Denies paresthesias. Last diabetic eye exam 11/2018. Pneumovax: DUE. Prevnar: 11/2017. Glucometer brand: one-touch. DSME: remotely. Lab Results  Component Value Date   HGBA1C 9.1 (A) 09/25/2019   Diabetic Foot Exam - Simple   Simple Foot Form Diabetic Foot exam was performed with the following findings: Yes 10/12/2019  8:26 AM  Visual Inspection No deformities, no ulcerations, no other skin breakdown bilaterally: Yes Sensation Testing Intact to touch and monofilament testing bilaterally: Yes Pulse Check Posterior Tibialis and Dorsalis pulse intact bilaterally: Yes Comments    Lab Results  Component Value Date   MICROALBUR 4.5 (H) 04/27/2011       Relevant past medical, surgical, family and social history reviewed and updated as indicated. Interim medical history since our last visit reviewed. Allergies and medications reviewed and updated. Outpatient Medications Prior to Visit  Medication Sig Dispense Refill  . aspirin 81 MG tablet Take 81 mg by mouth daily.    . Marland Kitchentorvastatin (LIPITOR) 40 MG tablet Take 1 tablet (40 mg total) by mouth daily. 90 tablet 3  . blood glucose meter kit  and supplies Fill One touch meter that is covered. Check sugar once daily. Dx E11.9 1 each 0  . glipiZIDE (GLUCOTROL) 5 MG tablet Take 1 tablet (5 mg total) by mouth daily before breakfast. 30 tablet 1  . glucose blood test strip Check sugar once daily Dx E111.9 100 each 12  . Lancets (ONETOUCH ULTRASOFT) lancets Check sugar once daily Dx E11.9 100 each 12  . lisinopril-hydrochlorothiazide (ZESTORETIC) 20-25 MG tablet Take 1 tablet by mouth daily. 90 tablet 3  . Magnesium 400 MG TABS Take 1 tablet by mouth daily.    . Melatonin 10 MG TABS Take 1 tablet by mouth at bedtime.    . metFORMIN (GLUCOPHAGE) 500 MG tablet Take 1 tablet (500 mg total) by mouth 2 (two) times daily with a meal. Take 1 tab daily for the first week 180 tablet 3  . naproxen (NAPROSYN) 500 MG tablet TAKE 1 TABLET TWICE A DAY  AS NEEDED FOR MODERATE PAIN(TAKE WITH MEALS) 180 tablet 0  . vitamin C (ASCORBIC ACID) 500 MG tablet Take 1 tablet (500 mg total) by mouth 2 (two) times daily.    . Marland Kitcheninc gluconate 50 MG tablet Take 50 mg by mouth daily.     No facility-administered medications prior to visit.     Per HPI unless specifically indicated in ROS section below Review of Systems Objective:    BP 130/72 (BP Location: Left Arm, Patient Position: Sitting, Cuff Size: Large)   Pulse 63  Temp 97.6 F (36.4 C) (Temporal)   Ht 5' 4.25" (1.632 m)   Wt 243 lb (110.2 kg)   SpO2 98%   BMI 41.39 kg/m   Wt Readings from Last 3 Encounters:  10/12/19 243 lb (110.2 kg)  09/25/19 250 lb 6 oz (113.6 kg)  04/06/19 240 lb (108.9 kg)    Physical Exam Vitals and nursing note reviewed.  Constitutional:      General: He is not in acute distress.    Appearance: Normal appearance. He is well-developed. He is obese. He is not ill-appearing.  HENT:     Head: Normocephalic and atraumatic.  Eyes:     General: No scleral icterus.    Conjunctiva/sclera: Conjunctivae normal.     Pupils: Pupils are equal, round, and reactive to light.    Cardiovascular:     Rate and Rhythm: Normal rate and regular rhythm.     Pulses: Normal pulses.     Heart sounds: Normal heart sounds. No murmur.  Pulmonary:     Effort: Pulmonary effort is normal. No respiratory distress.     Breath sounds: Normal breath sounds. No wheezing, rhonchi or rales.  Musculoskeletal:     Cervical back: Normal range of motion and neck supple.     Right lower leg: Edema (tr) present.     Left lower leg: Edema (1+) present.     Comments: See HPI for foot exam if done  Lymphadenopathy:     Cervical: No cervical adenopathy.  Skin:    General: Skin is warm and dry.     Findings: No rash.       Results for orders placed or performed in visit on 04/88/89  Basic metabolic panel  Result Value Ref Range   Sodium 138 135 - 145 mEq/L   Potassium 4.8 3.5 - 5.1 mEq/L   Chloride 101 96 - 112 mEq/L   CO2 30 19 - 32 mEq/L   Glucose, Bld 124 (H) 70 - 99 mg/dL   BUN 20 6 - 23 mg/dL   Creatinine, Ser 0.92 0.40 - 1.50 mg/dL   GFR 81.98 >60.00 mL/min   Calcium 9.7 8.4 - 10.5 mg/dL  Lipid panel  Result Value Ref Range   Cholesterol 94 0 - 200 mg/dL   Triglycerides 72.0 0.0 - 149.0 mg/dL   HDL 37.30 (L) >39.00 mg/dL   VLDL 14.4 0.0 - 40.0 mg/dL   LDL Cholesterol 43 0 - 99 mg/dL   Total CHOL/HDL Ratio 3    NonHDL 57.16    Assessment & Plan:  This visit occurred during the SARS-CoV-2 public health emergency.  Safety protocols were in place, including screening questions prior to the visit, additional usage of staff PPE, and extensive cleaning of exam room while observing appropriate contact time as indicated for disinfecting solutions.   Problem List Items Addressed This Visit    Uncontrolled diabetes mellitus with complications ([place]) - Primary    Chronic, significant improvement on current regimen based on cbg log he brings - continue metformin 578m bid, consider tapering off glipizide if able to. Pt agrees with plan.       Hyperlipidemia associated with type 2  diabetes mellitus (HCC)    Chronic, stable. Continue lipitor. The ASCVD Risk score (Mikey BussingDC Jr., et al., 2013) failed to calculate for the following reasons:   The valid total cholesterol range is 130 to 320 mg/dL       Essential hypertension, benign    Chronic, improved on higher ACEI/HCTZ dose. Continue.  BMI 40.0-44.9, adult ([place])    Congratulated on weight loss to date. Motivated for ongoing healthy diet choices.           No orders of the defined types were placed in this encounter.  No orders of the defined types were placed in this encounter.   Patient Instructions  Pneumovax today.  You are doing well today - continue current regimen.  May be able to stop glipizide depending on sugar control over the next few weeks.  Return as needed or in 6 months for wellness visit   Follow up plan: Return in about 6 months (around 04/10/2020) for medicare wellness visit.  Ria Bush, MD

## 2019-10-12 NOTE — Assessment & Plan Note (Addendum)
Chronic, significant improvement on current regimen based on cbg log he brings - continue metformin 500mg  bid, consider tapering off glipizide if able to. Pt agrees with plan.

## 2019-10-12 NOTE — Addendum Note (Signed)
Addended by: Nanci Pina on: 10/12/2019 09:00 AM   Modules accepted: Orders

## 2019-10-12 NOTE — Patient Instructions (Addendum)
Pneumovax today.  You are doing well today - continue current regimen.  May be able to stop glipizide depending on sugar control over the next few weeks.  Return as needed or in 6 months for wellness visit

## 2019-10-13 ENCOUNTER — Encounter: Payer: Self-pay | Admitting: Family Medicine

## 2019-10-13 LAB — FRUCTOSAMINE: Fructosamine: 293 umol/L — ABNORMAL HIGH (ref 205–285)

## 2019-10-13 NOTE — Telephone Encounter (Signed)
Replied via lab results.  

## 2019-10-16 DIAGNOSIS — Z20828 Contact with and (suspected) exposure to other viral communicable diseases: Secondary | ICD-10-CM | POA: Diagnosis not present

## 2019-11-14 ENCOUNTER — Other Ambulatory Visit: Payer: Self-pay | Admitting: Family Medicine

## 2019-11-14 ENCOUNTER — Encounter: Payer: Self-pay | Admitting: Family Medicine

## 2019-11-14 NOTE — Telephone Encounter (Signed)
Naproxen Last filled:  08/19/19, #180 Last OV:  10/12/19, f/u Next OV:  04/11/20, AWV prt 2

## 2019-12-06 ENCOUNTER — Encounter: Payer: Self-pay | Admitting: Family Medicine

## 2019-12-07 MED ORDER — METFORMIN HCL 500 MG PO TABS: 500.0000 mg | ORAL_TABLET | Freq: Two times a day (BID) | ORAL | 2 refills | Status: DC

## 2019-12-07 NOTE — Telephone Encounter (Signed)
E-scribed new rx to [facility].

## 2019-12-09 ENCOUNTER — Other Ambulatory Visit: Payer: Self-pay | Admitting: Family Medicine

## 2019-12-11 NOTE — Telephone Encounter (Signed)
Spoke with pt asking about sulfa allergy.  Pt states he does not have sulfa allergy but his wife did.  Confirms he still takes atorvastatin.   Informed pharmacy pt still takes med.

## 2020-02-17 ENCOUNTER — Other Ambulatory Visit: Payer: Self-pay | Admitting: Family Medicine

## 2020-02-19 NOTE — Telephone Encounter (Signed)
Last office visit 10/12/2019 for DM.  Last refilled 11/16/2019 for #180 with no refills.  CPE scheduled for 04/11/2020.

## 2020-02-27 ENCOUNTER — Encounter: Payer: Self-pay | Admitting: Family Medicine

## 2020-02-27 NOTE — Telephone Encounter (Signed)
Can he come in Thursday at 12:45pm?  If not please schedule for Friday with me at open slot. Thanks

## 2020-02-28 NOTE — Telephone Encounter (Signed)
Please offer Friday appt with me if desired.

## 2020-02-28 NOTE — Telephone Encounter (Signed)
Spoke with pt to see if OV today at 12:45 is feasible.  Declines due to working.  However, states he has OV with Dr. Para March tomorrow at 9:00.    Is this ok or do you want me to still get him added to your schedule tomorrow?

## 2020-02-28 NOTE — Telephone Encounter (Signed)
Noted pt r/s tomorrow at 12:00.

## 2020-02-29 ENCOUNTER — Encounter: Payer: Self-pay | Admitting: Family Medicine

## 2020-02-29 ENCOUNTER — Other Ambulatory Visit: Payer: Self-pay

## 2020-02-29 ENCOUNTER — Ambulatory Visit (INDEPENDENT_AMBULATORY_CARE_PROVIDER_SITE_OTHER): Payer: Medicare Other | Admitting: Family Medicine

## 2020-02-29 ENCOUNTER — Ambulatory Visit: Payer: Medicare Other | Admitting: Family Medicine

## 2020-02-29 VITALS — BP 120/62 | HR 77 | Temp 97.7°F | Ht 64.25 in | Wt 234.4 lb

## 2020-02-29 DIAGNOSIS — Z87442 Personal history of urinary calculi: Secondary | ICD-10-CM | POA: Diagnosis not present

## 2020-02-29 DIAGNOSIS — R300 Dysuria: Secondary | ICD-10-CM

## 2020-02-29 DIAGNOSIS — N3001 Acute cystitis with hematuria: Secondary | ICD-10-CM | POA: Diagnosis not present

## 2020-02-29 DIAGNOSIS — E118 Type 2 diabetes mellitus with unspecified complications: Secondary | ICD-10-CM | POA: Diagnosis not present

## 2020-02-29 DIAGNOSIS — N390 Urinary tract infection, site not specified: Secondary | ICD-10-CM | POA: Insufficient documentation

## 2020-02-29 LAB — POC URINALSYSI DIPSTICK (AUTOMATED)
Bilirubin, UA: NEGATIVE
Glucose, UA: NEGATIVE
Ketones, UA: NEGATIVE
Nitrite, UA: NEGATIVE
Protein, UA: POSITIVE — AB
Spec Grav, UA: 1.025 (ref 1.010–1.025)
Urobilinogen, UA: 1 E.U./dL
pH, UA: 6 (ref 5.0–8.0)

## 2020-02-29 LAB — POCT GLYCOSYLATED HEMOGLOBIN (HGB A1C): Hemoglobin A1C: 6.8 % — AB (ref 4.0–5.6)

## 2020-02-29 MED ORDER — CEFTRIAXONE SODIUM 1 G IJ SOLR
1.0000 g | Freq: Once | INTRAMUSCULAR | Status: AC
  Administered 2020-02-29: 1 g via INTRAMUSCULAR

## 2020-02-29 MED ORDER — CEFDINIR 300 MG PO CAPS
300.0000 mg | ORAL_CAPSULE | Freq: Two times a day (BID) | ORAL | 0 refills | Status: DC
Start: 2020-02-29 — End: 2020-04-11

## 2020-02-29 NOTE — Progress Notes (Signed)
This visit was conducted in person.  BP 120/62 (BP Location: Left Arm, Patient Position: Sitting, Cuff Size: Large)   Pulse 77   Temp 97.7 F (36.5 C) (Temporal)   Ht 5' 4.25" (1.632 m)   Wt 234 lb 7 oz (106.3 kg)   SpO2 97%   BMI 39.93 kg/m    CC: ?UTI Subjective:    Patient ID: Roger Mcdaniel, male    DOB: [birth year], 69 y.o.   MRN: [id number]  HPI: KHRISTOPHER KAPAUN is a 69 y.o. male presenting on 02/29/2020 for Dysuria (C/o burning with urination and blood in urine.  Sxs started 02/25/20.  Also, feels strong urge to urinate often, low back pain and fever- max 99.4.  H/o kidney stones. )   4d h/o dysuria, hematuria, urgency and frequency. Tmax 99.7. Chills. 2d ago L lower back pain, now with R groin pain. Incomplete emptying. Some nausea.   No nausea/vomiting, no urethral discharge, no groin rash.   Known diabetic on metformin and glipizide. Fasting cbg running 130s.  Lab Results  Component Value Date   HGBA1C 6.8 (A) 02/29/2020    H/o remote kidney stones - but has not had any flank pain.  No recent UTI.      Relevant past medical, surgical, family and social history reviewed and updated as indicated. Interim medical history since our last visit reviewed. Allergies and medications reviewed and updated. Outpatient Medications Prior to Visit  Medication Sig Dispense Refill  . aspirin 81 MG tablet Take 81 mg by mouth daily.    . blood glucose meter kit and supplies Fill One touch meter that is covered. Check sugar once daily. Dx E11.9 1 each 0  . glucose blood test strip Check sugar once daily Dx E111.9 100 each 12  . Lancets (ONETOUCH ULTRASOFT) lancets Check sugar once daily Dx E11.9 100 each 12  . LIPITOR 40 MG tablet TAKE 1 TABLET DAILY. 90 tablet 1  . lisinopril-hydrochlorothiazide (ZESTORETIC) 20-25 MG tablet Take 1 tablet by mouth daily. 90 tablet 3  . Magnesium 400 MG TABS Take 1 tablet by mouth daily.    . Melatonin 10 MG TABS Take 1 tablet by mouth at bedtime.    .  metFORMIN (GLUCOPHAGE) 500 MG tablet Take 1 tablet (500 mg total) by mouth 2 (two) times daily with a meal. Take 1 tab daily for the first week 180 tablet 2  . naproxen (NAPROSYN) 500 MG tablet TAKE 1 TABLET TWICE A DAY  AS NEEDED FOR MODERATE PAIN(TAKE WITH MEALS) 180 tablet 0  . vitamin C (ASCORBIC ACID) 500 MG tablet Take 1 tablet (500 mg total) by mouth 2 (two) times daily.    [name] zinc gluconate 50 MG tablet Take 50 mg by mouth daily.    [name] glipiZIDE (GLUCOTROL) 5 MG tablet Take 1 tablet (5 mg total) by mouth daily before breakfast. 30 tablet 1   No facility-administered medications prior to visit.     Per HPI unless specifically indicated in ROS section below Review of Systems Objective:  BP 120/62 (BP Location: Left Arm, Patient Position: Sitting, Cuff Size: Large)   Pulse 77   Temp 97.7 F (36.5 C) (Temporal)   Ht 5' 4.25" (1.632 m)   Wt 234 lb 7 oz (106.3 kg)   SpO2 97%   BMI 39.93 kg/m   Wt Readings from Last 3 Encounters:  02/29/20 234 lb 7 oz (106.3 kg)  10/12/19 243 lb (110.2 kg)  09/25/19 250 lb 6 oz (113.6  kg)      Physical Exam Vitals and nursing note reviewed.  Constitutional:      Appearance: Normal appearance. He is obese. He is not ill-appearing.  Abdominal:     General: Abdomen is flat. Bowel sounds are normal. There is no distension.     Palpations: Abdomen is soft. There is no mass.     Tenderness: There is abdominal tenderness (mild) in the right lower quadrant and left lower quadrant. There is no right CVA tenderness, left CVA tenderness, guarding or rebound. Negative signs include Murphy's sign.     Hernia: No hernia is present.  Musculoskeletal:     Right lower leg: Edema (1+) present.     Left lower leg: Edema (1+) present.  Neurological:     Mental Status: He is alert.  Psychiatric:        Mood and Affect: Mood normal.        Behavior: Behavior normal.       Results for orders placed or performed in visit on 02/29/20  POCT Urinalysis Dipstick  (Automated)  Result Value Ref Range   Color, UA yellow    Clarity, UA cloudy    Glucose, UA Negative Negative   Bilirubin, UA negative    Ketones, UA negative    Spec Grav, UA 1.025 1.010 - 1.025   Blood, UA 2+    pH, UA 6.0 5.0 - 8.0   Protein, UA Positive (A) Negative   Urobilinogen, UA 1.0 0.2 or 1.0 E.U./dL   Nitrite, UA negative    Leukocytes, UA Large (3+) (A) Negative  POCT glycosylated hemoglobin (Hb A1C)  Result Value Ref Range   Hemoglobin A1C 6.8 (A) 4.0 - 5.6 %   HbA1c POC (<> result, manual entry)     HbA1c, POC (prediabetic range)     HbA1c, POC (controlled diabetic range)     Assessment & Plan:  This visit occurred during the SARS-CoV-2 public health emergency.  Safety protocols were in place, including screening questions prior to the visit, additional usage of staff PPE, and extensive cleaning of exam room while observing appropriate contact time as indicated for disinfecting solutions.   Problem List Items Addressed This Visit    UTI (urinary tract infection) - Primary    Anticipate complicated hemorrhagic UTI given associated chills and endorsed flank pain although no CVA tenderness on exam. Will treat with rocephin 1gm IM x1 then 10d cefdinir 374m bid course. Push fluids and rest. Red flags to seek urgent care over weekend reviewed. Pt agrees with plan. Will suggest rpt UA at CPE later this summer.       Relevant Medications   cefdinir (OMNICEF) 300 MG capsule   NEPHROLITHIASIS, HX OF    Story not consistent with nephrolithiasis      Controlled diabetes mellitus type 2 with complications ([place]    Update A1c. Congratulated on good control. Continues metformin and glipizide with fcbg's 130s.       Relevant Orders   POCT glycosylated hemoglobin (Hb A1C) (Completed)    Other Visit Diagnoses    Dysuria       Relevant Orders   POCT Urinalysis Dipstick (Automated) (Completed)   Urine Culture       Meds ordered this encounter  Medications  . cefdinir  (OMNICEF) 300 MG capsule    Sig: Take 1 capsule (300 mg total) by mouth 2 (two) times daily.    Dispense:  20 capsule    Refill:  0  . cefTRIAXone (ROCEPHIN)  injection 1 g   Orders Placed This Encounter  Procedures  . Urine Culture  . POCT Urinalysis Dipstick (Automated)  . POCT glycosylated hemoglobin (Hb A1C)    Patient instructions: A1c today - sugar control is doing great!.  Urine suspicious for infection - I do think you have UTI.  Treat with rocephin shot today then start omnicef 350m twice daily for 10 days  Push fluids and rest.  May use tylenol as needed for discomfort  Avoid bladder irritants like caffeinated beverages, soda and spicy foods.  Seek care over weekend if fever >101 or worsening symptoms despite treatment. We should consider rechecking urinalysis at your upcoming physical   Follow up plan: Return if symptoms worsen or fail to improve.  JRia Bush MD

## 2020-02-29 NOTE — Patient Instructions (Addendum)
A1c today - sugar control is doing great!.  Urine suspicious for infection - I do think you have UTI.  Treat with rocephin shot today then start omnicef 300mg  twice daily for 10 days  Push fluids and rest.  May use tylenol as needed for discomfort  Avoid bladder irritants like caffeinated beverages, soda and spicy foods.  Seek care over weekend if fever >101 or worsening symptoms despite treatment. We should consider rechecking urinalysis at your upcoming physical   Urinary Tract Infection, Adult  A urinary tract infection (UTI) is an infection of any part of the urinary tract. The urinary tract includes the kidneys, ureters, bladder, and urethra. These organs make, store, and get rid of urine in the body. Your health care provider may use other names to describe the infection. An upper UTI affects the ureters and kidneys (pyelonephritis). A lower UTI affects the bladder (cystitis) and urethra (urethritis). What are the causes? Most urinary tract infections are caused by bacteria in your genital area, around the entrance to your urinary tract (urethra). These bacteria grow and cause inflammation of your urinary tract. What increases the risk? You are more likely to develop this condition if:  You have a urinary catheter that stays in place (indwelling).  You are not able to control when you urinate or have a bowel movement (you have incontinence).  You are male and you: ? Use a spermicide or diaphragm for birth control. ? Have low estrogen levels. ? Are pregnant.  You have certain genes that increase your risk (genetics).  You are sexually active.  You take antibiotic medicines.  You have a condition that causes your flow of urine to slow down, such as: ? An enlarged prostate, if you are male. ? Blockage in your urethra (stricture). ? A kidney stone. ? A nerve condition that affects your bladder control (neurogenic bladder). ? Not getting enough to drink, or not urinating  often.  You have certain medical conditions, such as: ? Diabetes. ? A weak disease-fighting system (immunesystem). ? Sickle cell disease. ? Gout. ? Spinal cord injury. What are the signs or symptoms? Symptoms of this condition include:  Needing to urinate right away (urgently).  Frequent urination or passing small amounts of urine frequently.  Pain or burning with urination.  Blood in the urine.  Urine that smells bad or unusual.  Trouble urinating.  Cloudy urine.  Vaginal discharge, if you are male.  Pain in the abdomen or the lower back. You may also have:  Vomiting or a decreased appetite.  Confusion.  Irritability or tiredness.  A fever.  Diarrhea. The first symptom in older adults may be confusion. In some cases, they may not have any symptoms until the infection has worsened. How is this diagnosed? This condition is diagnosed based on your medical history and a physical exam. You may also have other tests, including:  Urine tests.  Blood tests.  Tests for sexually transmitted infections (STIs). If you have had more than one UTI, a cystoscopy or imaging studies may be done to determine the cause of the infections. How is this treated? Treatment for this condition includes:  Antibiotic medicine.  Over-the-counter medicines to treat discomfort.  Drinking enough water to stay hydrated. If you have frequent infections or have other conditions such as a kidney stone, you may need to see a health care provider who specializes in the urinary tract (urologist). In rare cases, urinary tract infections can cause sepsis. Sepsis is a life-threatening condition that occurs  when the body responds to an infection. Sepsis is treated in the hospital with IV antibiotics, fluids, and other medicines. Follow these instructions at home:  Medicines  Take over-the-counter and prescription medicines only as told by your health care provider.  If you were prescribed an  antibiotic medicine, take it as told by your health care provider. Do not stop using the antibiotic even if you start to feel better. General instructions  Make sure you: ? Empty your bladder often and completely. Do not hold urine for long periods of time. ? Empty your bladder after sex. ? Wipe from front to back after a bowel movement if you are male. Use each tissue one time when you wipe.  Drink enough fluid to keep your urine pale yellow.  Keep all follow-up visits as told by your health care provider. This is important. Contact a health care provider if:  Your symptoms do not get better after 1-2 days.  Your symptoms go away and then return. Get help right away if you have:  Severe pain in your back or your lower abdomen.  A fever.  Nausea or vomiting. Summary  A urinary tract infection (UTI) is an infection of any part of the urinary tract, which includes the kidneys, ureters, bladder, and urethra.  Most urinary tract infections are caused by bacteria in your genital area, around the entrance to your urinary tract (urethra).  Treatment for this condition often includes antibiotic medicines.  If you were prescribed an antibiotic medicine, take it as told by your health care provider. Do not stop using the antibiotic even if you start to feel better.  Keep all follow-up visits as told by your health care provider. This is important. This information is not intended to replace advice given to you by your health care provider. Make sure you discuss any questions you have with your health care provider. Document Revised: 08/31/2018 Document Reviewed: 03/23/2018 Elsevier Patient Education  2020 [employer].

## 2020-02-29 NOTE — Assessment & Plan Note (Addendum)
Anticipate complicated hemorrhagic UTI given associated chills and endorsed flank pain although no CVA tenderness on exam. Will treat with rocephin 1gm IM x1 then 10d cefdinir 300mg  bid course. Push fluids and rest. Red flags to seek urgent care over weekend reviewed. Pt agrees with plan. Will suggest rpt UA at [facility] later this summer.

## 2020-02-29 NOTE — Assessment & Plan Note (Addendum)
Update A1c. Congratulated on good control. Continues metformin and glipizide with fcbg's 130s.

## 2020-02-29 NOTE — Assessment & Plan Note (Signed)
Story not consistent with nephrolithiasis

## 2020-03-02 LAB — URINE CULTURE
MICRO NUMBER:: 10554504
SPECIMEN QUALITY:: ADEQUATE

## 2020-03-07 ENCOUNTER — Ambulatory Visit
Admission: RE | Admit: 2020-03-07 | Discharge: 2020-03-07 | Disposition: A | Discharge status: Home / Self Care | Payer: Medicare Other | Source: Ambulatory Visit | Attending: Internal Medicine | Admitting: Internal Medicine

## 2020-03-07 ENCOUNTER — Other Ambulatory Visit: Payer: Self-pay

## 2020-03-07 ENCOUNTER — Ambulatory Visit (INDEPENDENT_AMBULATORY_CARE_PROVIDER_SITE_OTHER): Payer: Medicare Other | Admitting: Internal Medicine

## 2020-03-07 ENCOUNTER — Encounter: Payer: Self-pay | Admitting: Internal Medicine

## 2020-03-07 VITALS — BP 130/76 | HR 77 | Temp 98.4°F | Wt 233.0 lb

## 2020-03-07 DIAGNOSIS — N200 Calculus of kidney: Secondary | ICD-10-CM

## 2020-03-07 DIAGNOSIS — R310 Gross hematuria: Secondary | ICD-10-CM | POA: Insufficient documentation

## 2020-03-07 DIAGNOSIS — N134 Hydroureter: Secondary | ICD-10-CM | POA: Diagnosis not present

## 2020-03-07 LAB — POC URINALSYSI DIPSTICK (AUTOMATED)
Bilirubin, UA: NEGATIVE
Glucose, UA: NEGATIVE
Ketones, UA: NEGATIVE
Nitrite, UA: NEGATIVE
Protein, UA: POSITIVE — AB
Spec Grav, UA: 1.02 (ref 1.010–1.025)
Urobilinogen, UA: 1 E.U./dL
pH, UA: 6 (ref 5.0–8.0)

## 2020-03-07 LAB — POCT I-STAT CREATININE: Creatinine, Ser: 0.9 mg/dL (ref 0.61–1.24)

## 2020-03-07 MED ORDER — IOHEXOL 300 MG/ML  SOLN
100.0000 mL | Freq: Once | INTRAMUSCULAR | Status: AC | PRN
  Administered 2020-03-07: 100 mL via INTRAVENOUS

## 2020-03-07 NOTE — Progress Notes (Signed)
HPI  Pt presents to the clinic today with c/o blood in his urine. He reports he noticed this this morning. He was seen 6/4 for similar issues. He was prescribed Omnicef for a UTI, urine culture is pending. He reports his urgency, frequency and dysuria have completely resolved. He denies fever, chills, nausea or low back pain today. He is concerned about the ongoing blood in his urine. He does have a history of kidney stones but reports this feels different. He is not on any blood thinners other than ASA. He has no family history of bladder or prostate cancer. He does not smoke. He has been working on a lawnmower, doing some strenuous pulling, not sure if that is related or not.   Review of Systems  Past Medical History:  Diagnosis Date  . Chest pain   . CMC arthritis, thumb, degenerative   . DDD (degenerative disc disease), cervical 2015   s/o ACDF (Nudelman)  . Dry skin   . GERD 07/10/2008   Qualifier: Diagnosis of  By: Hetty Ely MD, Franne Grip   . GERD (gastroesophageal reflux disease) 09/2003  . History of diabetes mellitus 07/2016   resolved with 44 lb weight loss  . History of ETT 05/23/2000   Nml (Dr. Elease Hashimoto)  . HNP (herniated nucleus pulposus), cervical 12/20/2013  . Hyperlipidemia 09/2003  . Hypertension 06/2004  . Kidney stones    takes Magnesium, Zinc and Fish oil, and B complex to prevent kidney stones  . OSA (obstructive sleep apnea)    was on CPAP, currently off    Family History  Problem Relation Age of Onset  . Heart disease Father        MI x 2  . Hypertension Brother   . Obesity Brother   . Heart disease Paternal Grandfather         MI  . CVA Mother   . Cancer Other     Social History   Socioeconomic History  . Marital status: Widowed    Spouse name: Not on file  . Number of children: Not on file  . Years of education: Not on file  . Highest education level: Not on file  Occupational History  . Occupation: Astronomer    Comment: Programmer, applications  Tobacco Use  . Smoking status: Never Smoker  . Smokeless tobacco: Never Used  Substance and Sexual Activity  . Alcohol use: No    Alcohol/week: 1.0 standard drink    Types: 1 drink(s) per week    Comment: rarely  . Drug use: No  . Sexual activity: Yes  Other Topics Concern  . Not on file  Social History Narrative   Married. 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 Difficulty of Paying Living Expenses:   Food Insecurity:   . Worried About Programme researcher, broadcasting/film/video in the Last Year:   . Barista in the Last Year:   Transportation Needs:   . Freight forwarder (Medical):   [name] Lack of Transportation (Non-Medical):   Physical Activity:   . Days of Exercise per Week:   . Minutes of Exercise per Session:   Stress:   . Feeling of Stress :   Social Connections:   . Frequency of Communication with Friends and Family:   .  Frequency of Social Gatherings with Friends and Family:   . Attends Religious Services:   . Active Member of Clubs or Organizations:   . Attends Archivist Meetings:   [name] Marital Status:   Intimate Partner Violence:   . Fear of Current or Ex-Partner:   . Emotionally Abused:   [name] Physically Abused:   . Sexually Abused:     Allergies  Allergen Reactions  . Codeine Sulfate Nausea Only    REACTION: nausea  . Pravachol [Pravastatin Sodium] Other (See Comments)    Muscle aches     Constitutional: Denies fever, malaise, fatigue, headache or abrupt weight changes.   GU: Pt reports blood in his urine. Denies urgency, frequency, dysuria, burning sensation, odor or discharge. Skin: Denies redness, rashes, lesions or ulcercations.   No other specific complaints in a complete review of systems (except as listed in HPI above).    Objective:   Physical Exam  BP  130/76   Pulse 77   Temp 98.4 F (36.9 C) (Temporal)   Wt 233 lb (105.7 kg)   SpO2 98%   BMI 39.68 kg/m   Wt Readings from Last 3 Encounters:  02/29/20 234 lb 7 oz (106.3 kg)  10/12/19 243 lb (110.2 kg)  09/25/19 250 lb 6 oz (113.6 kg)    General: Appears his stated age, obese, in NAD. Cardiovascular: Normal rate. Pulmonary/Chest: Normal effort.  Abdomen: Soft and nontender. Normal bowel sounds. Ventral hernia noted. No CVA tenderness.        Assessment & Plan:   Hematuria:  Urinalysis: 3+ blood Will send urine culture Continue Omnicef for now Drink plenty of fluids CBC, BMET today STAT CT abd/pelvis for further evaluation- c/w 14 mm kidney stone. D/w pt, questions answered RX for Flomax 0.4 mg caps PO daily RX for Hydrocodone 5-325 mg PO Q6H prn #20, 0 refills Urgent referral to urology placed  Return precautions discussed  RTC as needed or if symptoms persist. Webb Silversmith, NP

## 2020-03-08 ENCOUNTER — Encounter: Payer: Self-pay | Admitting: Family Medicine

## 2020-03-08 ENCOUNTER — Encounter: Payer: Self-pay | Admitting: Internal Medicine

## 2020-03-08 DIAGNOSIS — I700 Atherosclerosis of aorta: Secondary | ICD-10-CM

## 2020-03-08 LAB — BASIC METABOLIC PANEL
BUN: 23 mg/dL (ref 7–25)
CO2: 25 mmol/L (ref 20–32)
Calcium: 9.2 mg/dL (ref 8.6–10.3)
Chloride: 102 mmol/L (ref 98–110)
Creat: 0.99 mg/dL (ref 0.70–1.25)
Glucose, Bld: 107 mg/dL — ABNORMAL HIGH (ref 65–99)
Potassium: 4.4 mmol/L (ref 3.5–5.3)
Sodium: 140 mmol/L (ref 135–146)

## 2020-03-08 LAB — CBC
HCT: 43.9 % (ref 38.5–50.0)
Hemoglobin: 14.9 g/dL (ref 13.2–17.1)
MCH: 30.5 pg (ref 27.0–33.0)
MCHC: 33.9 g/dL (ref 32.0–36.0)
MCV: 90 fL (ref 80.0–100.0)
MPV: 8.7 fL (ref 7.5–12.5)
Platelets: 335 10*3/uL (ref 140–400)
RBC: 4.88 10*6/uL (ref 4.20–5.80)
RDW: 12.6 % (ref 11.0–15.0)
WBC: 10.5 10*3/uL (ref 3.8–10.8)

## 2020-03-09 ENCOUNTER — Encounter: Payer: Self-pay | Admitting: Internal Medicine

## 2020-03-09 LAB — URINE CULTURE
MICRO NUMBER:: 10581550
Result:: NO GROWTH
SPECIMEN QUALITY:: ADEQUATE

## 2020-03-09 MED ORDER — TAMSULOSIN HCL 0.4 MG PO CAPS
0.4000 mg | ORAL_CAPSULE | Freq: Every day | ORAL | 3 refills | Status: DC
Start: 2020-03-09 — End: 2021-05-22

## 2020-03-09 MED ORDER — HYDROCODONE-ACETAMINOPHEN 5-325 MG PO TABS: 1.0000 | ORAL_TABLET | Freq: Four times a day (QID) | ORAL | 0 refills | Status: DC | PRN

## 2020-03-09 NOTE — Patient Instructions (Signed)

## 2020-03-10 DIAGNOSIS — I700 Atherosclerosis of aorta: Secondary | ICD-10-CM | POA: Insufficient documentation

## 2020-03-11 ENCOUNTER — Encounter: Payer: Self-pay | Admitting: Family Medicine

## 2020-03-11 ENCOUNTER — Other Ambulatory Visit: Payer: Self-pay | Admitting: Urology

## 2020-03-11 DIAGNOSIS — N200 Calculus of kidney: Secondary | ICD-10-CM | POA: Diagnosis not present

## 2020-03-11 DIAGNOSIS — N23 Unspecified renal colic: Secondary | ICD-10-CM | POA: Diagnosis not present

## 2020-03-11 DIAGNOSIS — R310 Gross hematuria: Secondary | ICD-10-CM | POA: Diagnosis not present

## 2020-03-12 ENCOUNTER — Other Ambulatory Visit: Payer: Self-pay | Admitting: Urology

## 2020-03-13 ENCOUNTER — Other Ambulatory Visit (HOSPITAL_COMMUNITY)
Admission: RE | Admit: 2020-03-13 | Discharge: 2020-03-13 | Disposition: A | Discharge status: Home / Self Care | Payer: Medicare Other | Source: Ambulatory Visit | Attending: Urology | Admitting: Urology

## 2020-03-13 DIAGNOSIS — Z01812 Encounter for preprocedural laboratory examination: Secondary | ICD-10-CM | POA: Insufficient documentation

## 2020-03-13 DIAGNOSIS — Z20822 Contact with and (suspected) exposure to covid-19: Secondary | ICD-10-CM | POA: Diagnosis not present

## 2020-03-13 LAB — SARS CORONAVIRUS 2 (TAT 6-24 HRS): SARS Coronavirus 2: NEGATIVE

## 2020-03-13 NOTE — Progress Notes (Addendum)
Patient to arrive at 0645 on 03/17/2020. Pre-procedure instructions given. Instructed to stop ASA and naproxen. Will not take metformin dose morning of procedure.History and medications reviewed. Driver secured. BP med in am with sip of water.

## 2020-03-17 ENCOUNTER — Encounter (HOSPITAL_BASED_OUTPATIENT_CLINIC_OR_DEPARTMENT_OTHER)
Admission: RE | Disposition: A | Discharge status: Home / Self Care | Payer: Self-pay | Source: Home / Self Care | Attending: Urology

## 2020-03-17 ENCOUNTER — Ambulatory Visit (HOSPITAL_BASED_OUTPATIENT_CLINIC_OR_DEPARTMENT_OTHER)
Admission: RE | Admit: 2020-03-17 | Discharge: 2020-03-17 | Disposition: A | Discharge status: Home / Self Care | Payer: Medicare Other | Attending: Urology | Admitting: Urology

## 2020-03-17 ENCOUNTER — Encounter (HOSPITAL_BASED_OUTPATIENT_CLINIC_OR_DEPARTMENT_OTHER): Payer: Self-pay | Admitting: Urology

## 2020-03-17 ENCOUNTER — Ambulatory Visit (HOSPITAL_COMMUNITY): Payer: Medicare Other

## 2020-03-17 ENCOUNTER — Other Ambulatory Visit: Payer: Self-pay

## 2020-03-17 DIAGNOSIS — E785 Hyperlipidemia, unspecified: Secondary | ICD-10-CM | POA: Diagnosis not present

## 2020-03-17 DIAGNOSIS — Z7982 Long term (current) use of aspirin: Secondary | ICD-10-CM | POA: Diagnosis not present

## 2020-03-17 DIAGNOSIS — G4733 Obstructive sleep apnea (adult) (pediatric): Secondary | ICD-10-CM | POA: Diagnosis not present

## 2020-03-17 DIAGNOSIS — N200 Calculus of kidney: Secondary | ICD-10-CM | POA: Insufficient documentation

## 2020-03-17 DIAGNOSIS — Z888 Allergy status to other drugs, medicaments and biological substances status: Secondary | ICD-10-CM | POA: Insufficient documentation

## 2020-03-17 DIAGNOSIS — E669 Obesity, unspecified: Secondary | ICD-10-CM | POA: Diagnosis not present

## 2020-03-17 DIAGNOSIS — E119 Type 2 diabetes mellitus without complications: Secondary | ICD-10-CM | POA: Diagnosis not present

## 2020-03-17 DIAGNOSIS — K219 Gastro-esophageal reflux disease without esophagitis: Secondary | ICD-10-CM | POA: Diagnosis not present

## 2020-03-17 DIAGNOSIS — Z885 Allergy status to narcotic agent status: Secondary | ICD-10-CM | POA: Diagnosis not present

## 2020-03-17 DIAGNOSIS — N201 Calculus of ureter: Secondary | ICD-10-CM | POA: Diagnosis present

## 2020-03-17 DIAGNOSIS — Z6841 Body Mass Index (BMI) 40.0 and over, adult: Secondary | ICD-10-CM | POA: Insufficient documentation

## 2020-03-17 DIAGNOSIS — I10 Essential (primary) hypertension: Secondary | ICD-10-CM | POA: Insufficient documentation

## 2020-03-17 DIAGNOSIS — Z7984 Long term (current) use of oral hypoglycemic drugs: Secondary | ICD-10-CM | POA: Insufficient documentation

## 2020-03-17 DIAGNOSIS — Z791 Long term (current) use of non-steroidal anti-inflammatories (NSAID): Secondary | ICD-10-CM | POA: Insufficient documentation

## 2020-03-17 DIAGNOSIS — Z79899 Other long term (current) drug therapy: Secondary | ICD-10-CM | POA: Insufficient documentation

## 2020-03-17 HISTORY — PX: EXTRACORPOREAL SHOCK WAVE LITHOTRIPSY: SHX1557

## 2020-03-17 LAB — GLUCOSE, CAPILLARY: Glucose-Capillary: 140 mg/dL — ABNORMAL HIGH (ref 70–99)

## 2020-03-17 SURGERY — LITHOTRIPSY, ESWL
Anesthesia: LOCAL | Laterality: Right

## 2020-03-17 MED ORDER — CIPROFLOXACIN HCL 500 MG PO TABS
ORAL_TABLET | ORAL | Status: AC
  Filled 2020-03-17: qty 1

## 2020-03-17 MED ORDER — DIPHENHYDRAMINE HCL 25 MG PO CAPS
ORAL_CAPSULE | ORAL | Status: AC
  Filled 2020-03-17: qty 1

## 2020-03-17 MED ORDER — DIAZEPAM 5 MG PO TABS
10.0000 mg | ORAL_TABLET | ORAL | Status: AC
  Administered 2020-03-17: 10 mg via ORAL

## 2020-03-17 MED ORDER — DIPHENHYDRAMINE HCL 25 MG PO CAPS
25.0000 mg | ORAL_CAPSULE | ORAL | Status: AC
  Administered 2020-03-17: 25 mg via ORAL

## 2020-03-17 MED ORDER — SODIUM CHLORIDE 0.9 % IV SOLN: INTRAVENOUS | Status: DC

## 2020-03-17 MED ORDER — CIPROFLOXACIN HCL 500 MG PO TABS
500.0000 mg | ORAL_TABLET | ORAL | Status: AC
  Administered 2020-03-17: 500 mg via ORAL

## 2020-03-17 MED ORDER — DIAZEPAM 5 MG PO TABS
ORAL_TABLET | ORAL | Status: AC
  Filled 2020-03-17: qty 2

## 2020-03-17 NOTE — H&P (Signed)
Urology Preoperative H&P   Chief Complaint: Right flank pain  History of Present Illness: Roger Mcdaniel is a 69 y.o. male with a history of right flank pain.  Recent CTU found a 13 mm right UPJ stone.  He is here today for right ESWL.  He denies interval fever/chills, N/V, dysuria or hematuria.     Past Medical History:  Diagnosis Date  . Chest pain   . CMC arthritis, thumb, degenerative   . DDD (degenerative disc disease), cervical 2015   s/o ACDF (Nudelman)  . Diabetes mellitus without complication (HCC)   . Dry skin   . GERD 07/10/2008   Qualifier: Diagnosis of  By: Hetty Ely MD, Franne Grip   . GERD (gastroesophageal reflux disease) 09/2003  . History of diabetes mellitus 07/2016   resolved with 44 lb weight loss  . History of ETT 05/23/2000   Nml (Dr. Elease Hashimoto)  . HNP (herniated nucleus pulposus), cervical 12/20/2013  . Hyperlipidemia 09/2003  . Hypertension 06/2004  . Kidney stones    takes Magnesium, Zinc and Fish oil, and B complex to prevent kidney stones  . OSA (obstructive sleep apnea)    was on CPAP, currently off    Past Surgical History:  Procedure Laterality Date  . ANTERIOR CERVICAL DECOMP/DISCECTOMY FUSION N/A 01/14/2014   Procedure: ANTERIOR CERVICAL DECOMPRESSION/DISCECTOMY FUSION CERVICAL FOUR-FIVE,CERVICAL FIVE-SIX ;  Surgeon: Hewitt Shorts, MD  . CATARACT EXTRACTION Right 02/28/2019   Per patient  . CATARACT EXTRACTION Left 03/12/2019   Per patient  . ORIF ANKLE FRACTURE  1980   right  . RETINAL LASER PROCEDURE Right 03/14/2019   Per patient  . SEPTOPLASTY  2005   for sleep apnea //cpap  . SHOULDER SURGERY Right 06/2017   Dr Rennis Chris at [facility] orth[facility]  . WRIST ARTHROSCOPY WITH CARPOMETACARPEL (CMC) ARTHROPLASTY Right 11/2018   R CMC and R MCP arthroplasty (Gramig)    Allergies:  Allergies  Allergen Reactions  . Codeine Sulfate Nausea Only    REACTION: nausea  . Pravachol [Pravastatin Sodium] Other (See Comments)    Muscle aches    Family  History  Problem Relation Age of Onset  . Heart disease Father        MI x 2  . Hypertension Brother   . Obesity Brother   . Heart disease Paternal Grandfather         MI  . CVA Mother   . Cancer Other     Social History:  reports that he has never smoked. He has never used smokeless tobacco. He reports that he does not drink alcohol and does not use drugs.  ROS: A complete review of systems was performed.  All systems are negative except for pertinent findings as noted.  Physical Exam:  Vital signs in last 24 hours: Temp:  [97.8 F (36.6 C)] 97.8 F (36.6 C) (06/21 0737) Pulse Rate:  [66] 66 (06/21 0737) Resp:  [18] 18 (06/21 0737) BP: (145)/(72) 145/72 (06/21 0737) SpO2:  [99 %] 99 % (06/21 0737) Weight:  [106.1 kg] 106.1 kg (06/21 0737) Constitutional:  Alert and oriented, No acute distress Cardiovascular: Regular rate and rhythm, No JVD Respiratory: Normal respiratory effort, Lungs clear bilaterally GI: Abdomen is soft, nontender, nondistended, no abdominal masses GU: No CVA tenderness Lymphatic: No lymphadenopathy Neurologic: Grossly intact, no focal deficits Psychiatric: Normal mood and affect  Laboratory Data:  No results for input(s): WBC, HGB, HCT, PLT in the last 72 hours.  No results for input(s): NA, K, CL, GLUCOSE,  BUN, CALCIUM, CREATININE in the last 72 hours.  Invalid input(s): CO3   Results for orders placed or performed during the hospital encounter of 03/17/20 (from the past 24 hour(s))  Glucose, capillary     Status: Abnormal   Collection Time: 03/17/20  7:43 AM  Result Value Ref Range   Glucose-Capillary 140 (H) 70 - 99 mg/dL   Recent Results (from the past 240 hour(s))  Urine Culture     Status: None   Collection Time: 03/07/20  5:27 PM   Specimen: Urine  Result Value Ref Range Status   MICRO NUMBER: [id number]  Final   SPECIMEN QUALITY: Adequate  Final   Sample Source NOT GIVEN  Final   STATUS: FINAL  Final   Result: No Growth  Final   SARS CORONAVIRUS 2 (TAT 6-24 HRS) Nasopharyngeal Nasopharyngeal Swab     Status: None   Collection Time: 03/13/20  8:25 AM   Specimen: Nasopharyngeal Swab  Result Value Ref Range Status   SARS Coronavirus 2 NEGATIVE NEGATIVE Final    Comment: (NOTE) SARS-CoV-2 target nucleic acids are NOT DETECTED.  The SARS-CoV-2 RNA is generally detectable in upper and lower respiratory specimens during the acute phase of infection. Negative results do not preclude SARS-CoV-2 infection, do not rule out co-infections with other pathogens, and should not be used as the sole basis for treatment or other patient management decisions. Negative results must be combined with clinical observations, patient history, and epidemiological information. The expected result is Negative.  Fact Sheet for Patients: SugarRoll.be  Fact Sheet for Healthcare Providers: https://www.woods-mathews.com/  This test is not yet approved or cleared by the [place] FDA and  has been authorized for detection and/or diagnosis of SARS-CoV-2 by FDA under an Emergency Use Authorization (EUA). This EUA will remain  in effect (meaning this test can be used) for the duration of the COVID-19 declaration under Se ction 564(b)(1) of the Act, 21 U.S.C. section 360bbb-3(b)(1), unless the authorization is terminated or revoked sooner.  Performed at [facility], [place] [address], [place], [place] [address]     Renal Function: No results for input(s): CREATININE in the last 168 hours. Estimated Creatinine Clearance: 87.9 mL/min (by C-G formula based on SCr of 0.9 mg/dL).  Radiologic Imaging: No results found.  I independently reviewed the above imaging studies.  Assessment and Plan Roger Mcdaniel is a 69 y.o. male with 13 mm right UPJ stone   The risks, benefits and alternatives of RIGHT ESWL was discussed with the patient. I described the risks which include arrhythmia, kidney  contusion, kidney hemorrhage, need for transfusion, back discomfort, flank ecchymosis, flank abrasion, inability to fracture the stone, inability to pass stone fragments, Steinstrasse, infection associated with obstructing stones, need for an alternative surgical procedure and possible need for repeat shockwave lithotripsy.  The patient voices understanding and wishes to proceed.    Ellison Hughs, MD 03/17/2020, 8:23 AM  [facility] Pager: [phone]

## 2020-03-17 NOTE — Op Note (Signed)
[facility] Operative Note  Treating Physician: Rhoderick Moody, MD  Pre-op diagnosis: 1.3 cm right UPJ stone  Post-op diagnosis: Same   Procedure: RIGHT ESWL  See Rojelio Brenner OP note scanned into chart. Also because of the size, density, location and other factors that cannot be anticipated I feel this will likely be a staged procedure. This fact supersedes any indication in the scanned [place] stone operative note to the contrary

## 2020-03-17 NOTE — Discharge Instructions (Signed)
Post Anesthesia Home Care Instructions  Activity: Get plenty of rest for the remainder of the day. A responsible adult should stay with you for 24 hours following the procedure.  For the next 24 hours, DO NOT: -Drive a car -Operate machinery -Drink alcoholic beverages -Take any medication unless instructed by your physician -Make any legal decisions or sign important papers.  Meals: Start with liquid foods such as gelatin or soup. Progress to regular foods as tolerated. Avoid greasy, spicy, heavy foods. If nausea and/or vomiting occur, drink only clear liquids until the nausea and/or vomiting subsides. Call your physician if vomiting continues.  Special Instructions/Symptoms: Your throat may feel dry or sore from the anesthesia or the breathing tube placed in your throat during surgery. If this causes discomfort, gargle with warm salt water. The discomfort should disappear within 24 hours.  If you had a scopolamine patch placed behind your ear for the management of post- operative nausea and/or vomiting:  1. The medication in the patch is effective for 72 hours, after which it should be removed.  Wrap patch in a tissue and discard in the trash. Wash hands thoroughly with soap and water. 2. You may remove the patch earlier than 72 hours if you experience unpleasant side effects which may include dry mouth, dizziness or visual disturbances. 3. Avoid touching the patch. Wash your hands with soap and water after contact with the patch.   Lithotripsy, Care After This sheet gives you information about how to care for yourself after your procedure. Your health care provider may also give you more specific instructions. If you have problems or questions, contact your health care provider. What can I expect after the procedure? After the procedure, it is common to have:  Some blood in your urine. This should only last for a few days.  Soreness in your back, sides, or upper abdomen for a few  days.  Blotches or bruises on your back where the pressure wave entered the skin.  Pain, discomfort, or nausea when pieces (fragments) of the kidney stone move through the tube that carries urine from the kidney to the bladder (ureter). Stone fragments may pass soon after the procedure, but they may continue to pass for up to 4-8 weeks. ? If you have severe pain or nausea, contact your health care provider. This may be caused by a large stone that was not broken up, and this may mean that you need more treatment.  Some pain or discomfort during urination.  Some pain or discomfort in the lower abdomen or (in men) at the base of the penis. Follow these instructions at home: Medicines  Take over-the-counter and prescription medicines only as told by your health care provider.  If you were prescribed an antibiotic medicine, take it as told by your health care provider. Do not stop taking the antibiotic even if you start to feel better.  Do not drive for 24 hours if you were given a medicine to help you relax (sedative).  Do not drive or use heavy machinery while taking prescription pain medicine. Eating and drinking      Drink enough water and fluids to keep your urine clear or pale yellow. This helps any remaining pieces of the stone to pass. It can also help prevent new stones from forming.  Eat plenty of fresh fruits and vegetables.  Follow instructions from your health care provider about eating and drinking restrictions. You may be instructed: ? To reduce how much salt (sodium) you eat   or drink. Check ingredients and nutrition facts on packaged foods and beverages. ? To reduce how much meat you eat.  Eat the recommended amount of calcium for your age and gender. Ask your health care provider how much calcium you should have. General instructions  Get plenty of rest.  Most people can resume normal activities 1-2 days after the procedure. Ask your health care provider what  activities are safe for you.  Your health care provider may direct you to lie in a certain position (postural drainage) and tap firmly (percuss) over your kidney area to help stone fragments pass. Follow instructions as told by your health care provider.  If directed, strain all urine through the strainer that was provided by your health care provider. ? Keep all fragments for your health care provider to see. Any stones that are found may be sent to a medical lab for examination. The stone may be as small as a grain of salt.  Keep all follow-up visits as told by your health care provider. This is important. Contact a health care provider if:  You have pain that is severe or does not get better with medicine.  You have nausea that is severe or does not go away.  You have blood in your urine longer than your health care provider told you to expect.  You have more blood in your urine.  You have pain during urination that does not go away.  You urinate more frequently than usual and this does not go away.  You develop a rash or any other possible signs of an allergic reaction. Get help right away if:  You have severe pain in your back, sides, or upper abdomen.  You have severe pain while urinating.  Your urine is very dark red.  You have blood in your stool (feces).  You cannot pass any urine at all.  You feel a strong urge to urinate after emptying your bladder.  You have a fever or chills.  You develop shortness of breath, difficulty breathing, or chest pain.  You have severe nausea that leads to persistent vomiting.  You faint. Summary  After this procedure, it is common to have some pain, discomfort, or nausea when pieces (fragments) of the kidney stone move through the tube that carries urine from the kidney to the bladder (ureter). If this pain or nausea is severe, however, you should contact your health care provider.  Most people can resume normal activities 1-2  days after the procedure. Ask your health care provider what activities are safe for you.  Drink enough water and fluids to keep your urine clear or pale yellow. This helps any remaining pieces of the stone to pass, and it can help prevent new stones from forming.  If directed, strain your urine and keep all fragments for your health care provider to see. Fragments or stones may be as small as a grain of salt.  Get help right away if you have severe pain in your back, sides, or upper abdomen or have severe pain while urinating. This information is not intended to replace advice given to you by your health care provider. Make sure you discuss any questions you have with your health care provider. Document Revised: 12/25/2018 Document Reviewed: 08/04/2016 Elsevier Patient Education  2020 Elsevier Inc.  

## 2020-03-18 ENCOUNTER — Encounter (HOSPITAL_BASED_OUTPATIENT_CLINIC_OR_DEPARTMENT_OTHER): Payer: Self-pay | Admitting: Urology

## 2020-04-02 DIAGNOSIS — N200 Calculus of kidney: Secondary | ICD-10-CM | POA: Diagnosis not present

## 2020-04-03 ENCOUNTER — Other Ambulatory Visit: Payer: Self-pay | Admitting: Family Medicine

## 2020-04-03 DIAGNOSIS — E118 Type 2 diabetes mellitus with unspecified complications: Secondary | ICD-10-CM

## 2020-04-03 DIAGNOSIS — E785 Hyperlipidemia, unspecified: Secondary | ICD-10-CM

## 2020-04-04 ENCOUNTER — Other Ambulatory Visit (INDEPENDENT_AMBULATORY_CARE_PROVIDER_SITE_OTHER): Payer: Medicare Other

## 2020-04-04 ENCOUNTER — Other Ambulatory Visit: Payer: Self-pay

## 2020-04-04 ENCOUNTER — Ambulatory Visit (INDEPENDENT_AMBULATORY_CARE_PROVIDER_SITE_OTHER): Payer: Medicare Other

## 2020-04-04 DIAGNOSIS — E785 Hyperlipidemia, unspecified: Secondary | ICD-10-CM

## 2020-04-04 DIAGNOSIS — Z0000 Encounter for general adult medical examination without abnormal findings: Secondary | ICD-10-CM | POA: Diagnosis not present

## 2020-04-04 DIAGNOSIS — E118 Type 2 diabetes mellitus with unspecified complications: Secondary | ICD-10-CM | POA: Diagnosis not present

## 2020-04-04 DIAGNOSIS — E1169 Type 2 diabetes mellitus with other specified complication: Secondary | ICD-10-CM

## 2020-04-04 LAB — COMPREHENSIVE METABOLIC PANEL
ALT: 17 U/L (ref 0–53)
AST: 18 U/L (ref 0–37)
Albumin: 4.1 g/dL (ref 3.5–5.2)
Alkaline Phosphatase: 68 U/L (ref 39–117)
BUN: 20 mg/dL (ref 6–23)
CO2: 30 mEq/L (ref 19–32)
Calcium: 9.3 mg/dL (ref 8.4–10.5)
Chloride: 101 mEq/L (ref 96–112)
Creatinine, Ser: 0.98 mg/dL (ref 0.40–1.50)
GFR: 76.11 mL/min (ref >60.00)
Glucose, Bld: 126 mg/dL — ABNORMAL HIGH (ref 70–99)
Potassium: 4.8 mEq/L (ref 3.5–5.1)
Sodium: 138 mEq/L (ref 135–145)
Total Bilirubin: 1 mg/dL (ref 0.2–1.2)
Total Protein: 6.1 g/dL (ref 6.0–8.3)

## 2020-04-04 LAB — LIPID PANEL
Cholesterol: 103 mg/dL (ref 0–200)
HDL: 37.5 mg/dL — ABNORMAL LOW (ref >39.00)
LDL Cholesterol: 49 mg/dL (ref 0–99)
NonHDL: 65.2
Total CHOL/HDL Ratio: 3
Triglycerides: 82 mg/dL (ref 0.0–149.0)
VLDL: 16.4 mg/dL (ref 0.0–40.0)

## 2020-04-04 LAB — HEMOGLOBIN A1C: Hgb A1c MFr Bld: 6.9 % — ABNORMAL HIGH (ref 4.6–6.5)

## 2020-04-04 NOTE — Progress Notes (Signed)
Subjective:   Roger Mcdaniel is a 69 y.o. male who presents for Medicare Annual/Subsequent preventive examination.  Review of Systems: N/A      I connected with the patient today by telephone and verified that I am speaking with the correct person using two identifiers. Location patient: home Location nurse: work Persons participating in the virtual visit: patient, Marine scientist.   I discussed the limitations, risks, security and privacy concerns of performing an evaluation and management service by telephone and the availability of in person appointments. I also discussed with the patient that there may be a patient responsible charge related to this service. The patient expressed understanding and verbally consented to this telephonic visit.    Interactive audio and video telecommunications were attempted between this nurse and patient, however failed, due to patient having technical difficulties OR patient did not have access to video capability.  We continued and completed visit with audio only.     Cardiac Risk Factors include: advanced age (>69mn, >>3women);diabetes mellitus;hypertension;dyslipidemia;male gender     Objective:    Today's Vitals   04/04/20 0900  PainSc: 0-No pain   There is no height or weight on file to calculate BMI.  Advanced Directives 04/04/2020 03/17/2020 01/10/2014  Does Patient Have a Medical Advance Directive? Yes Yes Patient does not have advance directive;Patient would not like information  Type of AScientist, forensicPower of [place] will -  Copy of [place] Chart? Yes - validated most recent copy scanned in chart (See row information) No - copy requested -    Current Medications (verified) Outpatient Encounter Medications as of 04/04/2020  Medication Sig  . aspirin 81 MG tablet Take 81 mg by mouth daily.  . blood glucose meter kit and supplies Fill One touch meter that is  covered. Check sugar once daily. Dx E11.9  . glucose blood test strip Check sugar once daily Dx E111.9  . Lancets (ONETOUCH ULTRASOFT) lancets Check sugar once daily Dx E11.9  . LIPITOR 40 MG tablet TAKE 1 TABLET DAILY.  .Marland Kitchenlisinopril-hydrochlorothiazide (ZESTORETIC) 20-25 MG tablet Take 1 tablet by mouth daily.  . Magnesium 400 MG TABS Take 1 tablet by mouth daily.  . Melatonin 10 MG TABS Take 1 tablet by mouth at bedtime.  . metFORMIN (GLUCOPHAGE) 500 MG tablet Take 1 tablet (500 mg total) by mouth 2 (two) times daily with a meal. Take 1 tab daily for the first week  . naproxen (NAPROSYN) 500 MG tablet TAKE 1 TABLET TWICE A DAY  AS NEEDED FOR MODERATE PAIN(TAKE WITH MEALS)  . tamsulosin (FLOMAX) 0.4 MG CAPS capsule Take 1 capsule (0.4 mg total) by mouth daily.  . vitamin C (ASCORBIC ACID) 500 MG tablet Take 1 tablet (500 mg total) by mouth 2 (two) times daily.  .Marland Kitchenzinc gluconate 50 MG tablet Take 50 mg by mouth daily.  . cefdinir (OMNICEF) 300 MG capsule Take 1 capsule (300 mg total) by mouth 2 (two) times daily.  .Marland KitchenHYDROcodone-acetaminophen (NORCO) 5-325 MG tablet Take 1 tablet by mouth every 6 (six) hours as needed for moderate pain.   No facility-administered encounter medications on file as of 04/04/2020.    Allergies (verified) Codeine sulfate and Pravachol [pravastatin sodium]   History: Past Medical History:  Diagnosis Date  . Chest pain   . CMC arthritis, thumb, degenerative   . DDD (degenerative disc disease), cervical 2015   s/o ACDF ([place])  . Diabetes mellitus without complication (  HCC)   . Dry skin   . GERD 07/10/2008   Qualifier: Diagnosis of  By: Council Mechanic MD, Hilaria Ota   . GERD (gastroesophageal reflux disease) 09/2003  . History of diabetes mellitus 07/2016   resolved with 44 lb weight loss  . History of ETT 05/23/2000   Nml (Dr. Acie Fredrickson)  . HNP (herniated nucleus pulposus), cervical 12/20/2013  . Hyperlipidemia 09/2003  . Hypertension 06/2004  . Kidney stones     takes Magnesium, Zinc and Fish oil, and B complex to prevent kidney stones  . OSA (obstructive sleep apnea)    was on CPAP, currently off   Past Surgical History:  Procedure Laterality Date  . ANTERIOR CERVICAL DECOMP/DISCECTOMY FUSION N/A 01/14/2014   Procedure: ANTERIOR CERVICAL DECOMPRESSION/DISCECTOMY FUSION CERVICAL FOUR-FIVE,CERVICAL FIVE-SIX ;  Surgeon: Hosie Spangle, MD  . CATARACT EXTRACTION Right 02/28/2019   Per patient  . CATARACT EXTRACTION Left 03/12/2019   Per patient  . EXTRACORPOREAL SHOCK WAVE LITHOTRIPSY Right 03/17/2020   Procedure: RIGHT EXTRACORPOREAL SHOCK WAVE LITHOTRIPSY (ESWL);  Surgeon: Ceasar Mons, MD;  [facility];  Service: Urology;  Laterality: Right;  . ORIF ANKLE FRACTURE  1980   right  . RETINAL LASER PROCEDURE Right 03/14/2019   Per patient  . SEPTOPLASTY  2005   for sleep apnea //cpap  . SHOULDER SURGERY Right 06/2017   Dr Onnie Graham [facility]  . WRIST ARTHROSCOPY WITH CARPOMETACARPEL (CMC) ARTHROPLASTY Right 11/2018   R CMC and R MCP arthroplasty ([facility])   Family History  Problem Relation Age of Onset  . Heart disease Father        MI x 2  . Hypertension Brother   . Obesity Brother   . Heart disease Paternal Grandfather         MI  . CVA Mother   . Cancer Other    Social History   Socioeconomic History  . Marital status: Widowed    Spouse name: Not on file  . Number of children: Not on file  . Years of education: Not on file  . Highest education level: Not on file  Occupational History  . 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 Smoking status: Never Smoker  . Smokeless tobacco: Never Used  Substance and Sexual Activity  . Alcohol use: Yes    Alcohol/week: 1.0 standard drink    Types: 1 Standard drinks or equivalent per week    Comment: 1-2 weekly   . Drug use: No  . Sexual activity: Yes  Other Topics Concern  . Not on file  Social History  Narrative   Married. Widower - wife passed after leg surgery 11/2014   Children: 2 out of house   Occ: retired, was Software engineer of [place]   Activity: no regular exercise   Social Determinants of Radio broadcast assistant Strain: Low Risk   . Difficulty of Paying Living Expenses: Not hard at all  Food Insecurity: No Food Insecurity  . Worried About Charity fundraiser in the Last Year: Never true  . Ran Out of Food in the Last Year: Never true  Transportation Needs: No Transportation Needs  . Lack of Transportation (Medical): No  . Lack of Transportation (Non-Medical): No  Physical Activity: Inactive  . Days of Exercise per Week: 0 days  . Minutes of Exercise per Session: 0 min  Stress: No Stress Concern Present  . Feeling of Stress : Not at  all  Social Connections:   . Frequency of Communication with Friends and Family:   . Frequency of Social Gatherings with Friends and Family:   . Attends Religious Services:   . Active Member of Clubs or Organizations:   . Attends Archivist Meetings:   [name] Marital Status:     Tobacco Counseling Counseling given: Not Answered   Clinical Intake:  Pre-visit preparation completed: Yes  Pain : No/denies pain Pain Score: 0-No pain     Nutritional Risks: None Diabetes: Yes CBG done?: No Did pt. bring in CBG monitor from home?: No  How often do you need to have someone help you when you read instructions, pamphlets, or other written materials from your doctor or pharmacy?: 1 - Never What is the last grade level you completed in school?: 12th  Diabetic: Yes Nutrition Risk Assessment:  Has the patient had any N/V/D within the last 2 months?  No  Does the patient have any non-healing wounds?  No  Has the patient had any unintentional weight loss or weight gain?  No   Diabetes:  Is the patient diabetic?  Yes  If diabetic, was a CBG obtained today?  No  Did the patient bring in their glucometer from home?  No  How  often do you monitor your CBG's? Every morning.   Financial Strains and Diabetes Management:  Are you having any financial strains with the device, your supplies or your medication? No .  Does the patient want to be seen by Chronic Care Management for management of their diabetes?  No  Would the patient like to be referred to a Nutritionist or for Diabetic Management?  No   Diabetic Exams:  Diabetic Eye Exam: Overdue for diabetic eye exam. Pt has been advised about the importance in completing this exam. Patient advised to call and schedule an eye exam. Diabetic Foot Exam: Completed 10/12/2019   Interpreter Needed?: No  Information entered by :: [place], LPN   Activities of Daily Living In your present state of health, do you have any difficulty performing the following activities: 04/04/2020 03/17/2020  Hearing? N N  Vision? N N  Difficulty concentrating or making decisions? N N  Walking or climbing stairs? N N  Dressing or bathing? N N  Doing errands, shopping? N -  Preparing Food and eating ? N -  Using the Toilet? N -  In the past six months, have you accidently leaked urine? N -  Do you have problems with loss of bowel control? N -  Managing your Medications? N -  Managing your Finances? N -  Housekeeping or managing your Housekeeping? N -  Some recent data might be hidden    Patient Care Team: Ria Bush, MD as PCP - General (Family Medicine)  Indicate any recent Medical Services you may have received from other than [facility] providers in the past year (date may be approximate).     Assessment:   This is a routine wellness examination for [place].  Hearing/Vision screen  Hearing Screening   '125Hz'  '250Hz'  '500Hz'  '1000Hz'  '2000Hz'  '3000Hz'  '4000Hz'  '6000Hz'  '8000Hz'   Right ear:           Left ear:           Vision Screening Comments: Patient gets annual eye exams   Dietary issues and exercise activities discussed: Current Exercise Habits: The patient does not participate in  regular exercise at present, Exercise limited by: None identified  Goals    . Patient Stated  04/04/2020, I will maintain and continue medications as prescribed.       Depression Screen PHQ 2/9 Scores 04/04/2020 12/12/2017 12/10/2016  PHQ - 2 Score 0 0 0  PHQ- 9 Score 0 - -    Fall Risk Fall Risk  04/04/2020 08/22/2019 12/12/2017 12/10/2016  Falls in the past year? 0 0 No No  Comment - Emmi Telephone Survey: data to providers prior to load - -  Number falls in past yr: 0 - - -  Injury with Fall? 0 - - -  Risk for fall due to : Medication side effect - - -  Follow up Falls evaluation completed;Falls prevention discussed - - -    Any stairs in or around the home? Yes  If so, are there any without handrails? No  Home free of loose throw rugs in walkways, pet beds, electrical cords, etc? Yes  Adequate lighting in your home to reduce risk of falls? Yes   ASSISTIVE DEVICES UTILIZED TO PREVENT FALLS:  Life alert? No  Use of a cane, walker or w/c? No  Grab bars in the bathroom? No  Shower chair or bench in shower? No  Elevated toilet seat or a handicapped toilet? No   TIMED UP AND GO:  Was the test performed? N/A, telephonic visit.    Cognitive Function: MMSE - Mini Mental State Exam 04/04/2020  Orientation to time 5  Orientation to Place 5  Registration 3  Attention/ Calculation 5  Recall 3  Language- repeat 1       Mini Cog  Mini-Cog screen was completed. Maximum score is 22. A value of 0 denotes this part of the MMSE was not completed or the patient failed this part of the Mini-Cog screening.  Immunizations Immunization History  Administered Date(s) Administered  . Fluad Quad(high Dose 65+) 07/26/2019  . Influenza, High Dose Seasonal PF 08/01/2017  . Influenza-Unspecified 07/14/2016  . PFIZER SARS-COV-2 Vaccination 10/22/2019, 11/12/2019  . Pneumococcal Conjugate-13 12/12/2017  . Pneumococcal Polysaccharide-23 10/12/2019  . Td 01/30/1999  . Tdap 05/05/2011     TDAP status: Up to date Flu Vaccine status: Up to date Pneumococcal vaccine status: Up to date Covid-19 vaccine status: Completed vaccines  Qualifies for Shingles Vaccine? Yes   Zostavax completed No   Shingrix Completed?: No.    Education has been provided regarding the importance of this vaccine. Patient has been advised to call insurance company to determine out of pocket expense if they have not yet received this vaccine. Advised may also receive vaccine at local pharmacy or Health Dept. Verbalized acceptance and understanding.  Screening Tests Health Maintenance  Topic Date Due  . DTAP VACCINES (1) 08/14/1951  . OPHTHALMOLOGY EXAM  11/27/2019  . COLONOSCOPY  10/11/2020 (Originally 06/14/2002)  . INFLUENZA VACCINE  04/27/2020  . COLON CANCER SCREENING ANNUAL FOBT  05/01/2020  . HEMOGLOBIN A1C  08/30/2020  . FOOT EXAM  10/11/2020  . DTaP/Tdap/Td (3 - Td or Tdap) 05/04/2021  . TETANUS/TDAP  05/04/2021  . COVID-19 Vaccine  Completed  . Hepatitis C Screening  Completed  . PNA vac Low Risk Adult  Completed    Health Maintenance  Health Maintenance Due  Topic Date Due  . DTAP VACCINES (1) 08/14/1951  . OPHTHALMOLOGY EXAM  11/27/2019    Colorectal cancer screening: Completed FOBT 05/02/2019. Repeat every 1 years  Lung Cancer Screening: (Low Dose CT Chest recommended if Age 5-80 years, 30 pack-year currently smoking OR have quit w/in 15years.) does not qualify.    Additional  Screening:  Hepatitis C Screening: does qualify; Completed 12/05/2017  Vision Screening: Recommended annual ophthalmology exams for early detection of glaucoma and other disorders of the eye. Is the patient up to date with their annual eye exam?  No , He will schedule an appointment soon. Who is the provider or what is the name of the office in which the patient attends annual eye exams? [facility] in [place] If pt is not established with a provider, would they like to be referred to a provider  to establish care? No .   Dental Screening: Recommended annual dental exams for proper oral hygiene  Community Resource Referral / Chronic Care Management: CRR required this visit?  No   CCM required this visit?  No      Plan:     I have personally reviewed and noted the following in the patient's chart:   . Medical and social history . Use of alcohol, tobacco or illicit drugs  . Current medications and supplements . Functional ability and status . Nutritional status . Physical activity . Advanced directives . List of other physicians . Hospitalizations, surgeries, and ER visits in previous 12 months . Vitals . Screenings to include cognitive, depression, and falls . Referrals and appointments  In addition, I have reviewed and discussed with patient certain preventive protocols, quality metrics, and best practice recommendations. A written personalized care plan for preventive services as well as general preventive health recommendations were provided to patient.   Due to this being a telephonic visit, the after visit summary with patients personalized plan was offered to patient via mail or my-chart. Patient preferred to pick up at office at next visit.   Andrez Grime, LPN   04/30/2102

## 2020-04-04 NOTE — Patient Instructions (Signed)
Roger Mcdaniel , Thank you for taking time to come for your Medicare Wellness Visit. I appreciate your ongoing commitment to your health goals. Please review the following plan we discussed and let me know if I can assist you in the future.   Screening recommendations/referrals: Colonoscopy: FOBT completed 05/02/2019, due 04/2020 Recommended yearly ophthalmology/optometry visit for glaucoma screening and checkup Recommended yearly dental visit for hygiene and checkup  Vaccinations: Influenza vaccine: Up to date, completed 07/27/2019, due 04/2020 Pneumococcal vaccine: Completed series Tdap vaccine: Up to date, completed 05/05/2011, due 04/2021 Shingles vaccine: due, check with insurance regarding coverage   Covid-19: Completed series  Advanced directives: copy in chart  Conditions/risks identified: diabetes, hypertension, hyperlipidemia  Next appointment: Follow up in one year for your annual wellness visit.   Preventive Care 68 Years and Older, Male Preventive care refers to lifestyle choices and visits with your health care provider that can promote health and wellness. What does preventive care include?  A yearly physical exam. This is also called an annual well check.  Dental exams once or twice a year.  Routine eye exams. Ask your health care provider how often you should have your eyes checked.  Personal lifestyle choices, including:  Daily care of your teeth and gums.  Regular physical activity.  Eating a healthy diet.  Avoiding tobacco and drug use.  Limiting alcohol use.  Practicing safe sex.  Taking low doses of aspirin every day.  Taking vitamin and mineral supplements as recommended by your health care provider. What happens during an annual well check? The services and screenings done by your health care provider during your annual well check will depend on your age, overall health, lifestyle risk factors, and family history of disease. Counseling  Your health care  provider may ask you questions about your:  Alcohol use.  Tobacco use.  Drug use.  Emotional well-being.  Home and relationship well-being.  Sexual activity.  Eating habits.  History of falls.  Memory and ability to understand (cognition).  Work and work Astronomer. Screening  You may have the following tests or measurements:  Height, weight, and BMI.  Blood pressure.  Lipid and cholesterol levels. These may be checked every 5 years, or more frequently if you are over 72 years old.  Skin check.  Lung cancer screening. You may have this screening every year starting at age 45 if you have a 30-pack-year history of smoking and currently smoke or have quit within the past 15 years.  Fecal occult blood test (FOBT) of the stool. You may have this test every year starting at age 54.  Flexible sigmoidoscopy or colonoscopy. You may have a sigmoidoscopy every 5 years or a colonoscopy every 10 years starting at age 42.  Prostate cancer screening. Recommendations will vary depending on your family history and other risks.  Hepatitis C blood test.  Hepatitis B blood test.  Sexually transmitted disease (STD) testing.  Diabetes screening. This is done by checking your blood sugar (glucose) after you have not eaten for a while (fasting). You may have this done every 1-3 years.  Abdominal aortic aneurysm (AAA) screening. You may need this if you are a current or former smoker.  Osteoporosis. You may be screened starting at age 11 if you are at high risk. Talk with your health care provider about your test results, treatment options, and if necessary, the need for more tests. Vaccines  Your health care provider may recommend certain vaccines, such as:  Influenza vaccine. This is  recommended every year.  Tetanus, diphtheria, and acellular pertussis (Tdap, Td) vaccine. You may need a Td booster every 10 years.  Zoster vaccine. You may need this after age 70.  Pneumococcal  13-valent conjugate (PCV13) vaccine. One dose is recommended after age 32.  Pneumococcal polysaccharide (PPSV23) vaccine. One dose is recommended after age 62. Talk to your health care provider about which screenings and vaccines you need and how often you need them. This information is not intended to replace advice given to you by your health care provider. Make sure you discuss any questions you have with your health care provider. Document Released: 10/10/2015 Document Revised: 06/02/2016 Document Reviewed: 07/15/2015 Elsevier Interactive Patient Education  2017 [place] Prevention in the Home Falls can cause injuries. They can happen to people of all ages. There are many things you can do to make your home safe and to help prevent falls. What can I do on the outside of my home?  Regularly fix the edges of walkways and driveways and fix any cracks.  Remove anything that might make you trip as you walk through a door, such as a raised step or threshold.  Trim any bushes or trees on the path to your home.  Use bright outdoor lighting.  Clear any walking paths of anything that might make someone trip, such as rocks or tools.  Regularly check to see if handrails are loose or broken. Make sure that both sides of any steps have handrails.  Any raised decks and porches should have guardrails on the edges.  Have any leaves, snow, or ice cleared regularly.  Use sand or salt on walking paths during winter.  Clean up any spills in your garage right away. This includes oil or grease spills. What can I do in the bathroom?  Use night lights.  Install grab bars by the toilet and in the tub and shower. Do not use towel bars as grab bars.  Use non-skid mats or decals in the tub or shower.  If you need to sit down in the shower, use a plastic, non-slip stool.  Keep the floor dry. Clean up any water that spills on the floor as soon as it happens.  Remove soap buildup in the  tub or shower regularly.  Attach bath mats securely with double-sided non-slip rug tape.  Do not have throw rugs and other things on the floor that can make you trip. What can I do in the bedroom?  Use night lights.  Make sure that you have a light by your bed that is easy to reach.  Do not use any sheets or blankets that are too big for your bed. They should not hang down onto the floor.  Have a firm chair that has side arms. You can use this for support while you get dressed.  Do not have throw rugs and other things on the floor that can make you trip. What can I do in the kitchen?  Clean up any spills right away.  Avoid walking on wet floors.  Keep items that you use a lot in easy-to-reach places.  If you need to reach something above you, use a strong step stool that has a grab bar.  Keep electrical cords out of the way.  Do not use floor polish or wax that makes floors slippery. If you must use wax, use non-skid floor wax.  Do not have throw rugs and other things on the floor that can make you trip.  What can I do with my stairs?  Do not leave any items on the stairs.  Make sure that there are handrails on both sides of the stairs and use them. Fix handrails that are broken or loose. Make sure that handrails are as long as the stairways.  Check any carpeting to make sure that it is firmly attached to the stairs. Fix any carpet that is loose or worn.  Avoid having throw rugs at the top or bottom of the stairs. If you do have throw rugs, attach them to the floor with carpet tape.  Make sure that you have a light switch at the top of the stairs and the bottom of the stairs. If you do not have them, ask someone to add them for you. What else can I do to help prevent falls?  Wear shoes that:  Do not have high heels.  Have rubber bottoms.  Are comfortable and fit you well.  Are closed at the toe. Do not wear sandals.  If you use a stepladder:  Make sure that it  is fully opened. Do not climb a closed stepladder.  Make sure that both sides of the stepladder are locked into place.  Ask someone to hold it for you, if possible.  Clearly mark and make sure that you can see:  Any grab bars or handrails.  First and last steps.  Where the edge of each step is.  Use tools that help you move around (mobility aids) if they are needed. These include:  Canes.  Walkers.  Scooters.  Crutches.  Turn on the lights when you go into a dark area. Replace any light bulbs as soon as they burn out.  Set up your furniture so you have a clear path. Avoid moving your furniture around.  If any of your floors are uneven, fix them.  If there are any pets around you, be aware of where they are.  Review your medicines with your doctor. Some medicines can make you feel dizzy. This can increase your chance of falling. Ask your doctor what other things that you can do to help prevent falls. This information is not intended to replace advice given to you by your health care provider. Make sure you discuss any questions you have with your health care provider. Document Released: 07/10/2009 Document Revised: 02/19/2016 Document Reviewed: 10/18/2014 Elsevier Interactive Patient Education  2017 [employer].

## 2020-04-04 NOTE — Progress Notes (Signed)
PCP notes:  Health Maintenance: Eye exam- due, will schedule his own appointment   Abnormal Screenings: none   Patient concerns: Elevated blood sugar readings   Nurse concerns: none   Next PCP appt: 04/11/2020 @ 8:30 am

## 2020-04-11 ENCOUNTER — Encounter: Payer: Self-pay | Admitting: Family Medicine

## 2020-04-11 ENCOUNTER — Ambulatory Visit (INDEPENDENT_AMBULATORY_CARE_PROVIDER_SITE_OTHER): Payer: Medicare Other | Admitting: Family Medicine

## 2020-04-11 ENCOUNTER — Other Ambulatory Visit: Payer: Self-pay

## 2020-04-11 VITALS — BP 140/70 | HR 63 | Temp 97.9°F | Ht 64.25 in | Wt 235.2 lb

## 2020-04-11 DIAGNOSIS — N200 Calculus of kidney: Secondary | ICD-10-CM

## 2020-04-11 DIAGNOSIS — Z6841 Body Mass Index (BMI) 40.0 and over, adult: Secondary | ICD-10-CM

## 2020-04-11 DIAGNOSIS — I10 Essential (primary) hypertension: Secondary | ICD-10-CM | POA: Diagnosis not present

## 2020-04-11 DIAGNOSIS — N281 Cyst of kidney, acquired: Secondary | ICD-10-CM

## 2020-04-11 DIAGNOSIS — Z1211 Encounter for screening for malignant neoplasm of colon: Secondary | ICD-10-CM | POA: Diagnosis not present

## 2020-04-11 DIAGNOSIS — E1169 Type 2 diabetes mellitus with other specified complication: Secondary | ICD-10-CM | POA: Diagnosis not present

## 2020-04-11 DIAGNOSIS — E118 Type 2 diabetes mellitus with unspecified complications: Secondary | ICD-10-CM

## 2020-04-11 DIAGNOSIS — M1711 Unilateral primary osteoarthritis, right knee: Secondary | ICD-10-CM

## 2020-04-11 DIAGNOSIS — E785 Hyperlipidemia, unspecified: Secondary | ICD-10-CM | POA: Diagnosis not present

## 2020-04-11 DIAGNOSIS — I700 Atherosclerosis of aorta: Secondary | ICD-10-CM | POA: Diagnosis not present

## 2020-04-11 NOTE — Assessment & Plan Note (Signed)
Continue aspirin, statin.  

## 2020-04-11 NOTE — Assessment & Plan Note (Signed)
Chronic adequate. Continue current regimen.

## 2020-04-11 NOTE — Patient Instructions (Addendum)
Pass by lab to pick up stool kit.  Continue regular walking routine. Work on [employer].  Return as needed or in 6 months for diabetes follow up visit.   Health Maintenance After Age 68 After age 76, you are at a higher risk for certain long-term diseases and infections as well as injuries from falls. Falls are a major cause of broken bones and head injuries in people who are older than age 65. Getting regular preventive care can help to keep you healthy and well. Preventive care includes getting regular testing and making lifestyle changes as recommended by your health care provider. Talk with your health care provider about:  Which screenings and tests you should have. A screening is a test that checks for a disease when you have no symptoms.  A diet and exercise plan that is right for you. What should I know about screenings and tests to prevent falls? Screening and testing are the best ways to find a health problem early. Early diagnosis and treatment give you the best chance of managing medical conditions that are common after age 33. Certain conditions and lifestyle choices may make you more likely to have a fall. Your health care provider may recommend:  Regular vision checks. Poor vision and conditions such as cataracts can make you more likely to have a fall. If you wear glasses, make sure to get your prescription updated if your vision changes.  Medicine review. Work with your health care provider to regularly review all of the medicines you are taking, including over-the-counter medicines. Ask your health care provider about any side effects that may make you more likely to have a fall. Tell your health care provider if any medicines that you take make you feel dizzy or sleepy.  Osteoporosis screening. Osteoporosis is a condition that causes the bones to get weaker. This can make the bones weak and cause them to break more easily.  Blood pressure screening. Blood pressure  changes and medicines to control blood pressure can make you feel dizzy.  Strength and balance checks. Your health care provider may recommend certain tests to check your strength and balance while standing, walking, or changing positions.  Foot health exam. Foot pain and numbness, as well as not wearing proper footwear, can make you more likely to have a fall.  Depression screening. You may be more likely to have a fall if you have a fear of falling, feel emotionally low, or feel unable to do activities that you used to do.  Alcohol use screening. Using too much alcohol can affect your balance and may make you more likely to have a fall. What actions can I take to lower my risk of falls? General instructions  Talk with your health care provider about your risks for falling. Tell your health care provider if: ? You fall. Be sure to tell your health care provider about all falls, even ones that seem minor. ? You feel dizzy, sleepy, or off-balance.  Take over-the-counter and prescription medicines only as told by your health care provider. These include any supplements.  Eat a healthy diet and maintain a healthy weight. A healthy diet includes low-fat dairy products, low-fat (lean) meats, and fiber from whole grains, beans, and lots of fruits and vegetables. Home safety  Remove any tripping hazards, such as rugs, cords, and clutter.  Install safety equipment such as grab bars in bathrooms and safety rails on stairs.  Keep rooms and walkways well-lit. Activity   Follow a  regular exercise program to stay fit. This will help you maintain your balance. Ask your health care provider what types of exercise are appropriate for you.  If you need a cane or walker, use it as recommended by your health care provider.  Wear supportive shoes that have nonskid soles. Lifestyle  Do not drink alcohol if your health care provider tells you not to drink.  If you drink alcohol, limit how much you  have: ? 0-1 drink a day for women. ? 0-2 drinks a day for men.  Be aware of how much alcohol is in your drink. In the [place] one drink equals one typical bottle of beer (12 oz), one-half glass of wine (5 oz), or one shot of hard liquor (1 oz).  Do not use any products that contain nicotine or tobacco, such as cigarettes and e-cigarettes. If you need help quitting, ask your health care provider. Summary  Having a healthy lifestyle and getting preventive care can help to protect your health and wellness after age 66.  Screening and testing are the best way to find a health problem early and help you avoid having a fall. Early diagnosis and treatment give you the best chance for managing medical conditions that are more common for people who are older than age 1.  Falls are a major cause of broken bones and head injuries in people who are older than age 66. Take precautions to prevent a fall at home.  Work with your health care provider to learn what changes you can make to improve your health and wellness and to prevent falls. This information is not intended to replace advice given to you by your health care provider. Make sure you discuss any questions you have with your health care provider. Document Revised: 01/04/2019 Document Reviewed: 07/27/2017 Elsevier Patient Education  2020 [employer].

## 2020-04-11 NOTE — Assessment & Plan Note (Signed)
Bilateral, known end stage on right has been recommended knee replacement but trying to postpone, on NSAID regularly.

## 2020-04-11 NOTE — Assessment & Plan Note (Addendum)
By CT. Largest on left side.

## 2020-04-11 NOTE — Assessment & Plan Note (Signed)
Large 53mm stone s/p ESWL on flomax. Appreciate urology care.

## 2020-04-11 NOTE — Assessment & Plan Note (Signed)
Chronic, stable on lipitor. Continue. HDL mildly low.  The ASCVD Risk score Denman George DC Jr., et al., 2013) failed to calculate for the following reasons:   The valid total cholesterol range is 130 to 320 mg/dL

## 2020-04-11 NOTE — Assessment & Plan Note (Signed)
Encouraged healthy diet and lifestyle changes to affect sustainable weight loss.  

## 2020-04-11 NOTE — Progress Notes (Signed)
This visit was conducted in person.  BP 140/70 (BP Location: Left Arm, Patient Position: Sitting, Cuff Size: Large)   Pulse 63   Temp 97.9 F (36.6 C) (Temporal)   Ht 5' 4.25" (1.632 m)   Wt 235 lb 3 oz (106.7 kg)   SpO2 97%   BMI 40.06 kg/m    CC: CPE Subjective:    Patient ID: Roger Mcdaniel, male    DOB: 1951/02/10, 69 y.o.   MRN: [id number]  HPI: Roger Mcdaniel is a 69 y.o. male presenting on 04/11/2020 for Annual Exam (Prt 2.  States next eye exam, 04/21/20.)   Saw health advisor last week for medicare wellness visit. Note reviewed.   No exam data present    Clinical Support from 04/04/2020 in [place] at [facility] Total Score 0      Fall Risk  04/04/2020 08/22/2019 12/12/2017 12/10/2016  Falls in the past year? 0 0 No No  Comment - Emmi Telephone Survey: data to providers prior to load - -  Number falls in past yr: 0 - - -  Injury with Fall? 0 - - -  Risk for fall due to : Medication side effect - - -  Follow up Falls evaluation completed;Falls prevention discussed - - -    Recent ESWL for 65m R UPJ stone (Winter). Continues passing small pieces of stone. Planned close f/u with urology. Incidentally found renal cysts as well as abd aortic ATH.   Home BP readings running very well controlled at home [id number]. He is on lisinopril hctz 20/274mdaily as well as flomax nightly (started last month).   cbg yesterday 5pm 111. Fasting this morning 131. Fasting tends to run 120-150s.   Continues working at [employer] office   Preventative: Colon cancer screening - does yearly stool kit.No h/o blood instool. Prostate cancer screening - discussed. Would like to continue.  Flu shot -yearly Tdap 2012 Prevnar 11/2017, pnuemovax 09/2019  COVID vaccine - completed Pf[facility]levard/2021  Shingrix - discussed- was too expensive  Advanced directives received and scanned 05/2017. HCPOA are son RoHeath Larkhen DIL Heather. [facility]  make decisions for life-prolonging measures. Seat belt use discussed. Sunscreen use discussed. No changing moles on skin. 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 Interim medical history since our last visit reviewed. Allergies and medications reviewed and updated. Outpatient Medications Prior to Visit  Medication Sig Dispense Refill  . aspirin 81 MG tablet Take 81 mg by mouth daily.    . blood glucose meter kit and supplies Fill One touch meter that is covered. Check sugar once daily. Dx E11.9 1 each 0  . glucose blood test strip Check sugar once daily Dx E111.9 100 each 12  . Lancets (ONETOUCH ULTRASOFT) lancets Check sugar once daily Dx E11.9 100 each 12  . LIPITOR 40 MG tablet TAKE 1 TABLET DAILY. 90 tablet 1  .  lisinopril-hydrochlorothiazide (ZESTORETIC) 20-25 MG tablet Take 1 tablet by mouth daily. 90 tablet 3  . Magnesium 400 MG TABS Take 1 tablet by mouth daily.    . Melatonin 10 MG TABS Take 1 tablet by mouth at bedtime.    . metFORMIN (GLUCOPHAGE) 500 MG tablet Take 1 tablet (500 mg total) by mouth 2 (two) times daily with a meal. Take 1 tab daily for the first week 180 tablet 2  . naproxen (NAPROSYN) 500 MG tablet TAKE 1 TABLET TWICE A DAY  AS NEEDED FOR MODERATE PAIN(TAKE WITH MEALS) 180 tablet 0  . tamsulosin (FLOMAX) 0.4 MG CAPS capsule Take 1 capsule (0.4 mg total) by mouth daily. 30 capsule 3  . vitamin C (ASCORBIC ACID) 500 MG tablet Take 1  tablet (500 mg total) by mouth 2 (two) times daily.    [name] zinc gluconate 50 MG tablet Take 50 mg by mouth daily.    . cefdinir (OMNICEF) 300 MG capsule Take 1 capsule (300 mg total) by mouth 2 (two) times daily. 20 capsule 0  . HYDROcodone-acetaminophen (NORCO) 5-325 MG tablet Take 1 tablet by mouth every 6 (six) hours as needed for moderate pain. 20 tablet 0   No facility-administered medications prior to visit.     Per HPI unless specifically indicated in ROS section below Review of Systems Objective:  BP 140/70 (BP Location: Left Arm, Patient Position: Sitting, Cuff Size: Large)   Pulse 63   Temp 97.9 F (36.6 C) (Temporal)   Ht 5' 4.25" (1.632 m)   Wt 235 lb 3 oz (106.7 kg)   SpO2 97%   BMI 40.06 kg/m   Wt Readings from Last 3 Encounters:  04/11/20 235 lb 3 oz (106.7 kg)  03/17/20 234 lb (106.1 kg)  03/07/20 233 lb (105.7 kg)      Physical Exam Vitals and nursing note reviewed.  Constitutional:      General: He is not in acute distress.    Appearance: Normal appearance. He is well-developed. He is obese. He is not ill-appearing.  HENT:     Head: Normocephalic and atraumatic.     Right Ear: Hearing, tympanic membrane, ear canal and external ear normal.     Left Ear: Hearing, tympanic membrane, ear canal and external ear normal.  Eyes:     General: No scleral icterus.    Extraocular Movements: Extraocular movements intact.     Conjunctiva/sclera: Conjunctivae normal.     Pupils: Pupils are equal, round, and reactive to light.  Cardiovascular:     Rate and Rhythm: Normal rate and regular rhythm.     Pulses: Normal pulses.          Radial pulses are 2+ on the right side and 2+ on the left side.     Heart sounds: Normal heart sounds. No murmur heard.   Pulmonary:     Effort: Pulmonary effort is normal. No respiratory distress.     Breath sounds: Normal breath sounds. No wheezing, rhonchi or rales.  Abdominal:     General: Abdomen is flat. Bowel sounds are normal. There  is no distension.     Palpations: Abdomen is soft. There is no mass.     Tenderness: There is no abdominal tenderness. There is no guarding or rebound.     Hernia: No hernia is present.  Musculoskeletal:        General: Normal range of motion.     Cervical back: Normal range of motion and neck supple.  Right lower leg: Edema (tr) present.     Left lower leg: Edema (tr) present.  Lymphadenopathy:     Cervical: No cervical adenopathy.  Skin:    General: Skin is warm and dry.     Findings: No rash.  Neurological:     General: No focal deficit present.     Mental Status: He is alert and oriented to person, place, and time.     Comments: CN grossly intact, station and gait intact  Psychiatric:        Mood and Affect: Mood normal.        Behavior: Behavior normal.        Thought Content: Thought content normal.        Judgment: Judgment normal.       Results for orders placed or performed in visit on 04/04/20  Hemoglobin A1c  Result Value Ref Range   Hgb A1c MFr Bld 6.9 (H) 4.6 - 6.5 %  Comprehensive metabolic panel  Result Value Ref Range   Sodium 138 135 - 145 mEq/L   Potassium 4.8 3.5 - 5.1 mEq/L   Chloride 101 96 - 112 mEq/L   CO2 30 19 - 32 mEq/L   Glucose, Bld 126 (H) 70 - 99 mg/dL   BUN 20 6 - 23 mg/dL   Creatinine, Ser 0.98 0.40 - 1.50 mg/dL   Total Bilirubin 1.0 0.2 - 1.2 mg/dL   Alkaline Phosphatase 68 39 - 117 U/L   AST 18 0 - 37 U/L   ALT 17 0 - 53 U/L   Total Protein 6.1 6.0 - 8.3 g/dL   Albumin 4.1 3.5 - 5.2 g/dL   GFR 76.11 >60.00 mL/min   Calcium 9.3 8.4 - 10.5 mg/dL  Lipid panel  Result Value Ref Range   Cholesterol 103 0 - 200 mg/dL   Triglycerides 82.0 0 - 149 mg/dL   HDL 37.50 (L) >39.00 mg/dL   VLDL 16.4 0.0 - 40.0 mg/dL   LDL Cholesterol 49 0 - 99 mg/dL   Total CHOL/HDL Ratio 3    NonHDL 65.20    Lab Results  Component Value Date   PSA 0.95 04/03/2019   PSA 1.12 12/05/2017   PSA 1.36 12/03/2016   Assessment & Plan:  This visit  occurred during the SARS-CoV-2 public health emergency.  Safety protocols were in place, including screening questions prior to the visit, additional usage of staff PPE, and extensive cleaning of exam room while observing appropriate contact time as indicated for disinfecting solutions.  Did not check PSA this year - will update PSA next labwork.  Problem List Items Addressed This Visit    Right nephrolithiasis    Large 43m stone s/p ESWL on flomax. Appreciate urology care.       Renal cysts, acquired, bilateral    By CT. Largest on left side.       Primary osteoarthritis of right knee    Bilateral, known end stage on right has been recommended knee replacement but trying to postpone, on NSAID regularly.       Hyperlipidemia associated with type 2 diabetes mellitus (HCC)    Chronic, stable on lipitor. Continue. HDL mildly low.  The ASCVD Risk score (Mikey BussingDC Jr., et al., 2013) failed to calculate for the following reasons:   The valid total cholesterol range is 130 to 320 mg/dL       Essential hypertension, benign    Chronic adequate. Continue current regimen.       Controlled diabetes  mellitus type 2 with complications (HCC) - Primary    Chronic, adequate on current regimen. Encouraged ongoing efforts for weight loss.       BMI 40.0-44.9, adult (HCC)    Encouraged healthy diet and lifestyle changes to affect sustainable weight loss.       Atherosclerosis of aorta (HCC)    Continue aspirin, statin.        Other Visit Diagnoses    Special screening for malignant neoplasms, colon       Relevant Orders   Fecal occult blood, imunochemical       No orders of the defined types were placed in this encounter.  Orders Placed This Encounter  Procedures  . Fecal occult blood, imunochemical    Standing Status:   Future    Standing Expiration Date:   04/11/2021    Patient instructions: Pass by lab to pick up stool kit.  Continue regular walking routine. Work on [employer].  Return as needed or in 6 months for diabetes follow up visit.   Follow up plan: Return in about 6 months (around 10/12/2020), or if symptoms worsen or fail to improve, for follow up visit.  Ria Bush, MD

## 2020-04-11 NOTE — Assessment & Plan Note (Addendum)
Chronic, adequate on current regimen. Encouraged ongoing efforts for weight loss.

## 2020-04-18 ENCOUNTER — Other Ambulatory Visit (INDEPENDENT_AMBULATORY_CARE_PROVIDER_SITE_OTHER): Payer: Medicare Other

## 2020-04-18 DIAGNOSIS — Z1211 Encounter for screening for malignant neoplasm of colon: Secondary | ICD-10-CM | POA: Diagnosis not present

## 2020-04-18 LAB — FECAL OCCULT BLOOD, IMMUNOCHEMICAL: Fecal Occult Bld: NEGATIVE

## 2020-04-21 LAB — HM DIABETES EYE EXAM

## 2020-04-22 DIAGNOSIS — E119 Type 2 diabetes mellitus without complications: Secondary | ICD-10-CM | POA: Diagnosis not present

## 2020-05-05 DIAGNOSIS — N200 Calculus of kidney: Secondary | ICD-10-CM | POA: Diagnosis not present

## 2020-05-06 ENCOUNTER — Other Ambulatory Visit: Payer: Self-pay | Admitting: Urology

## 2020-05-17 ENCOUNTER — Other Ambulatory Visit: Payer: Self-pay | Admitting: Family Medicine

## 2020-05-19 ENCOUNTER — Encounter (HOSPITAL_BASED_OUTPATIENT_CLINIC_OR_DEPARTMENT_OTHER): Payer: Self-pay | Admitting: Urology

## 2020-05-19 ENCOUNTER — Other Ambulatory Visit: Payer: Self-pay

## 2020-05-19 NOTE — Progress Notes (Addendum)
Spoke w/ via phone for pre-op interview---pt Lab needs dos---- I STAT 8, EKG              COVID test -----05-24-2020 AT 925 AM- Arrive at -------830 AM 05-28-2020 NPO after MN NO Solid Food.  Clear liquids from MN until---730 AM Medications to take morning of surgery -----ATORVASTATIN, TAMSULOSIN Diabetic medication -----NONE DAY OF SURGERY Patient Special Instructions -----NONE Pre-Op special Istructions -----NONE Patient verbalized understanding of instructions that were given at this phone interview. Patient denies shortness of breath, chest pain, fever, cough at this phone interview   Anesthesia Review: pt with htn, hyperlipdemia, no chest pain since 2013, lov dr Melburn Popper 12-23-2015 epic says follow up in 1 year (pt states hes had no issues so he has not been back), osa no cpap used, type 2 dm pt denies cardiac S & S, pt denies sob at pre op call, has instructions from dr bell to stop 81 mg aspirin 5 days before surgery    PCP: dr Sharin Grave 02-29-2020 epic Cardiologist : lov dr Melburn Popper 12-23-2015 epic Chest x-ray :none EKG :none recent Echo :10-15-2011 epic Stress test:none Cardiac Cath : none Activity level: climbs flight of starts without difficulty, does own housework Sleep Study/ CPAP :osa no cpap used, last sleep study  07-18-2000 epic Fasting Blood Sugar :  120-130    / Checks Blood Sugar 1-2 times a day:   Blood Thinner/ Instructions /Last Dose:n/a ASA / Instructions/ Last Dose : instructed to stop 81 mg aspirin 5 days before surgery from dr bell

## 2020-05-19 NOTE — Telephone Encounter (Signed)
Naproxen Last filled:  02/23/20, #180 Last OV:  04/11/20, AWV prt 2 Next OV:  10/13/20, 6 mo f/u

## 2020-05-24 ENCOUNTER — Other Ambulatory Visit (HOSPITAL_COMMUNITY)
Admission: RE | Admit: 2020-05-24 | Discharge: 2020-05-24 | Disposition: A | Discharge status: Home / Self Care | Payer: Medicare Other | Source: Ambulatory Visit | Attending: Urology | Admitting: Urology

## 2020-05-24 DIAGNOSIS — Z20822 Contact with and (suspected) exposure to covid-19: Secondary | ICD-10-CM | POA: Insufficient documentation

## 2020-05-24 DIAGNOSIS — Z01812 Encounter for preprocedural laboratory examination: Secondary | ICD-10-CM | POA: Insufficient documentation

## 2020-05-24 LAB — SARS CORONAVIRUS 2 (TAT 6-24 HRS): SARS Coronavirus 2: NEGATIVE

## 2020-05-28 ENCOUNTER — Ambulatory Visit (HOSPITAL_BASED_OUTPATIENT_CLINIC_OR_DEPARTMENT_OTHER): Payer: Medicare Other | Admitting: Anesthesiology

## 2020-05-28 ENCOUNTER — Encounter (HOSPITAL_BASED_OUTPATIENT_CLINIC_OR_DEPARTMENT_OTHER): Payer: Self-pay | Admitting: Urology

## 2020-05-28 ENCOUNTER — Ambulatory Visit (HOSPITAL_BASED_OUTPATIENT_CLINIC_OR_DEPARTMENT_OTHER)
Admission: RE | Admit: 2020-05-28 | Discharge: 2020-05-28 | Disposition: A | Discharge status: Home / Self Care | Payer: Medicare Other | Attending: Urology | Admitting: Urology

## 2020-05-28 ENCOUNTER — Encounter (HOSPITAL_BASED_OUTPATIENT_CLINIC_OR_DEPARTMENT_OTHER)
Admission: RE | Disposition: A | Discharge status: Home / Self Care | Payer: Self-pay | Source: Home / Self Care | Attending: Urology

## 2020-05-28 DIAGNOSIS — Z79899 Other long term (current) drug therapy: Secondary | ICD-10-CM | POA: Insufficient documentation

## 2020-05-28 DIAGNOSIS — G4733 Obstructive sleep apnea (adult) (pediatric): Secondary | ICD-10-CM | POA: Diagnosis not present

## 2020-05-28 DIAGNOSIS — Z791 Long term (current) use of non-steroidal anti-inflammatories (NSAID): Secondary | ICD-10-CM | POA: Insufficient documentation

## 2020-05-28 DIAGNOSIS — G4700 Insomnia, unspecified: Secondary | ICD-10-CM | POA: Diagnosis not present

## 2020-05-28 DIAGNOSIS — K219 Gastro-esophageal reflux disease without esophagitis: Secondary | ICD-10-CM | POA: Diagnosis not present

## 2020-05-28 DIAGNOSIS — I700 Atherosclerosis of aorta: Secondary | ICD-10-CM | POA: Diagnosis not present

## 2020-05-28 DIAGNOSIS — Z981 Arthrodesis status: Secondary | ICD-10-CM | POA: Diagnosis not present

## 2020-05-28 DIAGNOSIS — N200 Calculus of kidney: Secondary | ICD-10-CM | POA: Insufficient documentation

## 2020-05-28 DIAGNOSIS — Z7984 Long term (current) use of oral hypoglycemic drugs: Secondary | ICD-10-CM | POA: Diagnosis not present

## 2020-05-28 DIAGNOSIS — Z87442 Personal history of urinary calculi: Secondary | ICD-10-CM | POA: Insufficient documentation

## 2020-05-28 DIAGNOSIS — I10 Essential (primary) hypertension: Secondary | ICD-10-CM | POA: Insufficient documentation

## 2020-05-28 DIAGNOSIS — N201 Calculus of ureter: Secondary | ICD-10-CM | POA: Diagnosis not present

## 2020-05-28 DIAGNOSIS — E785 Hyperlipidemia, unspecified: Secondary | ICD-10-CM | POA: Diagnosis not present

## 2020-05-28 DIAGNOSIS — Z6839 Body mass index (BMI) 39.0-39.9, adult: Secondary | ICD-10-CM | POA: Insufficient documentation

## 2020-05-28 HISTORY — PX: CYSTOSCOPY/URETEROSCOPY/HOLMIUM LASER/STENT PLACEMENT: SHX6546

## 2020-05-28 HISTORY — DX: Type 2 diabetes mellitus without complications: E11.9

## 2020-05-28 HISTORY — DX: Personal history of urinary calculi: Z87.442

## 2020-05-28 HISTORY — DX: Spontaneous ecchymoses: R23.3

## 2020-05-28 LAB — POCT I-STAT, CHEM 8
BUN: 20 mg/dL (ref 8–23)
Calcium, Ion: 1.21 mmol/L (ref 1.15–1.40)
Chloride: 99 mmol/L (ref 98–111)
Creatinine, Ser: 1 mg/dL (ref 0.61–1.24)
Glucose, Bld: 131 mg/dL — ABNORMAL HIGH (ref 70–99)
HCT: 41 % (ref 39.0–52.0)
Hemoglobin: 13.9 g/dL (ref 13.0–17.0)
Potassium: 3.8 mmol/L (ref 3.5–5.1)
Sodium: 139 mmol/L (ref 135–145)
TCO2: 25 mmol/L (ref 22–32)

## 2020-05-28 LAB — GLUCOSE, CAPILLARY: Glucose-Capillary: 135 mg/dL — ABNORMAL HIGH (ref 70–99)

## 2020-05-28 SURGERY — CYSTOSCOPY/URETEROSCOPY/HOLMIUM LASER/STENT PLACEMENT
Anesthesia: General | Laterality: Right

## 2020-05-28 MED ORDER — LIDOCAINE 2% (20 MG/ML) 5 ML SYRINGE
INTRAMUSCULAR | Status: AC
  Filled 2020-05-28: qty 5

## 2020-05-28 MED ORDER — DEXAMETHASONE SODIUM PHOSPHATE 10 MG/ML IJ SOLN
INTRAMUSCULAR | Status: DC | PRN
  Administered 2020-05-28: 10 mg via INTRAVENOUS

## 2020-05-28 MED ORDER — PROPOFOL 500 MG/50ML IV EMUL
INTRAVENOUS | Status: DC | PRN
  Administered 2020-05-28: 200 mg via INTRAVENOUS

## 2020-05-28 MED ORDER — FENTANYL CITRATE (PF) 100 MCG/2ML IJ SOLN
INTRAMUSCULAR | Status: DC | PRN
Start: 2020-05-28 — End: 2020-05-28
  Administered 2020-05-28 (×2): 25 ug via INTRAVENOUS

## 2020-05-28 MED ORDER — CEFAZOLIN SODIUM-DEXTROSE 2-4 GM/100ML-% IV SOLN
2.0000 g | INTRAVENOUS | Status: AC
  Administered 2020-05-28: 2 g via INTRAVENOUS

## 2020-05-28 MED ORDER — DEXAMETHASONE SODIUM PHOSPHATE 10 MG/ML IJ SOLN
INTRAMUSCULAR | Status: AC
  Filled 2020-05-28: qty 1

## 2020-05-28 MED ORDER — MIDAZOLAM HCL 2 MG/2ML IJ SOLN
INTRAMUSCULAR | Status: DC | PRN
  Administered 2020-05-28: 2 mg via INTRAVENOUS

## 2020-05-28 MED ORDER — FENTANYL CITRATE (PF) 100 MCG/2ML IJ SOLN
INTRAMUSCULAR | Status: AC
  Filled 2020-05-28: qty 2

## 2020-05-28 MED ORDER — ONDANSETRON HCL 4 MG/2ML IJ SOLN
INTRAMUSCULAR | Status: AC
  Filled 2020-05-28: qty 2

## 2020-05-28 MED ORDER — LACTATED RINGERS IV SOLN: INTRAVENOUS | Status: DC

## 2020-05-28 MED ORDER — IOHEXOL 300 MG/ML  SOLN
INTRAMUSCULAR | Status: DC | PRN
  Administered 2020-05-28: 10 mL via URETHRAL

## 2020-05-28 MED ORDER — FENTANYL CITRATE (PF) 100 MCG/2ML IJ SOLN: 25.0000 ug | INTRAMUSCULAR | Status: DC | PRN

## 2020-05-28 MED ORDER — PROPOFOL 10 MG/ML IV BOLUS
INTRAVENOUS | Status: AC
  Filled 2020-05-28: qty 20

## 2020-05-28 MED ORDER — MIDAZOLAM HCL 2 MG/2ML IJ SOLN
INTRAMUSCULAR | Status: AC
  Filled 2020-05-28: qty 2

## 2020-05-28 MED ORDER — KETOROLAC TROMETHAMINE 30 MG/ML IJ SOLN: 30.0000 mg | Freq: Once | INTRAMUSCULAR | Status: DC | PRN

## 2020-05-28 MED ORDER — CEFAZOLIN SODIUM-DEXTROSE 2-4 GM/100ML-% IV SOLN
INTRAVENOUS | Status: AC
  Filled 2020-05-28: qty 100

## 2020-05-28 MED ORDER — KETOROLAC TROMETHAMINE 30 MG/ML IJ SOLN
INTRAMUSCULAR | Status: DC | PRN
  Administered 2020-05-28: 30 mg via INTRAVENOUS

## 2020-05-28 MED ORDER — LIDOCAINE 2% (20 MG/ML) 5 ML SYRINGE
INTRAMUSCULAR | Status: DC | PRN
  Administered 2020-05-28: 80 mg via INTRAVENOUS

## 2020-05-28 MED ORDER — ONDANSETRON HCL 4 MG/2ML IJ SOLN
INTRAMUSCULAR | Status: DC | PRN
  Administered 2020-05-28: 4 mg via INTRAVENOUS

## 2020-05-28 MED ORDER — SODIUM CHLORIDE 0.9 % IR SOLN
Status: DC | PRN
  Administered 2020-05-28: 3000 mL via INTRAVESICAL

## 2020-05-28 MED ORDER — EPHEDRINE SULFATE-NACL 50-0.9 MG/10ML-% IV SOSY
PREFILLED_SYRINGE | INTRAVENOUS | Status: DC | PRN
  Administered 2020-05-28: 10 mg via INTRAVENOUS

## 2020-05-28 SURGICAL SUPPLY — 21 items
BAG DRAIN URO-CYSTO SKYTR STRL (DRAIN) ×3 IMPLANT
BAG DRN UROCATH (DRAIN) ×1
BASKET ZERO TIP NITINOL 2.4FR (BASKET) ×3 IMPLANT
BSKT STON RTRVL ZERO TP 2.4FR (BASKET) ×1
CATH INTERMIT  6FR 70CM (CATHETERS) ×3 IMPLANT
CLOTH BEACON ORANGE TIMEOUT ST (SAFETY) ×3 IMPLANT
FIBER LASER FLEXIVA 365 (UROLOGICAL SUPPLIES) IMPLANT
FIBER LASER TRAC TIP (UROLOGICAL SUPPLIES) ×3 IMPLANT
GLOVE BIO SURGEON STRL SZ7.5 (GLOVE) ×3 IMPLANT
GOWN STRL REUS W/TWL XL LVL3 (GOWN DISPOSABLE) ×3 IMPLANT
GUIDEWIRE STR DUAL SENSOR (WIRE) ×6 IMPLANT
IV NS IRRIG 3000ML ARTHROMATIC (IV SOLUTION) ×6 IMPLANT
KIT TURNOVER CYSTO (KITS) ×3 IMPLANT
MANIFOLD NEPTUNE II (INSTRUMENTS) ×3 IMPLANT
NS IRRIG 500ML POUR BTL (IV SOLUTION) ×3 IMPLANT
PACK CYSTO (CUSTOM PROCEDURE TRAY) ×3 IMPLANT
SHEATH URET ACCESS 12FR/35CM (UROLOGICAL SUPPLIES) ×3 IMPLANT
STENT URET 6FRX24 CONTOUR (STENTS) ×3 IMPLANT
TUBE CONNECTING 12'X1/4 (SUCTIONS) ×1
TUBE CONNECTING 12X1/4 (SUCTIONS) ×2 IMPLANT
TUBING UROLOGY SET (TUBING) ×3 IMPLANT

## 2020-05-28 NOTE — H&P (Signed)
CC: I have had kidney stone surgery.  HPI: Roger Mcdaniel is a 69 year-old male established patient who is here for renal calculi after a surgical intervention.  04/01/2020: CT imaging performed prior to shockwave lithotripsy indicated bilateral simple appearing cyst as well as a large right UPJ calculi thought to be the source of his hematuria and intermittent pain/discomfort. He underwent shockwave lithotripsy on 06/21 and returns today for follow-up exam. In the interval he has passed multiple stone pieces without any significant discomfort but still is having some occasional burning with urination rated as not severe by the patient. He has not required the use of any type of pain medication since his procedure. He continues tamsulosin and is tolerating well. Denies any bothersome increase in frequency/urgency or changes in force of stream from baseline. No interval fevers or chills, nausea/vomiting.   05/05/2020: Returns today for follow-up visit. KUB at last time showed the large UPJ calculus had fragmented with patient passing stone material in the interval. Stone fragments he brought in were assessed and noted to be of calcium oxalate composition. KUB at that time did show some retained pieces within the renal pelvis and UPJ area but patient was not having any significantly bothersome symptomatology. He continues tamsulosin.   Today denies any interval stone material passage. He is still having some occasional burning with urination but not severe by his report. No interval passage of blood in the urine. Otherwise voiding at his baseline. He has had some mild interval recurrence of right-sided pain and discomfort but this has been transient and not requiring the use of any type of pain medication. He denies any interval fevers or chills, nausea/vomiting.   The stone was on the right side. He had ESWL for treatment of his renal calculi. Patient denies Stent, Ureteroscopy, and PCNL. This procedure was  done 03/17/2020. He did pass a stone since the last office visit . This is not his first kidney stone. He does not have a stent in place.   He is not currently having flank pain, back pain, groin pain, nausea, vomiting, fever or chills.   He does have dysuria. He does not have urgency. He does not have frequency.     ALLERGIES: Codeine - Vomiting, Nausea    MEDICATIONS: Lipitor 40 mg tablet  Metformin Hcl 500 mg tablet  Tamsulosin Hcl 0.4 mg capsule  Cefdinir 300 mg capsule  Hydrocodone-Acetaminophen 5 mg-325 mg tablet  Lisinopril-Hydrochlorothiazide 20 mg-25 mg tablet  Magnesium 400 mg magnesium tablet  Melatonin 10 mg tablet  Naproxen 500 mg tablet  Vitamin C 500 mg tablet  Zinc Gluconate     GU PSH: ESWL, Right - 03/17/2020     NON-GU PSH: Ankle Arthroscopy/surgery, Right - about 1979 Hand/finger Surgery, Right - about 02/26/2019 Neck Surgery - about 2015 Nose Surgery (Unspecified) - about 1980 Shoulder Arthroscopy/surgery, Right - about 2018     GU PMH: Gross hematuria - 03/11/2020 Renal calculus - 03/11/2020 Renal colic - 03/11/2020    NON-GU PMH: Hypercholesterolemia Hypertension    FAMILY HISTORY: 2 sons - Son Benign essential hypertension - Brother COPD (chronic obstructive pulmonary disease) with - Mother Death In The Family Father - Father Death In The Family Mother - Mother Heart Attack - Father Lung Cancer - Mother   SOCIAL HISTORY: Marital Status: Widowed Current Smoking Status: Patient has never smoked.   Tobacco Use Assessment Completed: Used Tobacco in last 30 days? Drinks 2 drinks per week.  Drinks 1 caffeinated drink per day.  Patient's occupation is/was Retired.    REVIEW OF SYSTEMS:    GU Review Male:   Patient denies frequent urination, hard to postpone urination, burning/ pain with urination, get up at night to urinate, leakage of urine, stream starts and stops, trouble starting your stream, have to strain to urinate , erection problems, and  penile pain.  Gastrointestinal (Upper):   Patient denies nausea, vomiting, and indigestion/ heartburn.  Gastrointestinal (Lower):   Patient denies diarrhea and constipation.  Constitutional:   Patient denies fever, night sweats, weight loss, and fatigue.  Skin:   Patient denies skin rash/ lesion and itching.  Eyes:   Patient denies blurred vision and double vision.  Ears/ Nose/ Throat:   Patient denies sore throat and sinus problems.  Hematologic/Lymphatic:   Patient denies swollen glands and easy bruising.  Cardiovascular:   Patient denies leg swelling and chest pains.  Respiratory:   Patient denies cough and shortness of breath.  Endocrine:   Patient denies excessive thirst.  Musculoskeletal:   Patient denies back pain and joint pain.  Neurological:   Patient denies headaches and dizziness.  Psychologic:   Patient denies anxiety and depression.   VITAL SIGNS:      05/05/2020 08:44 AM  Weight 230 lb / 104.33 kg  Height 64 in / 162.56 cm  BP 138/68 mmHg  Pulse 70 /min  Temperature 97.5 F / 36.3 C  BMI 39.5 kg/m   MULTI-SYSTEM PHYSICAL EXAMINATION:    Constitutional: Well-nourished. No physical deformities. Normally developed. Good grooming.  Neck: Neck symmetrical, not swollen. Normal tracheal position.  Respiratory: No labored breathing, no use of accessory muscles.   Cardiovascular: Normal temperature, normal extremity pulses, no swelling, no varicosities.  Skin: No paleness, no jaundice, no cyanosis. No lesion, no ulcer, no rash.  Neurologic / Psychiatric: Oriented to time, oriented to place, oriented to person. No depression, no anxiety, no agitation.  Gastrointestinal: Obese abdomen. No mass, no tenderness, no rigidity. Mild palpable discomfort over the right CVA and flank area. No overt palpable tenderness.   Musculoskeletal: Normal gait and station of head and neck.     Complexity of Data:  Source Of History:  Patient, Medical Record Summary  Lab Test Review:   Stone  Analysis  Records Review:   Previous Doctor Records, Previous Hospital Records, Previous Patient Records  Urine Test Review:   Urinalysis  X-Ray Review: KUB: Reviewed Films. Discussed With Patient.     PROCEDURES:         KUB - [id number]  A single view of the abdomen is obtained. KUB today slightly improved compared to last office visit. Four distinct opacities consistent with right-sided nephrolithiasis visualized on today's exam. 3 stones noted in the right UPJ area and one noted in the mid-pole area consistent with a renal pelvis stone. No other obvious right ureteral calculi identified tracing down the ureters anatomical expected tract. He has stable pelvic phlebolith as well as prostate calcifications. No obvious bladder calculi noted on today's exam. There is some overlying but nonobstructing bowel gas and stool pattern that does decreased sensitivity of today's imaging study somewhat.      . Patient confirmed No Neulasta OnPro Device.           Urinalysis w/Scope Dipstick Dipstick Cont'd Micro  Color: Yellow Bilirubin: Neg mg/dL WBC/hpf: 10 - 99/IPJ  Appearance: Clear Ketones: Neg mg/dL RBC/hpf: 0 - 2/hpf  Specific Gravity: 1.025 Blood: Neg ery/uL Bacteria: Few (10-25/hpf)  pH: <=5.0 Protein: Trace mg/dL Cystals: NS (Not  Seen)  Glucose: Neg mg/dL Urobilinogen: 0.2 mg/dL Casts: Hyaline    Nitrites: Neg Trichomonas: Not Present    Leukocyte Esterase: 1+ leu/uL Mucous: Present      Epithelial Cells: 0 - 5/hpf      Yeast: NS (Not Seen)      Sperm: Not Present    ASSESSMENT:      ICD-10 Details  1 GU:   Renal calculus - N20.0 Right, Chronic, Improving   PLAN:           Orders Labs Urine Culture          Schedule Return Visit/Planned Activity: Other See Visit Notes - Follow up MD, F/U Pending Results, Schedule Surgery          Document Letter(s):  Created for Patient: Clinical Summary         Notes:   No interval stone material passage. He remains minimally symptomatic  despite continuing tamsulosin. KUB today does show persistence of at least 3 stones within the right UPJ area as well as in the right renal pelvis. No obvious ureteral calculi noted on today's exam. Discussed likelihood of repeat procedure in the form of ureteroscopy in order to render him stone free on the affected side to prevent him from becoming more symptomatic from obstructive uropathy or developing an infection or worsening renal function.   For observation I described the risks which include but are not limited to silent renal damage, life-threatening infection, need for emergent surgery, failure to pass stone, and pain.   For ureteroscopy I described the risks which include heart attack, stroke, pulmonary embolus, death, bleeding, infection, damage to contiguous structures, positioning injury, ureteral stricture, ureteral avulsion, ureteral injury, need for ureteral stent, inability to perform ureteroscopy, need for an interval procedure, inability to clear stone burden, stent discomfort and pain.   I will discuss today's presentation with Dr. Alvester Morin and then have the patient contacted regarding recommended plan of care moving forward. He will continue to monitor for interval stone material passage. He will continue tamsulosin. Pain management discussed in the event he develops increased right-sided pain/discomfort. Management of increased bothersome lower urinary tract symptoms he also discussed. In the event he passes some stone material he will contact the office to be scheduled for repeat evaluation. Also return to clinic instructions discussed for new or worsening symptomatology. Once I discussed with his urologist, the patient will be contacted regarding recommended plan of care moving forward.   Signed by Anne Fu, NP on 05/05/20 at 12:22 PM (EDT

## 2020-05-28 NOTE — Anesthesia Preprocedure Evaluation (Signed)
Anesthesia Evaluation  Patient identified by MRN, date of birth, ID band Patient awake    Reviewed: Allergy & Precautions, NPO status , Patient's Chart, lab work & pertinent test results  Airway Mallampati: II  TM Distance: >3 FB Neck ROM: Full    Dental no notable dental hx.    Pulmonary sleep apnea ,    Pulmonary exam normal breath sounds clear to auscultation       Cardiovascular hypertension, Normal cardiovascular exam Rhythm:Regular Rate:Normal     Neuro/Psych negative neurological ROS  negative psych ROS   GI/Hepatic Neg liver ROS, GERD  ,  Endo/Other  diabetesMorbid obesity  Renal/GU negative Renal ROS  negative genitourinary   Musculoskeletal negative musculoskeletal ROS (+)   Abdominal   Peds negative pediatric ROS (+)  Hematology negative hematology ROS (+)   Anesthesia Other Findings   Reproductive/Obstetrics negative OB ROS                             Anesthesia Physical Anesthesia Plan  ASA: III  Anesthesia Plan: General   Post-op Pain Management:    Induction: Intravenous  PONV Risk Score and Plan: 2 and Ondansetron, Dexamethasone and Treatment may vary due to age or medical condition  Airway Management Planned: LMA  Additional Equipment:   Intra-op Plan:   Post-operative Plan: Extubation in OR  Informed Consent: I have reviewed the patients History and Physical, chart, labs and discussed the procedure including the risks, benefits and alternatives for the proposed anesthesia with the patient or authorized representative who has indicated his/her understanding and acceptance.     Dental advisory given  Plan Discussed with: CRNA and Surgeon  Anesthesia Plan Comments:         Anesthesia Quick Evaluation

## 2020-05-28 NOTE — Op Note (Signed)
Operative Note  Preoperative diagnosis:  1.  Right renal calculi  Postoperative diagnosis: 1.  Right renal calculi  Procedure(s): 1.  Cystoscopy with right retrograde pyelogram, right ureteroscopy with laser lithotripsy, stone basketing, ureteral stent placement  Surgeon: Modena Slater, MD  Assistants: None  Anesthesia: General  Complications: None immediate  EBL: Minimal  Specimens: 1.  Renal calculus  Drains/Catheters: 1.  6 x 24 double-J ureteral stent  Intraoperative findings: 1.  Normal urethra and bladder 2.  Retrograde pyelogram revealed fullness in the renal pelvis.  No obvious filling defects.  Ureteroscopy revealed multiple small stone fragments consistent with prior ESWL.  These were fragmented to smaller fragments and basket extracted until no clinically significant stone fragments remained  Indication: 69 year old male with a large right renal pelvis stone status post ESWL with residual fragments presents for the previously mentioned operation.  Description of procedure:  The patient was identified and consent was obtained.  The patient was taken to the operating room and placed in the supine position.  The patient was placed under general anesthesia.  Perioperative antibiotics were administered.  The patient was placed in dorsal lithotomy.  Patient was prepped and draped in a standard sterile fashion and a timeout was performed.  A 21 French rigid cystoscope was advanced into the urethra and into the bladder.  Complete cystoscopy was performed with no abnormal findings.  The right ureter was cannulated with a sensor wire which was advanced up to the kidney under fluoroscopic guidance.  A semirigid ureteroscope was advanced alongside the wire up to the renal pelvis.  No ureteral calculi were seen.  A second sensor wire was advanced through the scope and into the kidney.  The scope was withdrawn.  A 12 x 14 ureteral access sheath was advanced over 1 the wires under  continuous fluoroscopic guidance and it passed easily up the ureter.  The inner sheath along with the wire were withdrawn.  Digital ureteroscopy was performed.  Multiple stone fragments were encountered and were laser fragmented to smaller fragments followed by basket extraction.  Once all stone fragments were extracted, I performed a complete pyeloscopy and did not identify any other clinically significant stone fragments.  I shot a retrograde pyelogram through the scope with the findings noted above.  I then withdrew the scope along with the access sheath.  There was no obvious ureteral injury or ureteral calculi.  I backloaded the wire onto a rigid cystoscope and advanced that into the bladder followed by routine placement of a 6 x 24 double-J ureteral stent.  Fluoroscopy confirmed proximal placement and direct visualization confirmed a good coil within the bladder.  I drained the bladder and withdrew the scope.  Patient tolerated procedure well was stable postoperative.  Plan: Follow-up in 1 week for stent removal

## 2020-05-28 NOTE — Interval H&P Note (Signed)
History and Physical Interval Note:  05/28/2020 9:49 AM  Roger Mcdaniel  has presented today for surgery, with the diagnosis of RIGHT URETERAL PELVIC JUNCTION STONE.  The various methods of treatment have been discussed with the patient and family. After consideration of risks, benefits and other options for treatment, the patient has consented to  Procedure(s): CYSTOSCOPY RIGHT URETEROSCOPY/HOLMIUM LASER/STENT PLACEMENT (Right) as a surgical intervention.  The patient's history has been reviewed, patient examined, no change in status, stable for surgery.  I have reviewed the patient's chart and labs.  Questions were answered to the patient's satisfaction.     Ray Church, III

## 2020-05-28 NOTE — Anesthesia Postprocedure Evaluation (Signed)
Anesthesia Post Note  Patient: Roger Mcdaniel  Procedure(s) Performed: CYSTOSCOPY RIGHT URETEROSCOPY/HOLMIUM LASER/STENT PLACEMENT STONE BASKET RETRACTION (Right )     Patient location during evaluation: PACU Anesthesia Type: General Level of consciousness: awake and alert Pain management: pain level controlled Vital Signs Assessment: post-procedure vital signs reviewed and stable Respiratory status: spontaneous breathing, nonlabored ventilation, respiratory function stable and patient connected to nasal cannula oxygen Cardiovascular status: blood pressure returned to baseline and stable Postop Assessment: no apparent nausea or vomiting Anesthetic complications: no   No complications documented.  Last Vitals:  Vitals:   05/28/20 1130 05/28/20 1145  BP: 135/62 (!) 137/54  Pulse: 66 64  Resp: 18 16  Temp:  36.5 C  SpO2: 99% 97%    Last Pain:  Vitals:   05/28/20 1145  TempSrc:   PainSc: 1                  Bernardino Dowell S

## 2020-05-28 NOTE — Discharge Instructions (Addendum)
[facility] [phone] Post Ureteroscopy With or Without Stent Instructions  Definitions:  Ureter: The duct that transports urine from the kidney to the bladder. Stent:   A plastic hollow tube that is placed into the ureter, from the kidney to the                 bladder to prevent the ureter from swelling shut.  GENERAL INSTRUCTIONS:  Despite the fact that no skin incisions were used, the area around the ureter and bladder is raw and irritated. The stent is a foreign body which will further irritate the bladder wall. This irritation is manifested by increased frequency of urination, both day and night, and by an increase in the urge to urinate. In some, the urge to urinate is present almost always. Sometimes the urge is strong enough that you may not be able to stop yourself from urinating. The only real cure is to remove the stent and then give time for the bladder wall to heal which can't be done until the danger of the ureter swelling shut has passed, which varies.  You may see some blood in your urine while the stent is in place and a few days afterwards. Do not be alarmed, even if the urine was clear for a while. Get off your feet and drink lots of fluids until clearing occurs. If you start to pass clots or don't improve, call us.  DIET: You may return to your normal diet immediately. Because of the raw surface of your bladder, alcohol, spicy foods, acid type foods and drinks with caffeine may cause irritation or frequency and should be used in moderation. To keep your urine flowing freely and to avoid constipation, drink plenty of fluids during the day ( 8-10 glasses ). Tip: Avoid cranberry juice because it is very acidic.  ACTIVITY: Your physical activity doesn't need to be restricted. However, if you are very active, you may see some blood in your urine. We suggest that you reduce your activity under these circumstances until the bleeding has stopped.  BOWELS: It is  important to keep your bowels regular during the postoperative period. Straining with bowel movements can cause bleeding. A bowel movement every other day is reasonable. Use a mild laxative if needed, such as Milk of Magnesia 2-3 tablespoons, or 2 Dulcolax tablets. Call if you continue to have problems. If you have been taking narcotics for pain, before, during or after your surgery, you may be constipated. Take a laxative if necessary.   MEDICATION: You should resume your pre-surgery medications unless told not to. You may take oxybutynin or flomax if prescribed for bladder spasms or discomfort from the stent Take pain medication as directed for pain refractory to conservative management  PROBLEMS YOU SHOULD REPORT TO US: Fevers over 100.5 Fahrenheit. Heavy bleeding, or clots ( See above notes about blood in urine ). Inability to urinate. Drug reactions ( hives, rash, nausea, vomiting, diarrhea ). Severe burning or pain with urination that is not improving.   Post Anesthesia Home Care Instructions  Activity: Get plenty of rest for the remainder of the day. A responsible individual must stay with you for 24 hours following the procedure.  For the next 24 hours, DO NOT: -Drive a car -Operate machinery -Drink alcoholic beverages -Take any medication unless instructed by your physician -Make any legal decisions or sign important papers.  Meals: Start with liquid foods such as gelatin or soup. Progress to regular foods as tolerated. Avoid greasy, spicy,   as tolerated. Avoid greasy, spicy, heavy foods. If nausea and/or vomiting occur, drink only clear liquids until the nausea and/or vomiting subsides. Call your physician if vomiting continues.  Special Instructions/Symptoms: Your throat may feel dry or sore from the anesthesia or the breathing tube placed in your throat during surgery. If this causes discomfort, gargle with warm salt water. The discomfort should disappear within 24 hours.  No  ibuprofen/Motrin/Advil products until 5pm 05/28/20

## 2020-05-28 NOTE — Anesthesia Procedure Notes (Signed)
Procedure Name: LMA Insertion Date/Time: 05/28/2020 9:58 AM Performed by: Basilio Cairo, CRNA Pre-anesthesia Checklist: Patient identified, Patient being monitored, Timeout performed, Emergency Drugs available and Suction available Patient Re-evaluated:Patient Re-evaluated prior to induction Oxygen Delivery Method: Circle system utilized Preoxygenation: Pre-oxygenation with 100% oxygen Induction Type: IV induction Ventilation: Mask ventilation without difficulty LMA: LMA inserted LMA Size: 5.0 Tube type: Oral Number of attempts: 1 Placement Confirmation: positive ETCO2 and breath sounds checked- equal and bilateral Tube secured with: Tape Dental Injury: Teeth and Oropharynx as per pre-operative assessment

## 2020-05-28 NOTE — Transfer of Care (Signed)
Immediate Anesthesia Transfer of Care Note  Patient: Roger Mcdaniel  Procedure(s) Performed: Procedure(s): CYSTOSCOPY RIGHT URETEROSCOPY/HOLMIUM LASER/STENT PLACEMENT STONE BASKET RETRACTION (Right)  Patient Location: PACU  Anesthesia Type:General  Level of Consciousness: Alert, Awake, Oriented  Airway & Oxygen Therapy: Patient Spontanous Breathing  Post-op Assessment: Report given to RN  Post vital signs: Reviewed and stable  Last Vitals:  Vitals:   05/28/20 0855  BP: (!) 144/68  Pulse: 64  Resp: 18  Temp: 36.7 C  SpO2: 99%    Complications: No apparent anesthesia complications

## 2020-05-29 ENCOUNTER — Encounter (HOSPITAL_BASED_OUTPATIENT_CLINIC_OR_DEPARTMENT_OTHER): Payer: Self-pay | Admitting: Urology

## 2020-06-06 DIAGNOSIS — N200 Calculus of kidney: Secondary | ICD-10-CM | POA: Diagnosis not present

## 2020-06-16 DIAGNOSIS — M5030 Other cervical disc degeneration, unspecified cervical region: Secondary | ICD-10-CM | POA: Diagnosis not present

## 2020-06-16 DIAGNOSIS — M4722 Other spondylosis with radiculopathy, cervical region: Secondary | ICD-10-CM | POA: Diagnosis not present

## 2020-06-16 DIAGNOSIS — M542 Cervicalgia: Secondary | ICD-10-CM | POA: Diagnosis not present

## 2020-06-22 DIAGNOSIS — Z23 Encounter for immunization: Secondary | ICD-10-CM | POA: Diagnosis not present

## 2020-06-23 ENCOUNTER — Encounter: Payer: Self-pay | Admitting: Family Medicine

## 2020-06-24 NOTE — Telephone Encounter (Signed)
Updated pt's immunizations.  Requested DM eye exam report from [facility].

## 2020-06-25 ENCOUNTER — Encounter: Payer: Self-pay | Admitting: Family Medicine

## 2020-07-15 DIAGNOSIS — N281 Cyst of kidney, acquired: Secondary | ICD-10-CM | POA: Diagnosis not present

## 2020-07-15 DIAGNOSIS — N200 Calculus of kidney: Secondary | ICD-10-CM | POA: Diagnosis not present

## 2020-07-15 DIAGNOSIS — R351 Nocturia: Secondary | ICD-10-CM | POA: Diagnosis not present

## 2020-07-22 DIAGNOSIS — M47812 Spondylosis without myelopathy or radiculopathy, cervical region: Secondary | ICD-10-CM | POA: Diagnosis not present

## 2020-07-22 DIAGNOSIS — M5030 Other cervical disc degeneration, unspecified cervical region: Secondary | ICD-10-CM | POA: Diagnosis not present

## 2020-07-23 DIAGNOSIS — M4722 Other spondylosis with radiculopathy, cervical region: Secondary | ICD-10-CM | POA: Diagnosis not present

## 2020-07-23 DIAGNOSIS — I10 Essential (primary) hypertension: Secondary | ICD-10-CM | POA: Diagnosis not present

## 2020-07-23 DIAGNOSIS — Z6841 Body Mass Index (BMI) 40.0 and over, adult: Secondary | ICD-10-CM | POA: Diagnosis not present

## 2020-07-23 DIAGNOSIS — M47812 Spondylosis without myelopathy or radiculopathy, cervical region: Secondary | ICD-10-CM | POA: Diagnosis not present

## 2020-07-30 ENCOUNTER — Encounter: Payer: Self-pay | Admitting: Family Medicine

## 2020-07-30 DIAGNOSIS — M542 Cervicalgia: Secondary | ICD-10-CM | POA: Insufficient documentation

## 2020-08-03 ENCOUNTER — Encounter: Payer: Self-pay | Admitting: Family Medicine

## 2020-08-04 DIAGNOSIS — M5412 Radiculopathy, cervical region: Secondary | ICD-10-CM | POA: Diagnosis not present

## 2020-08-04 DIAGNOSIS — M4802 Spinal stenosis, cervical region: Secondary | ICD-10-CM | POA: Diagnosis not present

## 2020-08-08 ENCOUNTER — Other Ambulatory Visit: Payer: Self-pay | Admitting: Family Medicine

## 2020-08-08 NOTE — Telephone Encounter (Signed)
Naproxen Last refill:  05/23/20, #180 Last OV:  04/11/20, AWV prt 2 Next OV:  10/13/20, 6 mo f/u

## 2020-09-08 ENCOUNTER — Other Ambulatory Visit: Payer: Self-pay | Admitting: Family Medicine

## 2020-10-13 ENCOUNTER — Ambulatory Visit: Payer: Medicare Other | Admitting: Family Medicine

## 2020-10-27 DIAGNOSIS — Z6841 Body Mass Index (BMI) 40.0 and over, adult: Secondary | ICD-10-CM | POA: Diagnosis not present

## 2020-10-27 DIAGNOSIS — M1711 Unilateral primary osteoarthritis, right knee: Secondary | ICD-10-CM | POA: Diagnosis not present

## 2020-10-29 ENCOUNTER — Encounter: Payer: Self-pay | Admitting: Family Medicine

## 2020-10-30 ENCOUNTER — Other Ambulatory Visit: Payer: Self-pay | Admitting: Family Medicine

## 2020-10-30 ENCOUNTER — Ambulatory Visit: Payer: Self-pay | Admitting: Student

## 2020-11-01 ENCOUNTER — Other Ambulatory Visit: Payer: Self-pay | Admitting: Family Medicine

## 2020-11-03 NOTE — Telephone Encounter (Signed)
Naproxen Last filled:  08/14/20, #180 Last OV:  04/11/20, AWV prt 2 Next OV:  11/14/20, 6 mo f/u; pre-op eval

## 2020-11-05 NOTE — Telephone Encounter (Signed)
ERx 

## 2020-11-10 ENCOUNTER — Encounter: Payer: Self-pay | Admitting: Family Medicine

## 2020-11-11 DIAGNOSIS — M1711 Unilateral primary osteoarthritis, right knee: Secondary | ICD-10-CM | POA: Diagnosis not present

## 2020-11-11 DIAGNOSIS — M25661 Stiffness of right knee, not elsewhere classified: Secondary | ICD-10-CM | POA: Diagnosis not present

## 2020-11-11 DIAGNOSIS — M25561 Pain in right knee: Secondary | ICD-10-CM | POA: Diagnosis not present

## 2020-11-13 NOTE — Progress Notes (Signed)
DUE TO COVID-19 ONLY ONE VISITOR IS ALLOWED TO COME WITH YOU AND STAY IN THE WAITING ROOM ONLY DURING PRE OP AND PROCEDURE DAY OF SURGERY. THE 1 VISITOR  MAY VISIT WITH YOU AFTER SURGERY IN YOUR PRIVATE ROOM DURING VISITING HOURS ONLY!  YOU NEED TO HAVE A COVID 19 TEST ON_2/28/2022 ____ @_______ , THIS TEST MUST BE DONE BEFORE SURGERY,  COVID TESTING SITE [address] [place] , IT IS ON THE RIGHT GOING OUT [address] APPROXIMATELY  2 MINUTES PAST [employer] ON THE RIGHT. ONCE YOUR COVID TEST IS COMPLETED,  PLEASE BEGIN THE QUARANTINE INSTRUCTIONS AS OUTLINED IN YOUR HANDOUT.                GABE GLACE  11/13/2020   Your procedure is scheduled on: 11/27/2020    Report to [facility] Main  Entrance   Report to admitting at    0820am      Call this number if you have problems the morning of surgery [phone]    REMEMBER: NO  SOLID FOOD CANDY OR GUM AFTER MIDNIGHT. CLEAR LIQUIDS UNTIL 0750am         . NOTHING BY MOUTH EXCEPT CLEAR LIQUIDS UNTIL    . PLEASE FINISH ENSURE DRINK PER SURGEON ORDER  WHICH NEEDS TO BE COMPLETED AT  0750am .      CLEAR LIQUID DIET   Foods Allowed                                                                    Coffee and tea, regular and decaf                            Fruit ices (not with fruit pulp)                                      Iced Popsicles                                    Carbonated beverages, regular and diet                                    Cranberry, grape and apple juices Sports drinks like Gatorade Lightly seasoned clear broth or consume(fat free) Sugar, honey syrup ___________________________________________________________________      BRUSH YOUR TEETH MORNING OF SURGERY AND RINSE YOUR MOUTH OUT, NO CHEWING GUM CANDY OR MINTS.     Take these medicines the morning of surgery with A SIP OF WATER: Gabapentin, flomax   DO NOT TAKE ANY DIABETIC MEDICATIONS DAY OF YOUR SURGERY                                You may not have any metal on your body including hair pins and              piercings  Do not wear jewelry, make-up,  lotions, powders or perfumes, deodorant             Do not wear nail polish on your fingernails.  Do not shave  48 hours prior to surgery.              Men may shave face and neck.   Do not bring valuables to the hospital. [place].  Contacts, dentures or bridgework may not be worn into surgery.  Leave suitcase in the car. After surgery it may be brought to your room.     Patients discharged the day of surgery will not be allowed to drive home. IF YOU ARE HAVING SURGERY AND GOING HOME THE SAME DAY, YOU MUST HAVE AN ADULT TO DRIVE YOU HOME AND BE WITH YOU FOR 24 HOURS. YOU MAY GO HOME BY TAXI OR UBER OR ORTHERWISE, BUT AN ADULT MUST ACCOMPANY YOU HOME AND STAY WITH YOU FOR 24 HOURS.  Name and phone number of your driver:  Special Instructions: N/A              Please read over the following fact sheets you were given: _____________________________________________________________________  [facility] - Preparing for Surgery Before surgery, you can play an important role.  Because skin is not sterile, your skin needs to be as free of germs as possible.  You can reduce the number of germs on your skin by washing with CHG (chlorahexidine gluconate) soap before surgery.  CHG is an antiseptic cleaner which kills germs and bonds with the skin to continue killing germs even after washing. Please DO NOT use if you have an allergy to CHG or antibacterial soaps.  If your skin becomes reddened/irritated stop using the CHG and inform your nurse when you arrive at Short Stay. Do not shave (including legs and underarms) for at least 48 hours prior to the first CHG shower.  You may shave your face/neck. Please follow these instructions carefully:  1.  Shower with CHG Soap the night before surgery and the  morning of  Surgery.  2.  If you choose to wash your hair, wash your hair first as usual with your  normal  shampoo.  3.  After you shampoo, rinse your hair and body thoroughly to remove the  shampoo.                           4.  Use CHG as you would any other liquid soap.  You can apply chg directly  to the skin and wash                       Gently with a scrungie or clean washcloth.  5.  Apply the CHG Soap to your body ONLY FROM THE NECK DOWN.   Do not use on face/ open                           Wound or open sores. Avoid contact with eyes, ears mouth and genitals (private parts).                       Wash face,  Genitals (private parts) with your normal soap.             6.  Wash thoroughly, paying special attention to  the area where your surgery  will be performed.  7.  Thoroughly rinse your body with warm water from the neck down.  8.  DO NOT shower/wash with your normal soap after using and rinsing off  the CHG Soap.                9.  Pat yourself dry with a clean towel.            10.  Wear clean pajamas.            11.  Place clean sheets on your bed the night of your first shower and do not  sleep with pets. Day of Surgery : Do not apply any lotions/deodorants the morning of surgery.  Please wear clean clothes to the hospital/surgery center.  FAILURE TO FOLLOW THESE INSTRUCTIONS MAY RESULT IN THE CANCELLATION OF YOUR SURGERY PATIENT SIGNATURE_________________________________  NURSE SIGNATURE__________________________________  ________________________________________________________________________

## 2020-11-14 ENCOUNTER — Ambulatory Visit (INDEPENDENT_AMBULATORY_CARE_PROVIDER_SITE_OTHER): Payer: Medicare Other | Admitting: Family Medicine

## 2020-11-14 ENCOUNTER — Encounter: Payer: Self-pay | Admitting: Family Medicine

## 2020-11-14 ENCOUNTER — Ambulatory Visit (INDEPENDENT_AMBULATORY_CARE_PROVIDER_SITE_OTHER)
Admission: RE | Admit: 2020-11-14 | Discharge: 2020-11-14 | Disposition: A | Discharge status: Home / Self Care | Payer: Medicare Other | Source: Ambulatory Visit | Attending: Family Medicine | Admitting: Family Medicine

## 2020-11-14 ENCOUNTER — Other Ambulatory Visit: Payer: Self-pay

## 2020-11-14 VITALS — BP 118/62 | HR 67 | Temp 97.5°F | Ht 64.25 in | Wt 222.2 lb

## 2020-11-14 DIAGNOSIS — J9811 Atelectasis: Secondary | ICD-10-CM | POA: Diagnosis not present

## 2020-11-14 DIAGNOSIS — E118 Type 2 diabetes mellitus with unspecified complications: Secondary | ICD-10-CM

## 2020-11-14 DIAGNOSIS — Z01818 Encounter for other preprocedural examination: Secondary | ICD-10-CM

## 2020-11-14 DIAGNOSIS — M79604 Pain in right leg: Secondary | ICD-10-CM | POA: Diagnosis not present

## 2020-11-14 DIAGNOSIS — I10 Essential (primary) hypertension: Secondary | ICD-10-CM | POA: Diagnosis not present

## 2020-11-14 DIAGNOSIS — M1711 Unilateral primary osteoarthritis, right knee: Secondary | ICD-10-CM | POA: Diagnosis not present

## 2020-11-14 DIAGNOSIS — J90 Pleural effusion, not elsewhere classified: Secondary | ICD-10-CM | POA: Diagnosis not present

## 2020-11-14 LAB — POC URINALSYSI DIPSTICK (AUTOMATED)
Bilirubin, UA: NEGATIVE
Blood, UA: NEGATIVE
Glucose, UA: NEGATIVE
Ketones, UA: NEGATIVE
Leukocytes, UA: NEGATIVE
Nitrite, UA: NEGATIVE
Protein, UA: NEGATIVE
Spec Grav, UA: 1.015 (ref 1.010–1.025)
Urobilinogen, UA: 0.2 E.U./dL
pH, UA: 6 (ref 5.0–8.0)

## 2020-11-14 LAB — PROTIME-INR
INR: 1 ratio (ref 0.8–1.0)
Prothrombin Time: 11.1 s (ref 9.6–13.1)

## 2020-11-14 LAB — COMPREHENSIVE METABOLIC PANEL
ALT: 19 U/L (ref 0–53)
AST: 24 U/L (ref 0–37)
Albumin: 4.2 g/dL (ref 3.5–5.2)
Alkaline Phosphatase: 75 U/L (ref 39–117)
BUN: 20 mg/dL (ref 6–23)
CO2: 32 mEq/L (ref 19–32)
Calcium: 9.5 mg/dL (ref 8.4–10.5)
Chloride: 96 mEq/L (ref 96–112)
Creatinine, Ser: 1.23 mg/dL (ref 0.40–1.50)
GFR: 60.36 mL/min (ref >60.00)
Glucose, Bld: 105 mg/dL — ABNORMAL HIGH (ref 70–99)
Potassium: 4.3 mEq/L (ref 3.5–5.1)
Sodium: 134 mEq/L — ABNORMAL LOW (ref 135–145)
Total Bilirubin: 0.8 mg/dL (ref 0.2–1.2)
Total Protein: 7 g/dL (ref 6.0–8.3)

## 2020-11-14 LAB — POCT GLYCOSYLATED HEMOGLOBIN (HGB A1C): Hemoglobin A1C: 6.4 % — AB (ref 4.0–5.6)

## 2020-11-14 LAB — CBC WITH DIFFERENTIAL/PLATELET
Basophils Absolute: 0 10*3/uL (ref 0.0–0.1)
Basophils Relative: 0.6 % (ref 0.0–3.0)
Eosinophils Absolute: 0.3 10*3/uL (ref 0.0–0.7)
Eosinophils Relative: 4.7 % (ref 0.0–5.0)
HCT: 43 % (ref 39.0–52.0)
Hemoglobin: 14.4 g/dL (ref 13.0–17.0)
Lymphocytes Relative: 24.4 % (ref 12.0–46.0)
Lymphs Abs: 1.3 10*3/uL (ref 0.7–4.0)
MCHC: 33.5 g/dL (ref 30.0–36.0)
MCV: 89.1 fl (ref 78.0–100.0)
Monocytes Absolute: 0.7 10*3/uL (ref 0.1–1.0)
Monocytes Relative: 13.7 % — ABNORMAL HIGH (ref 3.0–12.0)
Neutro Abs: 3.1 10*3/uL (ref 1.4–7.7)
Neutrophils Relative %: 56.6 % (ref 43.0–77.0)
Platelets: 277 10*3/uL (ref 150.0–400.0)
RBC: 4.82 Mil/uL (ref 4.22–5.81)
RDW: 13.4 % (ref 11.5–15.5)
WBC: 5.4 10*3/uL (ref 4.0–10.5)

## 2020-11-14 LAB — MAGNESIUM: Magnesium: 2.2 mg/dL (ref 1.5–2.5)

## 2020-11-14 MED ORDER — LISINOPRIL 10 MG PO TABS: 10.0000 mg | ORAL_TABLET | Freq: Every day | ORAL | 1 refills | Status: DC

## 2020-11-14 NOTE — Progress Notes (Addendum)
Patient ID: Roger Mcdaniel, male    DOB: 16-Jun-1951, 70 y.o.   MRN: [id number]  This visit was conducted in person.  BP 118/62   Pulse 67   Temp (!) 97.5 F (36.4 C) (Temporal)   Ht 5' 4.25" (1.632 m)   Wt 222 lb 4 oz (100.8 kg)   SpO2 97%   BMI 37.85 kg/m    CC: preop visit and 6 mo DM f/u  Subjective:   HPI: Roger Mcdaniel is a 70 y.o. male presenting on 11/14/2020 for Diabetes (Here for 6 mo f/u.) and Pre-op Exam (Right TKR on 11/27/20.  Has pre-op on 2/22 with surgeon and [facility].)   Upcoming R total knee replacement 11/27/20 under spinal anesthesia by Dr Lyla Glassing.   Denies recent fevers/chills, chest pain/tightness, shortness of breath, dizziness, headache or palpitations.   Has previously tolerated general anesthesia well, latest surgery was cystoscopy with ESWL for kidney stones. He's also had cervical spine surgery and right shoulder surgery 2015 and 2018 respectively. Denies h/o postop nausea/vomiting or difficulty awakening from surgery.   13 lb weight loss in the past 6 months! Completely has changed diet  More fruits/vegetables and less carbs, watching portion sizes. Has decreased eating out. Crystal light/water to drink. He is taking dog on more walks. Avoids stairs due to knee.   HTN - see recent mychart note - has needed to drop BP dose due to low BP readings to 32-76 systolic. BP better controlled on lower dose. Limits salt.   DM - does regularly check sugars 90-110. Compliant with antihyperglycemic regimen which includes: metformin 540m BID. Denies low sugars or hypoglycemic symptoms. Denies paresthesias. Last diabetic eye exam 03/2020. Pneumovax: 09/2019. Prevnar: 11/2017. Glucometer brand: onetouch. DSME: completed remotely. Lab Results  Component Value Date   HGBA1C 6.4 (A) 11/14/2020   Diabetic Foot Exam - Simple   Simple Foot Form Diabetic Foot exam was performed with the following findings: Yes 11/14/2020  8:13 AM  Visual Inspection No deformities, no ulcerations,  no other skin breakdown bilaterally: Yes Sensation Testing Intact to touch and monofilament testing bilaterally: Yes Pulse Check Posterior Tibialis and Dorsalis pulse intact bilaterally: Yes Comments    Lab Results  Component Value Date   MICROALBUR 4.5 (H) 04/27/2011        Relevant past medical, surgical, family and social history reviewed and updated as indicated. Interim medical history since our last visit reviewed. Allergies and medications reviewed and updated. Outpatient Medications Prior to Visit  Medication Sig Dispense Refill  . aspirin 81 MG tablet Take 81 mg by mouth daily.    .Marland Kitchenatorvastatin (LIPITOR) 40 MG tablet TAKE 1 TABLET DAILY (Patient taking differently: Take 40 mg by mouth daily.) 90 tablet 3  . blood glucose meter kit and supplies Fill One touch meter that is covered. Check sugar once daily. Dx E11.9 1 each 0  . gabapentin (NEURONTIN) 300 MG capsule Take 300 mg by mouth 2 (two) times daily.    .Marland Kitchenglucose blood test strip Check sugar once daily Dx E111.9 100 each 12  . Lancets (ONETOUCH ULTRASOFT) lancets Check sugar once daily Dx E11.9 100 each 12  . Magnesium 250 MG TABS Take 250 mg by mouth daily.    . metFORMIN (GLUCOPHAGE) 500 MG tablet Take 1 tablet (500 mg total) by mouth 2 (two) times daily with a meal. 180 tablet 1  . tamsulosin (FLOMAX) 0.4 MG CAPS capsule Take 1 capsule (0.4 mg total) by mouth daily. 30 capsule  3  . vitamin C (ASCORBIC ACID) 500 MG tablet Take 1 tablet (500 mg total) by mouth 2 (two) times daily.    [name] zinc gluconate 50 MG tablet Take 50 mg by mouth daily.    [name] lisinopril-hydrochlorothiazide (ZESTORETIC) 20-25 MG tablet Take 0.5 tablets by mouth daily.    . Melatonin 10 MG TABS Take 10 mg by mouth at bedtime.    . naproxen (NAPROSYN) 500 MG tablet TAKE 1 TABLET TWICE DAILY  WITH MEALS AS NEEDED FOR   MODERATE PAIN (Patient taking differently: Take 500 mg by mouth 2 (two) times daily.) 180 tablet 0   No facility-administered medications  prior to visit.     Per HPI unless specifically indicated in ROS section below Review of Systems Objective:  BP 118/62   Pulse 67   Temp (!) 97.5 F (36.4 C) (Temporal)   Ht 5' 4.25" (1.632 m)   Wt 222 lb 4 oz (100.8 kg)   SpO2 97%   BMI 37.85 kg/m   Wt Readings from Last 3 Encounters:  11/14/20 222 lb 4 oz (100.8 kg)  05/28/20 229 lb 1.6 oz (103.9 kg)  04/11/20 235 lb 3 oz (106.7 kg)      Physical Exam Vitals and nursing note reviewed.  Constitutional:      General: He is not in acute distress.    Appearance: Normal appearance. He is well-developed and well-nourished. He is not ill-appearing.  HENT:     Mouth/Throat:     Mouth: Oropharynx is clear and moist.  Eyes:     General: No scleral icterus.    Extraocular Movements: Extraocular movements intact and EOM normal.     Conjunctiva/sclera: Conjunctivae normal.     Pupils: Pupils are equal, round, and reactive to light.  Cardiovascular:     Rate and Rhythm: Normal rate and regular rhythm.     Pulses: Normal pulses and intact distal pulses.     Heart sounds: Normal heart sounds. No murmur heard.   Pulmonary:     Effort: Pulmonary effort is normal. No respiratory distress.     Breath sounds: Normal breath sounds. No wheezing, rhonchi or rales.  Musculoskeletal:        General: No edema.     Right lower leg: No edema.     Left lower leg: No edema.     Comments: See HPI for foot exam if done  Skin:    General: Skin is warm and dry.     Findings: No rash.  Neurological:     Mental Status: He is alert.  Psychiatric:        Mood and Affect: Mood and affect and mood normal.        Behavior: Behavior normal.       Results for orders placed or performed in visit on 11/14/20  Comprehensive metabolic panel  Result Value Ref Range   Sodium 134 (L) 135 - 145 mEq/L   Potassium 4.3 3.5 - 5.1 mEq/L   Chloride 96 96 - 112 mEq/L   CO2 32 19 - 32 mEq/L   Glucose, Bld 105 (H) 70 - 99 mg/dL   BUN 20 6 - 23 mg/dL    Creatinine, Ser 1.23 0.40 - 1.50 mg/dL   Total Bilirubin 0.8 0.2 - 1.2 mg/dL   Alkaline Phosphatase 75 39 - 117 U/L   AST 24 0 - 37 U/L   ALT 19 0 - 53 U/L   Total Protein 7.0 6.0 - 8.3 g/dL  Albumin 4.2 3.5 - 5.2 g/dL   GFR 60.36 >60.00 mL/min   Calcium 9.5 8.4 - 10.5 mg/dL  CBC with Differential/Platelet  Result Value Ref Range   WBC 5.4 4.0 - 10.5 K/uL   RBC 4.82 4.22 - 5.81 Mil/uL   Hemoglobin 14.4 13.0 - 17.0 g/dL   HCT 43.0 39.0 - 52.0 %   MCV 89.1 78.0 - 100.0 fl   MCHC 33.5 30.0 - 36.0 g/dL   RDW 13.4 11.5 - 15.5 %   Platelets 277.0 150.0 - 400.0 K/uL   Neutrophils Relative % 56.6 43.0 - 77.0 %   Lymphocytes Relative 24.4 12.0 - 46.0 %   Monocytes Relative 13.7 (H) 3.0 - 12.0 %   Eosinophils Relative 4.7 0.0 - 5.0 %   Basophils Relative 0.6 0.0 - 3.0 %   Neutro Abs 3.1 1.4 - 7.7 K/uL   Lymphs Abs 1.3 0.7 - 4.0 K/uL   Monocytes Absolute 0.7 0.1 - 1.0 K/uL   Eosinophils Absolute 0.3 0.0 - 0.7 K/uL   Basophils Absolute 0.0 0.0 - 0.1 K/uL  Protime-INR  Result Value Ref Range   INR 1.0 0.8 - 1.0 ratio   Prothrombin Time 11.1 9.6 - 13.1 sec  Magnesium  Result Value Ref Range   Magnesium 2.2 1.5 - 2.5 mg/dL  POCT glycosylated hemoglobin (Hb A1C)  Result Value Ref Range   Hemoglobin A1C 6.4 (A) 4.0 - 5.6 %   HbA1c POC (<> result, manual entry)     HbA1c, POC (prediabetic range)     HbA1c, POC (controlled diabetic range)    POCT Urinalysis Dipstick (Automated)  Result Value Ref Range   Color, UA light yellow    Clarity, UA clear    Glucose, UA Negative Negative   Bilirubin, UA negative    Ketones, UA negative    Spec Grav, UA 1.015 1.010 - 1.025   Blood, UA negative    pH, UA 6.0 5.0 - 8.0   Protein, UA Negative Negative   Urobilinogen, UA 0.2 0.2 or 1.0 E.U./dL   Nitrite, UA negative    Leukocytes, UA Negative Negative   DG Chest 2 View CLINICAL DATA:  Preoperative evaluation  EXAM: CHEST - 2 VIEW  COMPARISON:  01/10/2014  FINDINGS: Normal heart  size, mediastinal contours, and pulmonary vascularity.  Minimal subsegmental atelectasis at both lung bases.  Lungs otherwise clear.  Tiny LEFT pleural effusion.  No pneumothorax.  Prior cervical fusion and prior resection of the distal RIGHT clavicle.  IMPRESSION: Bibasilar atelectasis and tiny LEFT pleural effusion.  Electronically Signed   By: Lavonia Dana M.D.   On: 11/14/2020 17:54  EKG - NSR rate 60s, normal axis, intervals, no hypertrophy, no acute ST/T changes Assessment & Plan:  This visit occurred during the SARS-CoV-2 public health emergency.  Safety protocols were in place, including screening questions prior to the visit, additional usage of staff PPE, and extensive cleaning of exam room while observing appropriate contact time as indicated for disinfecting solutions.   Problem List Items Addressed This Visit    Severe obesity (BMI 35.0-39.9) with comorbidity ([place])    Congratulated on 13 lb weight loss over the past 6 months.  Pt motivated to continue sustainable healthy diet and lifestyle changes.       Primary osteoarthritis of right knee    Upcoming R knee replacement surgery.       Pre-op evaluation - Primary    RCRI = 0 Anticipate adequately low risk to proceed with surgery. Indication  for aspirin - diabetic with family history of CAD. Reasonable to hold aspirin preoperatively if desired by orthopedist.  Check labs including INR, EKG, CXR and urinalysis today.  Will forward results to Dr Lyla Glassing.       Relevant Orders   Comprehensive metabolic panel (Completed)   CBC with Differential/Platelet (Completed)   Protime-INR (Completed)   DG Chest 2 View (Completed)   Magnesium (Completed)   EKG 12-Lead (Completed)   POCT Urinalysis Dipstick (Automated) (Completed)   Essential hypertension, benign    Chronic, now overtreated with recent weight loss. Improved readings on 1/2 tablet lisinopril hctz 20/25 - will further drop to plain lisinopril 41m daily.        Relevant Medications   lisinopril (ZESTRIL) 10 MG tablet   Other Relevant Orders   Magnesium (Completed)   Controlled diabetes mellitus type 2 with complications (HCC)    Chronic. Great control with recent healthy diet and lifestyle changes and weight loss. Continue metformin 5011mbid. RTC 6 mo AMW.       Relevant Medications   lisinopril (ZESTRIL) 10 MG tablet   Other Relevant Orders   POCT glycosylated hemoglobin (Hb A1C) (Completed)   Protime-INR (Completed)    Other Visit Diagnoses    Pain in right leg        Relevant Orders   Protime-INR (Completed)       Meds ordered this encounter  Medications  . lisinopril (ZESTRIL) 10 MG tablet    Sig: Take 1 tablet (10 mg total) by mouth daily.    Dispense:  90 tablet    Refill:  1    To replace lisinopril/hctz   Orders Placed This Encounter  Procedures  . DG Chest 2 View    Standing Status:   Future    Number of Occurrences:   1    Standing Expiration Date:   11/14/2021    Order Specific Question:   Reason for Exam (SYMPTOM  OR DIAGNOSIS REQUIRED)    Answer:   preop eval    Order Specific Question:   Preferred imaging location?    Answer:   [facility]. Comprehensive metabolic panel  . CBC with Differential/Platelet  . Protime-INR  . Magnesium  . POCT glycosylated hemoglobin (Hb A1C)  . POCT Urinalysis Dipstick (Automated)  . EKG 12-Lead    Patient Instructions  Labs today, urinalysis today, EKG and chest xray today. You are doing great today!  Drop blood pressure medicine to plain lisinopril 1040maily.  We may stop the magnesium pending labs today. Ok to try off naprosyn and melatonin.  Anticipate you should do well with planned knee replacement surgery.  I hope you have a speedy recovery! Return in 6 months for wellness visit.   Follow up plan: Return in about 6 months (around 05/14/2021) for medicare wellness visit, follow up visit.  JavRia BushD

## 2020-11-14 NOTE — Assessment & Plan Note (Signed)
Congratulated on 13 lb weight loss over the past 6 months.  Pt motivated to continue sustainable healthy diet and lifestyle changes.

## 2020-11-14 NOTE — Assessment & Plan Note (Addendum)
RCRI = 0 Anticipate adequately low risk to proceed with surgery. Indication for aspirin - diabetic with family history of CAD. Reasonable to hold aspirin preoperatively if desired by orthopedist.  Check labs including INR, EKG, CXR and urinalysis today.  Will forward results to Dr Linna Caprice.

## 2020-11-14 NOTE — Patient Instructions (Addendum)
Labs today, urinalysis today, EKG and chest xray today. You are doing great today!  Drop blood pressure medicine to plain lisinopril 10mg  daily.  We may stop the magnesium pending labs today. Ok to try off naprosyn and melatonin.  Anticipate you should do well with planned knee replacement surgery.  I hope you have a speedy recovery! Return in 6 months for wellness visit.

## 2020-11-14 NOTE — Assessment & Plan Note (Signed)
Chronic. Great control with recent healthy diet and lifestyle changes and weight loss. Continue metformin 500mg  bid. RTC 6 mo AMW.

## 2020-11-14 NOTE — Assessment & Plan Note (Signed)
Upcoming R knee replacement surgery.

## 2020-11-14 NOTE — Assessment & Plan Note (Signed)
Chronic, now overtreated with recent weight loss. Improved readings on 1/2 tablet lisinopril hctz 20/25 - will further drop to plain lisinopril 10mg  daily.

## 2020-11-18 ENCOUNTER — Other Ambulatory Visit: Payer: Self-pay

## 2020-11-18 ENCOUNTER — Ambulatory Visit: Payer: Self-pay | Admitting: Student

## 2020-11-18 ENCOUNTER — Encounter (HOSPITAL_COMMUNITY): Payer: Self-pay

## 2020-11-18 ENCOUNTER — Encounter (HOSPITAL_COMMUNITY)
Admission: RE | Admit: 2020-11-18 | Discharge: 2020-11-18 | Disposition: A | Discharge status: Home / Self Care | Payer: Medicare Other | Source: Ambulatory Visit | Attending: Orthopedic Surgery | Admitting: Orthopedic Surgery

## 2020-11-18 DIAGNOSIS — Z01812 Encounter for preprocedural laboratory examination: Secondary | ICD-10-CM | POA: Insufficient documentation

## 2020-11-18 LAB — SURGICAL PCR SCREEN
MRSA, PCR: NEGATIVE
Staphylococcus aureus: NEGATIVE

## 2020-11-18 NOTE — Progress Notes (Addendum)
Anesthesia Review:  PCP: EDR Sue Lush [facility] 11/14/2020 on chart LOV 11/14/20 on chart  Labs of Pt, cbc, cmp and u/a done 11/14/2020 in epic.   And hgba1c- 6.4  Cardiologist : Chest x-ray : EKG : 11/14/20 on chart  Echo : Stress test: Cardiac Cath :  Activity level:  Sleep Study/ CPAP : Fasting Blood Sugar :      / Checks Blood Sugar -- times a day:   Blood Thinner/ Instructions /Last Dose: ASA / Instructions/ Last Dose :  81 mg Aspirin  DM- type 2  Has not used cpap in 25-30 years

## 2020-11-18 NOTE — H&P (View-Only) (Signed)
TOTAL KNEE ADMISSION H&P  Patient is being admitted for right total knee arthroplasty.  Subjective:  Chief Complaint:right knee pain.  HPI: Roger Mcdaniel, 70 y.o. male, has a history of pain and functional disability in the right knee due to arthritis and has failed non-surgical conservative treatments for greater than 12 weeks to includeNSAID's and/or analgesics and corticosteriod injections.  Onset of symptoms was gradual, starting 2 years ago with gradually worsening course since that time. The patient noted no past surgery on the right knee(s).  Patient currently rates pain in the right knee(s) at 8 out of 10 with activity. Patient has worsening of pain with activity and weight bearing, pain that interferes with activities of daily living and pain with passive range of motion.  Patient has evidence of subchondral cysts, subchondral sclerosis and joint space narrowing by imaging studies. There is no active infection.  Patient Active Problem List   Diagnosis Date Noted  . Pre-op evaluation 11/14/2020  . Neck pain on left side 07/30/2020  . Renal cysts, acquired, bilateral 04/11/2020  . Atherosclerosis of aorta ([place]) 03/10/2020  . Generalized osteoarthritis of hand 04/06/2019  . Primary osteoarthritis of right knee 11/21/2018  . Medicare annual wellness visit, initial 12/12/2017  . Right shoulder pain 07/08/2017  . Advanced care planning/counseling discussion 06/11/2017  . Insomnia 08/18/2016  . Controlled diabetes mellitus type 2 with complications ([place]) 42/68/3419  . Severe obesity (BMI 35.0-39.9) with comorbidity ([place]) 12/10/2015  . Onychomycosis 12/10/2015  . Grieving 12/10/2015  . Bunionette 12/23/2011  . Hyperlipidemia associated with type 2 diabetes mellitus ([place]) 07/10/2008  . Right nephrolithiasis 07/10/2008  . Essential hypertension, benign 02/12/2008  . Sleep apnea 02/12/2008   Past Medical History:  Diagnosis Date  . Bruises easily   . Chest pain   . CMC arthritis,  thumb, degenerative   . DDD (degenerative disc disease), cervical 2015   s/o ACDF ([place])  . DM type 2 (diabetes mellitus, type 2) ([place])   . Dry skin   . GERD 07/10/2008   Qualifier: Diagnosis of  By: Council Mechanic MD, Hilaria Ota   . GERD (gastroesophageal reflux disease) 09/2003  . History of ETT 05/23/2000   Nml (Dr. Acie Fredrickson)  . History of kidney stones   . HNP (herniated nucleus pulposus), cervical 12/20/2013  . Hyperlipidemia 09/2003  . Hypertension 06/2004  . OSA (obstructive sleep apnea)    was on CPAP, not used  x 15 years     Past Surgical History:  Procedure Laterality Date  . ANTERIOR CERVICAL DECOMP/DISCECTOMY FUSION N/A 01/14/2014   Procedure: ANTERIOR CERVICAL DECOMPRESSION/DISCECTOMY FUSION CERVICAL FOUR-FIVE,CERVICAL FIVE-SIX ;  Surgeon: Hosie Spangle, MD  . CATARACT EXTRACTION Right 02/28/2019   Per patient  . CATARACT EXTRACTION Left 03/12/2019   Per patient  . CYSTOSCOPY/URETEROSCOPY/HOLMIUM LASER/STENT PLACEMENT Right 05/28/2020   Procedure: CYSTOSCOPY RIGHT URETEROSCOPY/HOLMIUM LASER/STENT PLACEMENT STONE BASKET RETRACTION;  Surgeon: Lucas Mallow, MD;  Location: [facility];  Service: Urology;  Laterality: Right;  . EXTRACORPOREAL SHOCK WAVE LITHOTRIPSY Right 03/17/2020   Procedure: RIGHT EXTRACORPOREAL SHOCK WAVE LITHOTRIPSY (ESWL);  Surgeon: Ceasar Mons, MD;  Location: [facility];  Service: Urology;  Laterality: Right;  . ORIF ANKLE FRACTURE  1980   right  . RETINAL LASER PROCEDURE Right 03/14/2019   Per patient  . SEPTOPLASTY  2005   for sleep apnea //cpap  . SHOULDER SURGERY Right 06/2017   Dr Onnie Graham at [facility] ortho  . WRIST ARTHROSCOPY WITH CARPOMETACARPEL (CMC) ARTHROPLASTY Right  11/2018   R CMC and R MCP arthroplasty ([place])    Current Outpatient Medications  Medication Sig Dispense Refill Last Dose  . aspirin 81 MG tablet Take 81 mg by mouth daily.     [name] atorvastatin (LIPITOR) 40 MG tablet TAKE 1  TABLET DAILY (Patient taking differently: Take 40 mg by mouth daily.) 90 tablet 3   . blood glucose meter kit and supplies Fill One touch meter that is covered. Check sugar once daily. Dx E11.9 1 each 0   . gabapentin (NEURONTIN) 300 MG capsule Take 300 mg by mouth 2 (two) times daily.     [name] glucose blood test strip Check sugar once daily Dx E111.9 100 each 12   . Lancets (ONETOUCH ULTRASOFT) lancets Check sugar once daily Dx E11.9 100 each 12   . lisinopril (ZESTRIL) 10 MG tablet Take 1 tablet (10 mg total) by mouth daily. 90 tablet 1   . Magnesium 250 MG TABS Take 250 mg by mouth daily.     . metFORMIN (GLUCOPHAGE) 500 MG tablet Take 1 tablet (500 mg total) by mouth 2 (two) times daily with a meal. 180 tablet 1   . tamsulosin (FLOMAX) 0.4 MG CAPS capsule Take 1 capsule (0.4 mg total) by mouth daily. 30 capsule 3   . vitamin C (ASCORBIC ACID) 500 MG tablet Take 1 tablet (500 mg total) by mouth 2 (two) times daily.     [name] zinc gluconate 50 MG tablet Take 50 mg by mouth daily.      No current facility-administered medications for this visit.   Allergies  Allergen Reactions  . Codeine Sulfate Nausea Only  . Pravachol [Pravastatin Sodium] Other (See Comments)    Muscle aches    Social History   Tobacco Use  . Smoking status: Never Smoker  . Smokeless tobacco: Never Used  Substance Use Topics  . Alcohol use: Yes    Alcohol/week: 1.0 standard drink    Types: 1 Standard drinks or equivalent per week    Comment: 1-2 weekly     Family History  Problem Relation Age of Onset  . Heart disease Father        MI x 2  . Hypertension Brother   . Obesity Brother   . Heart disease Paternal Grandfather         MI  . CVA Mother   . Cancer Other      Review of Systems  Musculoskeletal: Positive for arthralgias.  All other systems reviewed and are negative.   Objective:  Physical Exam Constitutional:      Appearance: Normal appearance.  HENT:     Head: Normocephalic.  Eyes:      Pupils: Pupils are equal, round, and reactive to light.  Cardiovascular:     Rate and Rhythm: Normal rate and regular rhythm.     Pulses: Normal pulses.  Pulmonary:     Breath sounds: Normal breath sounds.  Abdominal:     Palpations: Abdomen is soft.     Tenderness: There is no abdominal tenderness.  Genitourinary:    Comments: Deferred Musculoskeletal:     Cervical back: Normal range of motion and neck supple.     Comments: Examination of the right knee reveals no skin wounds or lesions. Valgus deformity. He has swelling, trace effusion. No warmth or erythema. Tenderness to palpation medial joint line, lateral joint line, parapatellar retinacular tissue with positive grind sign. Range of motion 15-115. No extensor lag.  Skin:    General: Skin  is warm and dry.  Neurological:     Mental Status: He is alert and oriented to person, place, and time.  Psychiatric:        Mood and Affect: Mood normal.     Vital signs in last 24 hours: _0 @  Labs:   Estimated body mass index is 37.85 kg/m as calculated from the following:   Height as of 11/14/20: 5' 4.25" (1.632 m).   Weight as of 11/14/20: 100.8 kg.   Imaging Review Plain radiographs demonstrate severe degenerative joint disease of the right knee(s). The overall alignment isneutral. The bone quality appears to be adequate for age and reported activity level.      Assessment/Plan:  End stage arthritis, right knee   The patient history, physical examination, clinical judgment of the provider and imaging studies are consistent with end stage degenerative joint disease of the right knee(s) and total knee arthroplasty is deemed medically necessary. The treatment options including medical management, injection therapy arthroscopy and arthroplasty were discussed at length. The risks and benefits of total knee arthroplasty were presented and reviewed. The risks due to aseptic loosening, infection, stiffness, patella tracking  problems, thromboembolic complications and other imponderables were discussed. The patient acknowledged the explanation, agreed to proceed with the plan and consent was signed. Patient is being admitted for inpatient treatment for surgery, pain control, PT, OT, prophylactic antibiotics, VTE prophylaxis, progressive ambulation and ADL's and discharge planning. The patient is planning to be discharged home same day     Patient's anticipated LOS is less than 2 midnights, meeting these requirements: - Lives within 1 hour of care - Has a competent adult at home to recover with post-op recover - NO history of  - Chronic pain requiring opiods  - Coronary Artery Disease  - Heart failure  - Heart attack  - Stroke  - DVT/VTE  - Cardiac arrhythmia  - Respiratory Failure/COPD  - Renal failure  - Anemia  - Advanced Liver disease

## 2020-11-18 NOTE — H&P (Signed)
TOTAL KNEE ADMISSION H&P  Patient is being admitted for right total knee arthroplasty.  Subjective:  Chief Complaint:right knee pain.  HPI: Roger Mcdaniel, 70 y.o. male, has a history of pain and functional disability in the right knee due to arthritis and has failed non-surgical conservative treatments for greater than 12 weeks to includeNSAID's and/or analgesics and corticosteriod injections.  Onset of symptoms was gradual, starting 2 years ago with gradually worsening course since that time. The patient noted no past surgery on the right knee(s).  Patient currently rates pain in the right knee(s) at 8 out of 10 with activity. Patient has worsening of pain with activity and weight bearing, pain that interferes with activities of daily living and pain with passive range of motion.  Patient has evidence of subchondral cysts, subchondral sclerosis and joint space narrowing by imaging studies. There is no active infection.  Patient Active Problem List   Diagnosis Date Noted  . Pre-op evaluation 11/14/2020  . Neck pain on left side 07/30/2020  . Renal cysts, acquired, bilateral 04/11/2020  . Atherosclerosis of aorta ([place]) 03/10/2020  . Generalized osteoarthritis of hand 04/06/2019  . Primary osteoarthritis of right knee 11/21/2018  . Medicare annual wellness visit, initial 12/12/2017  . Right shoulder pain 07/08/2017  . Advanced care planning/counseling discussion 06/11/2017  . Insomnia 08/18/2016  . Controlled diabetes mellitus type 2 with complications ([place]) 42/68/3419  . Severe obesity (BMI 35.0-39.9) with comorbidity ([place]) 12/10/2015  . Onychomycosis 12/10/2015  . Grieving 12/10/2015  . Bunionette 12/23/2011  . Hyperlipidemia associated with type 2 diabetes mellitus ([place]) 07/10/2008  . Right nephrolithiasis 07/10/2008  . Essential hypertension, benign 02/12/2008  . Sleep apnea 02/12/2008   Past Medical History:  Diagnosis Date  . Bruises easily   . Chest pain   . CMC arthritis,  thumb, degenerative   . DDD (degenerative disc disease), cervical 2015   s/o ACDF ([place])  . DM type 2 (diabetes mellitus, type 2) ([place])   . Dry skin   . GERD 07/10/2008   Qualifier: Diagnosis of  By: Council Mechanic MD, Hilaria Ota   . GERD (gastroesophageal reflux disease) 09/2003  . History of ETT 05/23/2000   Nml (Dr. Acie Fredrickson)  . History of kidney stones   . HNP (herniated nucleus pulposus), cervical 12/20/2013  . Hyperlipidemia 09/2003  . Hypertension 06/2004  . OSA (obstructive sleep apnea)    was on CPAP, not used  x 15 years     Past Surgical History:  Procedure Laterality Date  . ANTERIOR CERVICAL DECOMP/DISCECTOMY FUSION N/A 01/14/2014   Procedure: ANTERIOR CERVICAL DECOMPRESSION/DISCECTOMY FUSION CERVICAL FOUR-FIVE,CERVICAL FIVE-SIX ;  Surgeon: Hosie Spangle, MD  . CATARACT EXTRACTION Right 02/28/2019   Per patient  . CATARACT EXTRACTION Left 03/12/2019   Per patient  . CYSTOSCOPY/URETEROSCOPY/HOLMIUM LASER/STENT PLACEMENT Right 05/28/2020   Procedure: CYSTOSCOPY RIGHT URETEROSCOPY/HOLMIUM LASER/STENT PLACEMENT STONE BASKET RETRACTION;  Surgeon: Lucas Mallow, MD;  Location: [facility];  Service: Urology;  Laterality: Right;  . EXTRACORPOREAL SHOCK WAVE LITHOTRIPSY Right 03/17/2020   Procedure: RIGHT EXTRACORPOREAL SHOCK WAVE LITHOTRIPSY (ESWL);  Surgeon: Ceasar Mons, MD;  Location: [facility];  Service: Urology;  Laterality: Right;  . ORIF ANKLE FRACTURE  1980   right  . RETINAL LASER PROCEDURE Right 03/14/2019   Per patient  . SEPTOPLASTY  2005   for sleep apnea //cpap  . SHOULDER SURGERY Right 06/2017   Dr Onnie Graham at [facility] ortho  . WRIST ARTHROSCOPY WITH CARPOMETACARPEL (CMC) ARTHROPLASTY Right  11/2018   R CMC and R MCP arthroplasty ([place])    Current Outpatient Medications  Medication Sig Dispense Refill Last Dose  . aspirin 81 MG tablet Take 81 mg by mouth daily.     [name] atorvastatin (LIPITOR) 40 MG tablet TAKE 1  TABLET DAILY (Patient taking differently: Take 40 mg by mouth daily.) 90 tablet 3   . blood glucose meter kit and supplies Fill One touch meter that is covered. Check sugar once daily. Dx E11.9 1 each 0   . gabapentin (NEURONTIN) 300 MG capsule Take 300 mg by mouth 2 (two) times daily.     [name] glucose blood test strip Check sugar once daily Dx E111.9 100 each 12   . Lancets (ONETOUCH ULTRASOFT) lancets Check sugar once daily Dx E11.9 100 each 12   . lisinopril (ZESTRIL) 10 MG tablet Take 1 tablet (10 mg total) by mouth daily. 90 tablet 1   . Magnesium 250 MG TABS Take 250 mg by mouth daily.     . metFORMIN (GLUCOPHAGE) 500 MG tablet Take 1 tablet (500 mg total) by mouth 2 (two) times daily with a meal. 180 tablet 1   . tamsulosin (FLOMAX) 0.4 MG CAPS capsule Take 1 capsule (0.4 mg total) by mouth daily. 30 capsule 3   . vitamin C (ASCORBIC ACID) 500 MG tablet Take 1 tablet (500 mg total) by mouth 2 (two) times daily.     [name] zinc gluconate 50 MG tablet Take 50 mg by mouth daily.      No current facility-administered medications for this visit.   Allergies  Allergen Reactions  . Codeine Sulfate Nausea Only  . Pravachol [Pravastatin Sodium] Other (See Comments)    Muscle aches    Social History   Tobacco Use  . Smoking status: Never Smoker  . Smokeless tobacco: Never Used  Substance Use Topics  . Alcohol use: Yes    Alcohol/week: 1.0 standard drink    Types: 1 Standard drinks or equivalent per week    Comment: 1-2 weekly     Family History  Problem Relation Age of Onset  . Heart disease Father        MI x 2  . Hypertension Brother   . Obesity Brother   . Heart disease Paternal Grandfather         MI  . CVA Mother   . Cancer Other      Review of Systems  Musculoskeletal: Positive for arthralgias.  All other systems reviewed and are negative.   Objective:  Physical Exam Constitutional:      Appearance: Normal appearance.  HENT:     Head: Normocephalic.  Eyes:      Pupils: Pupils are equal, round, and reactive to light.  Cardiovascular:     Rate and Rhythm: Normal rate and regular rhythm.     Pulses: Normal pulses.  Pulmonary:     Breath sounds: Normal breath sounds.  Abdominal:     Palpations: Abdomen is soft.     Tenderness: There is no abdominal tenderness.  Genitourinary:    Comments: Deferred Musculoskeletal:     Cervical back: Normal range of motion and neck supple.     Comments: Examination of the right knee reveals no skin wounds or lesions. Valgus deformity. He has swelling, trace effusion. No warmth or erythema. Tenderness to palpation medial joint line, lateral joint line, parapatellar retinacular tissue with positive grind sign. Range of motion 15-115. No extensor lag.  Skin:    General: Skin  is warm and dry.  Neurological:     Mental Status: He is alert and oriented to person, place, and time.  Psychiatric:        Mood and Affect: Mood normal.     Vital signs in last 24 hours: _0 @  Labs:   Estimated body mass index is 37.85 kg/m as calculated from the following:   Height as of 11/14/20: 5' 4.25" (1.632 m).   Weight as of 11/14/20: 100.8 kg.   Imaging Review Plain radiographs demonstrate severe degenerative joint disease of the right knee(s). The overall alignment isneutral. The bone quality appears to be adequate for age and reported activity level.      Assessment/Plan:  End stage arthritis, right knee   The patient history, physical examination, clinical judgment of the provider and imaging studies are consistent with end stage degenerative joint disease of the right knee(s) and total knee arthroplasty is deemed medically necessary. The treatment options including medical management, injection therapy arthroscopy and arthroplasty were discussed at length. The risks and benefits of total knee arthroplasty were presented and reviewed. The risks due to aseptic loosening, infection, stiffness, patella tracking  problems, thromboembolic complications and other imponderables were discussed. The patient acknowledged the explanation, agreed to proceed with the plan and consent was signed. Patient is being admitted for inpatient treatment for surgery, pain control, PT, OT, prophylactic antibiotics, VTE prophylaxis, progressive ambulation and ADL's and discharge planning. The patient is planning to be discharged home same day     Patient's anticipated LOS is less than 2 midnights, meeting these requirements: - Lives within 1 hour of care - Has a competent adult at home to recover with post-op recover - NO history of  - Chronic pain requiring opiods  - Coronary Artery Disease  - Heart failure  - Heart attack  - Stroke  - DVT/VTE  - Cardiac arrhythmia  - Respiratory Failure/COPD  - Renal failure  - Anemia  - Advanced Liver disease

## 2020-11-24 ENCOUNTER — Other Ambulatory Visit (HOSPITAL_COMMUNITY)
Admission: RE | Admit: 2020-11-24 | Discharge: 2020-11-24 | Disposition: A | Discharge status: Home / Self Care | Payer: Medicare Other | Source: Ambulatory Visit | Attending: Orthopedic Surgery | Admitting: Orthopedic Surgery

## 2020-11-24 DIAGNOSIS — Z20822 Contact with and (suspected) exposure to covid-19: Secondary | ICD-10-CM | POA: Insufficient documentation

## 2020-11-24 DIAGNOSIS — Z01812 Encounter for preprocedural laboratory examination: Secondary | ICD-10-CM | POA: Insufficient documentation

## 2020-11-24 LAB — SARS CORONAVIRUS 2 (TAT 6-24 HRS): SARS Coronavirus 2: NEGATIVE

## 2020-11-27 ENCOUNTER — Other Ambulatory Visit: Payer: Self-pay

## 2020-11-27 ENCOUNTER — Encounter (HOSPITAL_COMMUNITY): Payer: Self-pay | Admitting: Orthopedic Surgery

## 2020-11-27 ENCOUNTER — Ambulatory Visit (HOSPITAL_COMMUNITY)
Admission: RE | Admit: 2020-11-27 | Discharge: 2020-11-28 | Disposition: A | Discharge status: Home / Self Care | Payer: Medicare Other | Source: Ambulatory Visit | Attending: Orthopedic Surgery | Admitting: Orthopedic Surgery

## 2020-11-27 ENCOUNTER — Encounter (HOSPITAL_COMMUNITY)
Admission: RE | Disposition: A | Discharge status: Home / Self Care | Payer: Self-pay | Source: Ambulatory Visit | Attending: Orthopedic Surgery

## 2020-11-27 ENCOUNTER — Ambulatory Visit (HOSPITAL_COMMUNITY): Payer: Medicare Other | Admitting: Anesthesiology

## 2020-11-27 ENCOUNTER — Ambulatory Visit (HOSPITAL_COMMUNITY): Payer: Medicare Other

## 2020-11-27 ENCOUNTER — Ambulatory Visit (HOSPITAL_COMMUNITY): Payer: Medicare Other | Admitting: Physician Assistant

## 2020-11-27 DIAGNOSIS — Z96651 Presence of right artificial knee joint: Secondary | ICD-10-CM | POA: Diagnosis not present

## 2020-11-27 DIAGNOSIS — M1711 Unilateral primary osteoarthritis, right knee: Secondary | ICD-10-CM | POA: Insufficient documentation

## 2020-11-27 DIAGNOSIS — Z471 Aftercare following joint replacement surgery: Secondary | ICD-10-CM | POA: Diagnosis not present

## 2020-11-27 DIAGNOSIS — E785 Hyperlipidemia, unspecified: Secondary | ICD-10-CM | POA: Diagnosis not present

## 2020-11-27 DIAGNOSIS — Z7982 Long term (current) use of aspirin: Secondary | ICD-10-CM | POA: Diagnosis not present

## 2020-11-27 DIAGNOSIS — G4730 Sleep apnea, unspecified: Secondary | ICD-10-CM | POA: Diagnosis not present

## 2020-11-27 DIAGNOSIS — E119 Type 2 diabetes mellitus without complications: Secondary | ICD-10-CM | POA: Diagnosis not present

## 2020-11-27 DIAGNOSIS — G8918 Other acute postprocedural pain: Secondary | ICD-10-CM | POA: Diagnosis not present

## 2020-11-27 DIAGNOSIS — Z7984 Long term (current) use of oral hypoglycemic drugs: Secondary | ICD-10-CM | POA: Insufficient documentation

## 2020-11-27 DIAGNOSIS — I10 Essential (primary) hypertension: Secondary | ICD-10-CM | POA: Diagnosis not present

## 2020-11-27 DIAGNOSIS — Z79899 Other long term (current) drug therapy: Secondary | ICD-10-CM | POA: Insufficient documentation

## 2020-11-27 HISTORY — PX: KNEE ARTHROPLASTY: SHX992

## 2020-11-27 LAB — GLUCOSE, CAPILLARY
Glucose-Capillary: 126 mg/dL — ABNORMAL HIGH (ref 70–99)
Glucose-Capillary: 125 mg/dL — ABNORMAL HIGH (ref 70–99)
Glucose-Capillary: 166 mg/dL — ABNORMAL HIGH (ref 70–99)
Glucose-Capillary: 228 mg/dL — ABNORMAL HIGH (ref 70–99)

## 2020-11-27 LAB — TYPE AND SCREEN
ABO/RH(D): O NEG
Antibody Screen: NEGATIVE

## 2020-11-27 LAB — ABO/RH: ABO/RH(D): O NEG

## 2020-11-27 SURGERY — ARTHROPLASTY, KNEE, TOTAL, USING IMAGELESS COMPUTER-ASSISTED NAVIGATION
Anesthesia: Regional | Site: Knee | Laterality: Right

## 2020-11-27 MED ORDER — ONDANSETRON HCL 4 MG/2ML IJ SOLN: 4.0000 mg | Freq: Once | INTRAMUSCULAR | Status: DC | PRN

## 2020-11-27 MED ORDER — PROPOFOL 1000 MG/100ML IV EMUL
INTRAVENOUS | Status: AC
  Filled 2020-11-27: qty 100

## 2020-11-27 MED ORDER — METHOCARBAMOL 500 MG PO TABS
500.0000 mg | ORAL_TABLET | Freq: Four times a day (QID) | ORAL | Status: DC | PRN
  Administered 2020-11-27 – 2020-11-28 (×4): 500 mg via ORAL
  Filled 2020-11-27 (×4): qty 1

## 2020-11-27 MED ORDER — ZINC SULFATE 220 (50 ZN) MG PO CAPS
220.0000 mg | ORAL_CAPSULE | Freq: Every day | ORAL | Status: DC
  Administered 2020-11-28: 220 mg via ORAL
  Filled 2020-11-27: qty 1

## 2020-11-27 MED ORDER — ATORVASTATIN CALCIUM 40 MG PO TABS
40.0000 mg | ORAL_TABLET | Freq: Every day | ORAL | Status: DC
  Administered 2020-11-27: 40 mg via ORAL
  Filled 2020-11-27: qty 1

## 2020-11-27 MED ORDER — ACETAMINOPHEN 10 MG/ML IV SOLN
1000.0000 mg | Freq: Once | INTRAVENOUS | Status: AC
  Administered 2020-11-27: 1000 mg via INTRAVENOUS
  Filled 2020-11-27: qty 100

## 2020-11-27 MED ORDER — SODIUM CHLORIDE (PF) 0.9 % IJ SOLN
INTRAMUSCULAR | Status: DC | PRN
  Administered 2020-11-27: 50 mL

## 2020-11-27 MED ORDER — BUPIVACAINE-EPINEPHRINE (PF) 0.25% -1:200000 IJ SOLN
INTRAMUSCULAR | Status: AC
  Filled 2020-11-27: qty 30

## 2020-11-27 MED ORDER — METOCLOPRAMIDE HCL 5 MG/ML IJ SOLN
5.0000 mg | Freq: Three times a day (TID) | INTRAMUSCULAR | Status: DC | PRN
Start: 2020-11-27 — End: 2020-11-28

## 2020-11-27 MED ORDER — 0.9 % SODIUM CHLORIDE (POUR BTL) OPTIME
TOPICAL | Status: DC | PRN
  Administered 2020-11-27: 1000 mL

## 2020-11-27 MED ORDER — ONDANSETRON HCL 4 MG/2ML IJ SOLN
INTRAMUSCULAR | Status: AC
  Filled 2020-11-27: qty 2

## 2020-11-27 MED ORDER — BUPIVACAINE IN DEXTROSE 0.75-8.25 % IT SOLN
INTRATHECAL | Status: DC | PRN
  Administered 2020-11-27: 2 mL via INTRATHECAL

## 2020-11-27 MED ORDER — ASPIRIN 81 MG PO CHEW
81.0000 mg | CHEWABLE_TABLET | Freq: Two times a day (BID) | ORAL | Status: DC
  Administered 2020-11-27 – 2020-11-28 (×2): 81 mg via ORAL
  Filled 2020-11-27 (×2): qty 1

## 2020-11-27 MED ORDER — POVIDONE-IODINE 10 % EX SWAB
2.0000 "application " | Freq: Once | CUTANEOUS | Status: AC
  Administered 2020-11-27: 2 via TOPICAL

## 2020-11-27 MED ORDER — BUPIVACAINE-EPINEPHRINE 0.25% -1:200000 IJ SOLN
INTRAMUSCULAR | Status: DC | PRN
  Administered 2020-11-27: 30 mL

## 2020-11-27 MED ORDER — INSULIN ASPART 100 UNIT/ML ~~LOC~~ SOLN
0.0000 [IU] | Freq: Every day | SUBCUTANEOUS | Status: DC
  Administered 2020-11-27: 2 [IU] via SUBCUTANEOUS

## 2020-11-27 MED ORDER — MORPHINE SULFATE (PF) 2 MG/ML IV SOLN: 0.5000 mg | INTRAVENOUS | Status: DC | PRN

## 2020-11-27 MED ORDER — MIDAZOLAM HCL 2 MG/2ML IJ SOLN
1.0000 mg | Freq: Once | INTRAMUSCULAR | Status: AC
  Administered 2020-11-27: 2 mg via INTRAVENOUS
  Filled 2020-11-27: qty 2

## 2020-11-27 MED ORDER — INSULIN ASPART 100 UNIT/ML ~~LOC~~ SOLN
0.0000 [IU] | Freq: Three times a day (TID) | SUBCUTANEOUS | Status: DC
  Administered 2020-11-27: 2 [IU] via SUBCUTANEOUS

## 2020-11-27 MED ORDER — ACETAMINOPHEN 325 MG PO TABS: 325.0000 mg | ORAL_TABLET | Freq: Four times a day (QID) | ORAL | Status: DC | PRN

## 2020-11-27 MED ORDER — ONDANSETRON HCL 4 MG/2ML IJ SOLN: 4.0000 mg | Freq: Four times a day (QID) | INTRAMUSCULAR | Status: DC | PRN

## 2020-11-27 MED ORDER — ISOPROPYL ALCOHOL 70 % SOLN
Status: DC | PRN
  Administered 2020-11-27: 1 via TOPICAL

## 2020-11-27 MED ORDER — PROPOFOL 500 MG/50ML IV EMUL
INTRAVENOUS | Status: DC | PRN
  Administered 2020-11-27: 75 ug/kg/min via INTRAVENOUS

## 2020-11-27 MED ORDER — PROPOFOL 10 MG/ML IV BOLUS
INTRAVENOUS | Status: AC
  Filled 2020-11-27: qty 20

## 2020-11-27 MED ORDER — KETOROLAC TROMETHAMINE 30 MG/ML IJ SOLN
INTRAMUSCULAR | Status: AC
  Filled 2020-11-27: qty 1

## 2020-11-27 MED ORDER — SODIUM CHLORIDE 0.9 % IR SOLN
Status: DC | PRN
  Administered 2020-11-27: 3000 mL

## 2020-11-27 MED ORDER — HYDROCODONE-ACETAMINOPHEN 5-325 MG PO TABS
1.0000 | ORAL_TABLET | ORAL | Status: DC | PRN
  Administered 2020-11-27 – 2020-11-28 (×5): 1 via ORAL
  Filled 2020-11-27 (×5): qty 1

## 2020-11-27 MED ORDER — PROPOFOL 10 MG/ML IV BOLUS
INTRAVENOUS | Status: DC | PRN
  Administered 2020-11-27: 20 mg via INTRAVENOUS
  Administered 2020-11-27: 10 mg via INTRAVENOUS
  Administered 2020-11-27: 20 mg via INTRAVENOUS

## 2020-11-27 MED ORDER — PHENYLEPHRINE 40 MCG/ML (10ML) SYRINGE FOR IV PUSH (FOR BLOOD PRESSURE SUPPORT)
PREFILLED_SYRINGE | INTRAVENOUS | Status: AC
  Filled 2020-11-27: qty 10

## 2020-11-27 MED ORDER — CEFAZOLIN SODIUM-DEXTROSE 2-4 GM/100ML-% IV SOLN
2.0000 g | INTRAVENOUS | Status: AC
  Administered 2020-11-27: 2 g via INTRAVENOUS
  Filled 2020-11-27: qty 100

## 2020-11-27 MED ORDER — ONDANSETRON HCL 4 MG/2ML IJ SOLN
INTRAMUSCULAR | Status: DC | PRN
  Administered 2020-11-27: 4 mg via INTRAVENOUS

## 2020-11-27 MED ORDER — MENTHOL 3 MG MT LOZG: 1.0000 | LOZENGE | OROMUCOSAL | Status: DC | PRN

## 2020-11-27 MED ORDER — SODIUM CHLORIDE 0.9% FLUSH
INTRAVENOUS | Status: DC | PRN
  Administered 2020-11-27: 30 mL

## 2020-11-27 MED ORDER — LACTATED RINGERS IV SOLN: INTRAVENOUS | Status: DC

## 2020-11-27 MED ORDER — POLYETHYLENE GLYCOL 3350 17 G PO PACK: 17.0000 g | PACK | Freq: Every day | ORAL | Status: DC | PRN

## 2020-11-27 MED ORDER — CELECOXIB 200 MG PO CAPS
200.0000 mg | ORAL_CAPSULE | Freq: Two times a day (BID) | ORAL | Status: DC
  Administered 2020-11-27 – 2020-11-28 (×2): 200 mg via ORAL
  Filled 2020-11-27 (×2): qty 1

## 2020-11-27 MED ORDER — DEXAMETHASONE SODIUM PHOSPHATE 10 MG/ML IJ SOLN
INTRAMUSCULAR | Status: AC
  Filled 2020-11-27: qty 1

## 2020-11-27 MED ORDER — METHOCARBAMOL 500 MG IVPB - SIMPLE MED
500.0000 mg | Freq: Four times a day (QID) | INTRAVENOUS | Status: DC | PRN
  Filled 2020-11-27: qty 50

## 2020-11-27 MED ORDER — METFORMIN HCL 500 MG PO TABS
500.0000 mg | ORAL_TABLET | Freq: Two times a day (BID) | ORAL | Status: DC
Start: 2020-11-27 — End: 2020-11-28
  Administered 2020-11-27 – 2020-11-28 (×2): 500 mg via ORAL
  Filled 2020-11-27 (×2): qty 1

## 2020-11-27 MED ORDER — DOCUSATE SODIUM 100 MG PO CAPS
100.0000 mg | ORAL_CAPSULE | Freq: Two times a day (BID) | ORAL | Status: DC
  Administered 2020-11-27 – 2020-11-28 (×2): 100 mg via ORAL
  Filled 2020-11-27 (×2): qty 1

## 2020-11-27 MED ORDER — GABAPENTIN 300 MG PO CAPS
300.0000 mg | ORAL_CAPSULE | Freq: Two times a day (BID) | ORAL | Status: DC
  Administered 2020-11-27 – 2020-11-28 (×2): 300 mg via ORAL
  Filled 2020-11-27 (×2): qty 1

## 2020-11-27 MED ORDER — SODIUM CHLORIDE 0.9 % IV SOLN: INTRAVENOUS | Status: DC

## 2020-11-27 MED ORDER — MAGNESIUM OXIDE 400 (241.3 MG) MG PO TABS
200.0000 mg | ORAL_TABLET | Freq: Every day | ORAL | Status: DC
  Administered 2020-11-28: 200 mg via ORAL
  Filled 2020-11-27: qty 1

## 2020-11-27 MED ORDER — DEXAMETHASONE SODIUM PHOSPHATE 10 MG/ML IJ SOLN
10.0000 mg | Freq: Once | INTRAMUSCULAR | Status: AC
  Administered 2020-11-28: 10 mg via INTRAVENOUS
  Filled 2020-11-27: qty 1

## 2020-11-27 MED ORDER — POVIDONE-IODINE 10 % EX SWAB: 2.0000 "application " | Freq: Once | CUTANEOUS | Status: DC

## 2020-11-27 MED ORDER — EPHEDRINE 5 MG/ML INJ
INTRAVENOUS | Status: AC
  Filled 2020-11-27: qty 10

## 2020-11-27 MED ORDER — ONDANSETRON HCL 4 MG PO TABS: 4.0000 mg | ORAL_TABLET | Freq: Four times a day (QID) | ORAL | Status: DC | PRN

## 2020-11-27 MED ORDER — DIPHENHYDRAMINE HCL 12.5 MG/5ML PO ELIX
12.5000 mg | ORAL_SOLUTION | ORAL | Status: DC | PRN
Start: 2020-11-27 — End: 2020-11-28
  Administered 2020-11-27: 12.5 mg via ORAL
  Filled 2020-11-27: qty 5

## 2020-11-27 MED ORDER — FENTANYL CITRATE (PF) 100 MCG/2ML IJ SOLN
50.0000 ug | Freq: Once | INTRAMUSCULAR | Status: DC
  Filled 2020-11-27: qty 2

## 2020-11-27 MED ORDER — SENNA 8.6 MG PO TABS
1.0000 | ORAL_TABLET | Freq: Two times a day (BID) | ORAL | Status: DC
  Administered 2020-11-27 – 2020-11-28 (×2): 8.6 mg via ORAL
  Filled 2020-11-27 (×2): qty 1

## 2020-11-27 MED ORDER — KETOROLAC TROMETHAMINE 30 MG/ML IJ SOLN
INTRAMUSCULAR | Status: DC | PRN
  Administered 2020-11-27: 30 mg via INTRAMUSCULAR

## 2020-11-27 MED ORDER — PHENOL 1.4 % MT LIQD: 1.0000 | OROMUCOSAL | Status: DC | PRN

## 2020-11-27 MED ORDER — FENTANYL CITRATE (PF) 100 MCG/2ML IJ SOLN: 25.0000 ug | INTRAMUSCULAR | Status: DC | PRN

## 2020-11-27 MED ORDER — CEFAZOLIN SODIUM-DEXTROSE 2-4 GM/100ML-% IV SOLN
2.0000 g | Freq: Four times a day (QID) | INTRAVENOUS | Status: AC
  Administered 2020-11-27 (×2): 2 g via INTRAVENOUS
  Filled 2020-11-27 (×2): qty 100

## 2020-11-27 MED ORDER — BUPIVACAINE-EPINEPHRINE (PF) 0.5% -1:200000 IJ SOLN
INTRAMUSCULAR | Status: DC | PRN
  Administered 2020-11-27: 30 mL via PERINEURAL

## 2020-11-27 MED ORDER — STERILE WATER FOR IRRIGATION IR SOLN
Status: DC | PRN
  Administered 2020-11-27: 2000 mL

## 2020-11-27 MED ORDER — PHENYLEPHRINE 40 MCG/ML (10ML) SYRINGE FOR IV PUSH (FOR BLOOD PRESSURE SUPPORT)
PREFILLED_SYRINGE | INTRAVENOUS | Status: DC | PRN
  Administered 2020-11-27: 80 ug via INTRAVENOUS

## 2020-11-27 MED ORDER — ALUM & MAG HYDROXIDE-SIMETH 200-200-20 MG/5ML PO SUSP: 30.0000 mL | ORAL | Status: DC | PRN

## 2020-11-27 MED ORDER — TRANEXAMIC ACID-NACL 1000-0.7 MG/100ML-% IV SOLN
1000.0000 mg | INTRAVENOUS | Status: AC
  Administered 2020-11-27: 1000 mg via INTRAVENOUS
  Filled 2020-11-27: qty 100

## 2020-11-27 MED ORDER — METOCLOPRAMIDE HCL 5 MG PO TABS
5.0000 mg | ORAL_TABLET | Freq: Three times a day (TID) | ORAL | Status: DC | PRN
Start: 2020-11-27 — End: 2020-11-28

## 2020-11-27 MED ORDER — HYDROCODONE-ACETAMINOPHEN 7.5-325 MG PO TABS: 1.0000 | ORAL_TABLET | ORAL | Status: DC | PRN

## 2020-11-27 MED ORDER — PHENYLEPHRINE HCL-NACL 10-0.9 MG/250ML-% IV SOLN
INTRAVENOUS | Status: DC | PRN
  Administered 2020-11-27: 35 ug/min via INTRAVENOUS

## 2020-11-27 MED ORDER — TAMSULOSIN HCL 0.4 MG PO CAPS
0.4000 mg | ORAL_CAPSULE | Freq: Every day | ORAL | Status: DC
  Administered 2020-11-28: 0.4 mg via ORAL
  Filled 2020-11-27: qty 1

## 2020-11-27 MED ORDER — EPHEDRINE SULFATE-NACL 50-0.9 MG/10ML-% IV SOSY
PREFILLED_SYRINGE | INTRAVENOUS | Status: DC | PRN
  Administered 2020-11-27: 5 mg via INTRAVENOUS
  Administered 2020-11-27: 10 mg via INTRAVENOUS

## 2020-11-27 MED ORDER — SODIUM CHLORIDE (PF) 0.9 % IJ SOLN
INTRAMUSCULAR | Status: AC
  Filled 2020-11-27: qty 30

## 2020-11-27 SURGICAL SUPPLY — 72 items
BAG ZIPLOCK 12X15 (MISCELLANEOUS) IMPLANT
BATTERY INSTRU NAVIGATION (MISCELLANEOUS) ×6 IMPLANT
BEARING TIBIA INSRT KNEE SZ4 9 (Insert) ×1 IMPLANT
BLADE SAW RECIPROCATING 77.5 (BLADE) ×2 IMPLANT
BNDG ELASTIC 4X5.8 VLCR STR LF (GAUZE/BANDAGES/DRESSINGS) ×2 IMPLANT
BNDG ELASTIC 6X5.8 VLCR STR LF (GAUZE/BANDAGES/DRESSINGS) ×2 IMPLANT
CHLORAPREP W/TINT 26 (MISCELLANEOUS) ×4 IMPLANT
COMPONENT TRI CR FEM SZ5 KNEE (Orthopedic Implant) ×1 IMPLANT
COVER SURGICAL LIGHT HANDLE (MISCELLANEOUS) ×2 IMPLANT
COVER WAND RF STERILE (DRAPES) IMPLANT
CUFF TOURN SGL QUICK 34 (TOURNIQUET CUFF) ×2
CUFF TRNQT CYL 34X4.125X (TOURNIQUET CUFF) ×1 IMPLANT
DECANTER SPIKE VIAL GLASS SM (MISCELLANEOUS) ×4 IMPLANT
DERMABOND ADVANCED (GAUZE/BANDAGES/DRESSINGS) ×1
DERMABOND ADVANCED .7 DNX12 (GAUZE/BANDAGES/DRESSINGS) ×1 IMPLANT
DRAPE SHEET LG 3/4 BI-LAMINATE (DRAPES) ×6 IMPLANT
DRAPE U-SHAPE 47X51 STRL (DRAPES) ×2 IMPLANT
DRSG AQUACEL AG ADV 3.5X10 (GAUZE/BANDAGES/DRESSINGS) ×2 IMPLANT
DRSG TEGADERM 4X4.75 (GAUZE/BANDAGES/DRESSINGS) IMPLANT
ELECT BLADE TIP CTD 4 INCH (ELECTRODE) ×2 IMPLANT
ELECT REM PT RETURN 15FT ADLT (MISCELLANEOUS) ×2 IMPLANT
EVACUATOR 1/8 PVC DRAIN (DRAIN) IMPLANT
GAUZE SPONGE 4X4 12PLY STRL (GAUZE/BANDAGES/DRESSINGS) ×2 IMPLANT
GLOVE SRG 8 PF TXTR STRL LF DI (GLOVE) ×2 IMPLANT
GLOVE SURG ENC MOIS LTX SZ8.5 (GLOVE) ×4 IMPLANT
GLOVE SURG ENC TEXT LTX SZ7.5 (GLOVE) ×6 IMPLANT
GLOVE SURG UNDER POLY LF SZ8 (GLOVE) ×4
GLOVE SURG UNDER POLY LF SZ8.5 (GLOVE) ×2 IMPLANT
GOWN SPEC L3 XXLG W/TWL (GOWN DISPOSABLE) ×2 IMPLANT
GOWN SPEC L4 XLG W/TWL (GOWN DISPOSABLE) ×2 IMPLANT
HANDPIECE INTERPULSE COAX TIP (DISPOSABLE) ×2
HOLDER FOLEY CATH W/STRAP (MISCELLANEOUS) ×2 IMPLANT
HOOD PEEL AWAY FLYTE STAYCOOL (MISCELLANEOUS) ×6 IMPLANT
JET LAVAGE IRRISEPT WOUND (IRRIGATION / IRRIGATOR) ×2
KIT TURNOVER KIT A (KITS) ×2 IMPLANT
KNEE PATELLA ASYMMETRIC 10X35 (Knees) ×2 IMPLANT
KNEE TIBIAL COMP TRI SZ4 (Knees) ×2 IMPLANT
LAVAGE JET IRRISEPT WOUND (IRRIGATION / IRRIGATOR) ×1 IMPLANT
MARKER SKIN DUAL TIP RULER LAB (MISCELLANEOUS) ×2 IMPLANT
NDL SAFETY ECLIPSE 18X1.5 (NEEDLE) ×1 IMPLANT
NEEDLE HYPO 18GX1.5 SHARP (NEEDLE) ×2
NEEDLE SPNL 18GX3.5 QUINCKE PK (NEEDLE) ×2 IMPLANT
NS IRRIG 1000ML POUR BTL (IV SOLUTION) ×2 IMPLANT
PACK TOTAL KNEE CUSTOM (KITS) ×2 IMPLANT
PADDING CAST COTTON 6X4 STRL (CAST SUPPLIES) ×2 IMPLANT
PENCIL SMOKE EVACUATOR (MISCELLANEOUS) IMPLANT
PIN FLUTED HEDLESS FIX 3.5X1/8 (PIN) ×2 IMPLANT
PROTECTOR NERVE ULNAR (MISCELLANEOUS) ×2 IMPLANT
SAW OSC TIP CART 19.5X105X1.3 (SAW) ×2 IMPLANT
SEALER BIPOLAR AQUA 6.0 (INSTRUMENTS) ×2 IMPLANT
SET HNDPC FAN SPRY TIP SCT (DISPOSABLE) ×1 IMPLANT
SET PAD KNEE POSITIONER (MISCELLANEOUS) ×2 IMPLANT
SPONGE DRAIN TRACH 4X4 STRL 2S (GAUZE/BANDAGES/DRESSINGS) IMPLANT
STAPLER VISISTAT 35W (STAPLE) ×2 IMPLANT
SUT MNCRL AB 3-0 PS2 18 (SUTURE) ×2 IMPLANT
SUT MNCRL AB 4-0 PS2 18 (SUTURE) ×2 IMPLANT
SUT MON AB 2-0 CT1 36 (SUTURE) ×2 IMPLANT
SUT STRATAFIX PDO 1 14 VIOLET (SUTURE) ×2
SUT STRATFX PDO 1 14 VIOLET (SUTURE) ×1
SUT VIC AB 1 CTX 36 (SUTURE) ×4
SUT VIC AB 1 CTX36XBRD ANBCTR (SUTURE) ×2 IMPLANT
SUT VIC AB 2-0 CT1 27 (SUTURE) ×2
SUT VIC AB 2-0 CT1 TAPERPNT 27 (SUTURE) ×1 IMPLANT
SUTURE STRATFX PDO 1 14 VIOLET (SUTURE) ×1 IMPLANT
SYR 3ML LL SCALE MARK (SYRINGE) ×2 IMPLANT
TIBIA BEAR INSERT KNEE SZ4 9 (Insert) ×2 IMPLANT
TOWER CARTRIDGE SMART MIX (DISPOSABLE) IMPLANT
TRAY FOLEY MTR SLVR 16FR STAT (SET/KITS/TRAYS/PACK) ×2 IMPLANT
TRI CRUC RET FEM SZ5 KNEE (Orthopedic Implant) ×2 IMPLANT
TUBE SUCTION HIGH CAP CLEAR NV (SUCTIONS) ×2 IMPLANT
WATER STERILE IRR 1000ML POUR (IV SOLUTION) ×4 IMPLANT
WRAP KNEE MAXI GEL POST OP (GAUZE/BANDAGES/DRESSINGS) ×2 IMPLANT

## 2020-11-27 NOTE — Anesthesia Procedure Notes (Signed)
Spinal  Patient location during procedure: OR End time: 11/27/2020 11:06 AM Staffing Performed: resident/CRNA  Anesthesiologist: Pervis Hocking, DO Resident/CRNA: Maxwell Caul, CRNA Preanesthetic Checklist Completed: patient identified, IV checked, site marked, risks and benefits discussed, surgical consent, monitors and equipment checked, pre-op evaluation and timeout performed Spinal Block Patient position: sitting Prep: DuraPrep Patient monitoring: heart rate, cardiac monitor, continuous pulse ox and blood pressure Approach: midline Location: L3-4 Injection technique: single-shot Needle Needle type: Pencan  Needle gauge: 24 G Needle length: 10 cm Assessment Sensory level: T4 Additional Notes IV functioning, monitors applied to pt. Expiration date of kit checked and confirmed to be in date. Sterile prep and drape, hand hygiene and sterile gloved used. Pt was positioned and spine was prepped in sterile fashion. Skin was anesthetized with lidocaine. Free flow of clear CSF obtained prior to injecting local anesthetic into CSF x 1 attempt. Spinal needle aspirated freely following injection. Needle was carefully withdrawn, and pt tolerated procedure well. Loss of motor and sensory on exam post injection. Dr Doroteo Glassman at bedside during entire placement.

## 2020-11-27 NOTE — Transfer of Care (Signed)
Immediate Anesthesia Transfer of Care Note  Patient: Virgina Organ Confer  Procedure(s) Performed: COMPUTER ASSISTED TOTAL KNEE ARTHROPLASTY (Right Knee)  Patient Location: PACU  Anesthesia Type:Spinal  Level of Consciousness: awake, alert  and oriented  Airway & Oxygen Therapy: Patient Spontanous Breathing and Patient connected to face mask oxygen  Post-op Assessment: Report given to RN and Post -op Vital signs reviewed and stable  Post vital signs: Reviewed and stable  Last Vitals:  Vitals Value Taken Time  BP 115/102 11/27/20 1332  Temp    Pulse 76 11/27/20 1334  Resp 19 11/27/20 1334  SpO2 100 % 11/27/20 1334  Vitals shown include unvalidated device data.  Last Pain:  Vitals:   11/27/20 0934  TempSrc:   PainSc: 0-No pain      Patients Stated Pain Goal: 4 (11/27/20 0830)  Complications: No complications documented.

## 2020-11-27 NOTE — Op Note (Signed)
OPERATIVE REPORT  SURGEON: Rod Can, MD   ASSISTANT: Cherlynn June, PA-C  PREOPERATIVE DIAGNOSIS: Right knee arthritis.   POSTOPERATIVE DIAGNOSIS: Right knee arthritis.   PROCEDURE: Right total knee arthroplasty.   IMPLANTS: Stryker Triathlon CR femur, size 5. Stryker Tritanium tibia, size 4. X3 polyethelyene insert, size 9 mm, CR. 3 button asymmetric patella, size 35 mm.  ANESTHESIA:  MAC, Regional and Spinal  TOURNIQUET TIME: Not utilized.   ESTIMATED BLOOD LOSS:-200 mL    ANTIBIOTICS: 2g Ancef.  DRAINS: None.  COMPLICATIONS: None   CONDITION: PACU - hemodynamically stable.   BRIEF CLINICAL NOTE: Roger Mcdaniel is a 70 y.o. male with a long-standing history of Right knee arthritis. After failing conservative management, the patient was indicated for total knee arthroplasty. The risks, benefits, and alternatives to the procedure were explained, and the patient elected to proceed.  PROCEDURE IN DETAIL: Adductor canal block was obtained in the pre-op holding area. Once inside the operative room, spinal anesthesia was obtained, and a foley catheter was inserted. The patient was then positioned, a nonsterile tourniquet was placed, and the lower extremity was prepped and draped in the normal sterile surgical fashion.  A time-out was called verifying side and site of surgery. The patient received IV antibiotics within 60 minutes of beginning the procedure. The tourniquet was not utilized.   An anterior approach to the knee was performed utilizing a midvastus arthrotomy. A medial release was performed and the patellar fat pad was excised. Stryker navigation was used to cut the distal femur perpendicular to the mechanical axis. A freehand patellar resection was performed, and the patella was sized an prepared with 3 lug holes.  Nagivation was used to make a neutral proximal tibia resection, taking 4 mm of bone from the  less affected medial side with 3 degrees of slope. The menisci were excised. A spacer block was placed, and the alignment and balance in extension were confirmed.   The distal femur was sized using the 3-degree external rotation guide referencing the posterior femoral cortex. The appropriate 4-in-1 cutting block was pinned into place. Rotation was checked using Whiteside's line, the epicondylar axis, and then confirmed with a spacer block in flexion. The remaining femoral cuts were performed, taking care to protect the MCL.  The tibia was sized and the trial tray was pinned into place. The remaining trail components were inserted. The knee was stable to varus and valgus stress through a full range of motion. The patella tracked centrally, and the PCL was well balanced. The trial components were removed, and the proximal tibial surface was prepared. Final components were impacted into place. The knee was tested for a final time and found to be well balanced.   The wound was copiously irrigated with Irrisept solution and normal saline using pule lavage.  Marcaine solution was injected into the periarticular soft tissue.  The wound was closed in layers using #1 Vicryl and Stratafix for the fascia, 2-0 Vicryl for the subcutaneous fat, 2-0 Monocryl for the deep dermal layer, 3-0 running Monocryl subcuticular Stitch, and 4-0 Monocryl stay sutures at both ends of the wound. Dermabond was applied to the skin.  Once the glue was fully dried, an Aquacell Ag and compressive dressing were applied.  Tthe patient was transported to the recovery room in stable condition.  Sponge, needle, and instrument counts were correct at the end of the case x2.  The patient tolerated the procedure well and there were no known complications.  Please note that a  surgical assistant was a medical necessity for this procedure in order to perform it in a safe and expeditious manner. Surgical assistant was necessary to retract the ligaments  and vital neurovascular structures to prevent injury to them and also necessary for proper positioning of the limb to allow for anatomic placement of the prosthesis.

## 2020-11-27 NOTE — Care Plan (Signed)
Ortho Bundle Case Management Note  Patient Details  Name: WATT GEILER MRN: [id number] Date of Birth: 10-27-1950                  R TKA on 11/27/20. DCP: Home with granddaughter. 1 story home with ramp entrance. DME: Borrowing RW, cane, & CTU. Doesn't want 3in1. PT: EO 3/7   DME Arranged:  N/A DME Agency:     HH Arranged:    HH Agency:     Additional Comments: Please contact me with any questions of if this plan should need to change.  Ennis Forts, RN,CCM [facility]  [phone] 11/27/2020, 8:45 AM

## 2020-11-27 NOTE — Anesthesia Procedure Notes (Signed)
Procedure Name: MAC Date/Time: 11/27/2020 10:58 AM Performed by: Maxwell Caul, CRNA Pre-anesthesia Checklist: Patient identified, Emergency Drugs available, Suction available and Patient being monitored Oxygen Delivery Method: Simple face mask

## 2020-11-27 NOTE — Progress Notes (Signed)
AssistedDr. Ellender with right, ultrasound guided, adductor canal block. Side rails up, monitors on throughout procedure. See vital signs in flow sheet. Tolerated Procedure well.  

## 2020-11-27 NOTE — Anesthesia Procedure Notes (Signed)
Anesthesia Regional Block: Adductor canal block   Pre-Anesthetic Checklist: ,, timeout performed, Correct Patient, Correct Site, Correct Laterality, Correct Procedure,, site marked, risks and benefits discussed, Surgical consent,  Pre-op evaluation,  At surgeon's request and post-op pain management  Laterality: Right  Prep: chloraprep       Needles:  Injection technique: Single-shot  Needle Type: Echogenic Stimulator Needle     Needle Length: 10cm  Needle Gauge: 20     Additional Needles:   Procedures:,,,, ultrasound used (permanent image in chart),,,,  Narrative:  Start time: 11/27/2020 9:25 AM End time: 11/27/2020 12:35 AM Injection made incrementally with aspirations every 5 mL.  Performed by: Personally  Anesthesiologist: Leonides Grills, MD  Additional Notes: Functioning IV was confirmed and monitors were applied. A time-out was performed. Hand hygiene and sterile gloves were used. The thigh was placed in a frog-leg position and prepped in a sterile fashion. A 20ga BBraun echogenic stimulator needle was placed using ultrasound guidance.  Negative aspiration and negative test dose prior to incremental administration of local anesthetic. The patient tolerated the procedure well.

## 2020-11-27 NOTE — Plan of Care (Signed)
  Problem: Pain Management: Goal: Pain level will decrease with appropriate interventions Outcome: Progressing   Problem: Education: Goal: Knowledge of General Education information will improve Description: Including pain rating scale, medication(s)/side effects and non-pharmacologic comfort measures Outcome: Progressing   Problem: Pain Managment: Goal: General experience of comfort will improve Outcome: Progressing   

## 2020-11-27 NOTE — Discharge Instructions (Signed)
° °Dr. Arcenio Mullaly °Total Joint Specialist °[facility] °[address] °[place], [place] [address] °[phone] ° °TOTAL KNEE REPLACEMENT POSTOPERATIVE DIRECTIONS ° ° ° °Knee Rehabilitation, Guidelines Following Surgery  °Results after knee surgery are often greatly improved when you follow the exercise, range of motion and muscle strengthening exercises prescribed by your doctor. Safety measures are also important to protect the knee from further injury. Any time any of these exercises cause you to have increased pain or swelling in your knee joint, decrease the amount until you are comfortable again and slowly increase them. If you have problems or questions, call your caregiver or physical therapist for advice.  ° °WEIGHT BEARING °Weight bearing as tolerated with assist device (walker, cane, etc) as directed, use it as long as suggested by your surgeon or therapist, typically at least 4-6 weeks. ° °HOME CARE INSTRUCTIONS  °Remove items at home which could result in a fall. This includes throw rugs or furniture in walking pathways.  °Continue medications as instructed at time of discharge. °You may have some home medications which will be placed on hold until you complete the course of blood thinner medication.  °You may start showering once you are discharged home but do not submerge the incision under water. Just pat the incision dry and apply a dry gauze dressing on daily. °Walk with walker as instructed.  °You may resume a sexual relationship in one month or when given the OK by your doctor.  °· Use walker as long as suggested by your caregivers. °· Avoid periods of inactivity such as sitting longer than an hour when not asleep. This helps prevent blood clots.  °You may put full weight on your legs and walk as much as is comfortable.  °You may return to work once you are cleared by your doctor.  °Do not drive a car for 6 weeks or until released by you surgeon.  °· Do not drive  while taking narcotics.  °Wear the elastic stockings for three weeks following surgery during the day but you may remove then at night. °Make sure you keep all of your appointments after your operation with all of your doctors and caregivers. You should call the office at the above phone number and make an appointment for approximately two weeks after the date of your surgery. °Do not remove your surgical dressing. The dressing is waterproof; you may take showers in 3 days, but do not take tub baths or submerge the dressing. °Please pick up a stool softener and laxative for home use as long as you are requiring pain medications. °· ICE to the affected knee every three hours for 30 minutes at a time and then as needed for pain and swelling.  Continue to use ice on the knee for pain and swelling from surgery. You may notice swelling that will progress down to the foot and ankle.  This is normal after surgery.  Elevate the leg when you are not up walking on it.   °It is important for you to complete the blood thinner medication as prescribed by your doctor. °· Continue to use the breathing machine which will help keep your temperature down.  It is common for your temperature to cycle up and down following surgery, especially at night when you are not up moving around and exerting yourself.  The breathing machine keeps your lungs expanded and your temperature down. ° °RANGE OF MOTION AND STRENGTHENING EXERCISES  °Rehabilitation of the knee is important following   a knee injury or an operation. After just a few days of immobilization, the muscles of the thigh which control the knee become weakened and shrink (atrophy). Knee exercises are designed to build up the tone and strength of the thigh muscles and to improve knee motion. Often times heat used for twenty to thirty minutes before working out will loosen up your tissues and help with improving the range of motion but do not use heat for the first two weeks following  surgery. These exercises can be done on a training (exercise) mat, on the floor, on a table or on a bed. Use what ever works the best and is most comfortable for you Knee exercises include:  °Leg Lifts - While your knee is still immobilized in a splint or cast, you can do straight leg raises. Lift the leg to 60 degrees, hold for 3 sec, and slowly lower the leg. Repeat 10-20 times 2-3 times daily. Perform this exercise against resistance later as your knee gets better.  °Quad and Hamstring Sets - Tighten up the muscle on the front of the thigh (Quad) and hold for 5-10 sec. Repeat this 10-20 times hourly. Hamstring sets are done by pushing the foot backward against an object and holding for 5-10 sec. Repeat as with quad sets.  °A rehabilitation program following serious knee injuries can speed recovery and prevent re-injury in the future due to weakened muscles. Contact your doctor or a physical therapist for more information on knee rehabilitation.  ° °SKILLED REHAB INSTRUCTIONS: °If the patient is transferred to a skilled rehab facility following release from the hospital, a list of the current medications will be sent to the facility for the patient to continue.  When discharged from the skilled rehab facility, please have the facility set up the patient's Home Health Physical Therapy prior to being released. Also, the skilled facility will be responsible for providing the patient with their medications at time of release from the facility to include their pain medication, the muscle relaxants, and their blood thinner medication. If the patient is still at the rehab facility at time of the two week follow up appointment, the skilled rehab facility will also need to assist the patient in arranging follow up appointment in our office and any transportation needs. ° °MAKE SURE YOU:  °Understand these instructions.  °Will watch your condition.  °Will get help right away if you are not doing well or get worse.   ° ° °Pick up stool softner and laxative for home use following surgery while on pain medications. °Do NOT remove your dressing. You may shower.  °Do not take tub baths or submerge incision under water. °May shower starting three days after surgery. °Please use a clean towel to pat the incision dry following showers. °Continue to use ice for pain and swelling after surgery. °Do not use any lotions or creams on the incision until instructed by your surgeon. ° °

## 2020-11-27 NOTE — Anesthesia Preprocedure Evaluation (Addendum)
Anesthesia Evaluation  Patient identified by MRN, date of birth, ID band Patient awake    Reviewed: Allergy & Precautions, NPO status , Patient's Chart, lab work & pertinent test results  Airway Mallampati: II  TM Distance: >3 FB Neck ROM: Full    Dental  (+) Missing   Pulmonary    Pulmonary exam normal breath sounds clear to auscultation       Cardiovascular hypertension, Pt. on medications Normal cardiovascular exam Rhythm:Regular Rate:Normal  ECG: SR, rate 60   Neuro/Psych negative neurological ROS  negative psych ROS   GI/Hepatic negative GI ROS, Neg liver ROS,   Endo/Other  diabetes, Oral Hypoglycemic Agents  Renal/GU negative Renal ROS     Musculoskeletal  (+) Arthritis ,   Abdominal (+) + obese,   Peds  Hematology HLD   Anesthesia Other Findings right knee degenerative joint disease  Reproductive/Obstetrics                            Anesthesia Physical Anesthesia Plan  ASA: III  Anesthesia Plan: Spinal and Regional   Post-op Pain Management:  Regional for Post-op pain   Induction: Intravenous  PONV Risk Score and Plan: 1 and Ondansetron, Dexamethasone, Midazolam, Propofol infusion and Treatment may vary due to age or medical condition  Airway Management Planned: Simple Face Mask  Additional Equipment:   Intra-op Plan:   Post-operative Plan:   Informed Consent: I have reviewed the patients History and Physical, chart, labs and discussed the procedure including the risks, benefits and alternatives for the proposed anesthesia with the patient or authorized representative who has indicated his/her understanding and acceptance.     Dental advisory given  Plan Discussed with: CRNA  Anesthesia Plan Comments:         Anesthesia Quick Evaluation

## 2020-11-27 NOTE — Anesthesia Postprocedure Evaluation (Signed)
Anesthesia Post Note  Patient: Roger Mcdaniel  Procedure(s) Performed: COMPUTER ASSISTED TOTAL KNEE ARTHROPLASTY (Right Knee)     Patient location during evaluation: PACU Anesthesia Type: Regional, MAC and Spinal Level of consciousness: awake and alert Pain management: pain level controlled Vital Signs Assessment: post-procedure vital signs reviewed and stable Respiratory status: spontaneous breathing, nonlabored ventilation and respiratory function stable Cardiovascular status: blood pressure returned to baseline and stable Postop Assessment: no apparent nausea or vomiting, no headache, no backache and spinal receding Anesthetic complications: no   No complications documented.  Last Vitals:  Vitals:   11/27/20 1424 11/27/20 1430  BP:  135/66  Pulse: 66 62  Resp: 16 17  Temp:    SpO2: 100% 100%    Last Pain:  Vitals:   11/27/20 1414  TempSrc:   PainSc: 0-No pain    LLE Motor Response: Non-purposeful movement (11/27/20 1425) LLE Sensation: Decreased (11/27/20 1425) RLE Motor Response: Non-purposeful movement (11/27/20 1425) RLE Sensation: Decreased (11/27/20 1425)      Pervis Hocking

## 2020-11-27 NOTE — Interval H&P Note (Signed)
History and Physical Interval Note:  11/27/2020 9:53 AM  Roger Mcdaniel  has presented today for surgery, with the diagnosis of right knee degenerative joint disease.  The various methods of treatment have been discussed with the patient and family. After consideration of risks, benefits and other options for treatment, the patient has consented to  Procedure(s): COMPUTER ASSISTED TOTAL KNEE ARTHROPLASTY (Right) as a surgical intervention.  The patient's history has been reviewed, patient examined, no change in status, stable for surgery.  I have reviewed the patient's chart and labs.  Questions were answered to the patient's satisfaction.     Roger Mcdaniel

## 2020-11-28 ENCOUNTER — Encounter (HOSPITAL_COMMUNITY): Payer: Self-pay | Admitting: Orthopedic Surgery

## 2020-11-28 DIAGNOSIS — Z7984 Long term (current) use of oral hypoglycemic drugs: Secondary | ICD-10-CM | POA: Diagnosis not present

## 2020-11-28 DIAGNOSIS — Z79899 Other long term (current) drug therapy: Secondary | ICD-10-CM | POA: Diagnosis not present

## 2020-11-28 DIAGNOSIS — M1711 Unilateral primary osteoarthritis, right knee: Secondary | ICD-10-CM | POA: Diagnosis not present

## 2020-11-28 DIAGNOSIS — Z7982 Long term (current) use of aspirin: Secondary | ICD-10-CM | POA: Diagnosis not present

## 2020-11-28 LAB — BASIC METABOLIC PANEL
Anion gap: 6 (ref 5–15)
BUN: 18 mg/dL (ref 8–23)
CO2: 25 mmol/L (ref 22–32)
Calcium: 8.2 mg/dL — ABNORMAL LOW (ref 8.9–10.3)
Chloride: 104 mmol/L (ref 98–111)
Creatinine, Ser: 0.95 mg/dL (ref 0.61–1.24)
GFR, Estimated: >60 mL/min (ref >60)
Glucose, Bld: 174 mg/dL — ABNORMAL HIGH (ref 70–99)
Potassium: 4.4 mmol/L (ref 3.5–5.1)
Sodium: 135 mmol/L (ref 135–145)

## 2020-11-28 LAB — GLUCOSE, CAPILLARY
Glucose-Capillary: 102 mg/dL — ABNORMAL HIGH (ref 70–99)
Glucose-Capillary: 114 mg/dL — ABNORMAL HIGH (ref 70–99)

## 2020-11-28 LAB — CBC
HCT: 33.3 % — ABNORMAL LOW (ref 39.0–52.0)
Hemoglobin: 11.2 g/dL — ABNORMAL LOW (ref 13.0–17.0)
MCH: 30.7 pg (ref 26.0–34.0)
MCHC: 33.6 g/dL (ref 30.0–36.0)
MCV: 91.2 fL (ref 80.0–100.0)
Platelets: 246 10*3/uL (ref 150–400)
RBC: 3.65 MIL/uL — ABNORMAL LOW (ref 4.22–5.81)
RDW: 12.4 % (ref 11.5–15.5)
WBC: 12.1 10*3/uL — ABNORMAL HIGH (ref 4.0–10.5)
nRBC: 0 % (ref 0.0–0.2)

## 2020-11-28 MED ORDER — HYDROCODONE-ACETAMINOPHEN 5-325 MG PO TABS: 1.0000 | ORAL_TABLET | ORAL | 0 refills | Status: DC | PRN

## 2020-11-28 MED ORDER — ASPIRIN 81 MG PO CHEW: 81.0000 mg | CHEWABLE_TABLET | Freq: Two times a day (BID) | ORAL | 0 refills | Status: AC

## 2020-11-28 MED ORDER — DOCUSATE SODIUM 100 MG PO CAPS: 100.0000 mg | ORAL_CAPSULE | Freq: Two times a day (BID) | ORAL | 1 refills | Status: DC

## 2020-11-28 MED ORDER — ONDANSETRON HCL 4 MG PO TABS: 4.0000 mg | ORAL_TABLET | Freq: Four times a day (QID) | ORAL | 0 refills | Status: DC | PRN

## 2020-11-28 MED ORDER — SENNA 8.6 MG PO TABS: 2.0000 | ORAL_TABLET | Freq: Every day | ORAL | 1 refills | Status: DC

## 2020-11-28 NOTE — Evaluation (Signed)
Physical Therapy Evaluation Patient Details Name: Roger Mcdaniel MRN: [id number] DOB: 16-May-1951 Today's Date: 11/28/2020   History of Present Illness  Pt s/p R TKR and with hx of DM and DDD  Clinical Impression  Pt s/p R TKR and presents with decreased R LE strength/ROM and post op pain limiting functional mobility.  Pt should progress to dc home with family assist and has first OP PT scheduled for MOnday 12/01/20.    Follow Up Recommendations Follow surgeon's recommendation for DC plan and follow-up therapies    Equipment Recommendations  None recommended by PT    Recommendations for Other Services       Precautions / Restrictions Precautions Precautions: Knee;Fall Restrictions Weight Bearing Restrictions: No RLE Weight Bearing: Weight bearing as tolerated      Mobility  Bed Mobility Overal bed mobility: Modified Independent             General bed mobility comments: no physical assist supine<>sit    Transfers Overall transfer level: Needs assistance   Transfers: Sit to/from Stand Sit to Stand: Min guard;Supervision         General transfer comment: cues for LE management and use of UEs to self assist  Ambulation/Gait Ambulation/Gait assistance: Min guard;Supervision Gait Distance (Feet): 150 Feet Assistive device: Rolling walker (2 wheeled) Gait Pattern/deviations: Step-to pattern;Step-through pattern;Decreased step length - right;Decreased step length - left;Shuffle;Trunk flexed Gait velocity: decr   General Gait Details: cues for posture, position from RW and sequence  Stairs Stairs: Yes Stairs assistance: Min guard Stair Management: No rails;Step to pattern;Backwards;Forwards;With walker Number of Stairs: 4 General stair comments: twice fwd and twice bkwd single step with RW and cues for sequence  Wheelchair Mobility    Modified Rankin (Stroke Patients Only)       Balance Overall balance assessment: Mild deficits observed, not formally  tested                                           Pertinent Vitals/Pain Pain Assessment: 0-10 Pain Score: 4  Pain Location: R knee Pain Descriptors / Indicators: Aching;Sore Pain Intervention(s): Limited activity within patient's tolerance;Monitored during session;Premedicated before session;Ice applied    Home Living Family/patient expects to be discharged to:: Private residence Living Arrangements: Other relatives Available Help at Discharge: Family Type of Home: House Home Access: Stairs to enter   Secretary/administrator of Steps: 1 Home Layout: One level Home Equipment: Environmental consultant - 2 wheels;Cane - single point;Shower seat      Prior Function Level of Independence: Independent               Hand Dominance        Extremity/Trunk Assessment   Upper Extremity Assessment Upper Extremity Assessment: Overall WFL for tasks assessed    Lower Extremity Assessment Lower Extremity Assessment: RLE deficits/detail RLE Deficits / Details: 3/5 quads with IND SLR; AAROM at knee -5 - 95    Cervical / Trunk Assessment Cervical / Trunk Assessment: Normal  Communication   Communication: No difficulties  Cognition Arousal/Alertness: Awake/alert Behavior During Therapy: WFL for tasks assessed/performed Overall Cognitive Status: Within Functional Limits for tasks assessed                                        General Comments  Exercises Total Joint Exercises Ankle Circles/Pumps: AROM;Both;20 reps;Supine Quad Sets: AROM;Both;10 reps;Supine Heel Slides: AAROM;Right;15 reps;Supine Straight Leg Raises: AAROM;AROM;Right;15 reps;Supine Long Arc Quad: AROM;Right;10 reps;Seated   Assessment/Plan    PT Assessment Patient needs continued PT services  PT Problem List Decreased strength;Decreased range of motion;Decreased activity tolerance;Decreased balance;Decreased knowledge of use of DME;Pain       PT Treatment Interventions DME  instruction;Gait training;Stair training;Functional mobility training;Therapeutic activities;Therapeutic exercise;Patient/family education    PT Goals (Current goals can be found in the Care Plan section)  Acute Rehab PT Goals Patient Stated Goal: Regain IND PT Goal Formulation: All assessment and education complete, DC therapy    Frequency Min 1X/week   Barriers to discharge        Co-evaluation               AM-PAC PT "6 Clicks" Mobility  Outcome Measure Help needed turning from your back to your side while in a flat bed without using bedrails?: None Help needed moving from lying on your back to sitting on the side of a flat bed without using bedrails?: None Help needed moving to and from a bed to a chair (including a wheelchair)?: A Little Help needed standing up from a chair using your arms (e.g., wheelchair or bedside chair)?: A Little Help needed to walk in hospital room?: A Little Help needed climbing 3-5 steps with a railing? : A Little 6 Click Score: 20    End of Session Equipment Utilized During Treatment: Gait belt Activity Tolerance: Patient tolerated treatment well Patient left: in chair;with call bell/phone within reach Nurse Communication: Mobility status PT Visit Diagnosis: Unsteadiness on feet (R26.81);Difficulty in walking, not elsewhere classified (R26.2)    Time: 3149-7026 PT Time Calculation (min) (ACUTE ONLY): 54 min   Charges:   PT Evaluation $PT Eval Low Complexity: 1 Low PT Treatments $Gait Training: 8-22 mins $Therapeutic Exercise: 8-22 mins        Mauro Kaufmann PT Acute Rehabilitation Services Pager [phone] Office [phone]   Roger Mcdaniel,Roger Mcdaniel 11/28/2020, 11:31 AM

## 2020-11-28 NOTE — Progress Notes (Signed)
Met briefly with this gentleman who is part of [facility].  Confirming he has all needed DME at home.  Plan for OP at [facility].  No TOC needs.  Dametra Whetsel, LCSW

## 2020-11-28 NOTE — Discharge Summary (Signed)
Physician Discharge Summary  Patient ID: Roger Mcdaniel MRN: [id number] DOB/AGE: 69-Sep-1952 69 y.o.  Admit date: 11/27/2020 Discharge date: 11/28/2020  Admission Diagnoses:  Degenerative joint disease of right knee  Discharge Diagnoses:  Principal Problem:   Degenerative joint disease of right knee   Past Medical History:  Diagnosis Date  . Bruises easily   . Chest pain   . CMC arthritis, thumb, degenerative   . DDD (degenerative disc disease), cervical 2015   s/o ACDF ([place])  . DM type 2 (diabetes mellitus, type 2) ([place])   . Dry skin   . GERD 07/10/2008   Qualifier: Diagnosis of  By: Council Mechanic MD, Hilaria Ota   . GERD (gastroesophageal reflux disease) 09/2003  . History of ETT 05/23/2000   Nml (Dr. Acie Fredrickson)  . History of kidney stones   . HNP (herniated nucleus pulposus), cervical 12/20/2013  . Hyperlipidemia 09/2003  . Hypertension 06/2004  . OSA (obstructive sleep apnea)    was on CPAP, not used  in 25-30 years     Surgeries: Procedure(s): COMPUTER ASSISTED TOTAL KNEE ARTHROPLASTY on 11/27/2020   Consultants (if any):   Discharged Condition: Improved  Hospital Course: Roger Mcdaniel is an 70 y.o. male who was admitted 11/27/2020 with a diagnosis of Degenerative joint disease of right knee and went to the operating room on 11/27/2020 and underwent the above named procedures.    He was given perioperative antibiotics:  Anti-infectives (From admission, onward)   Start     Dose/Rate Route Frequency Ordered Stop   11/27/20 1700  ceFAZolin (ANCEF) IVPB 2g/100 mL premix        2 g 200 mL/hr over 30 Minutes Intravenous Every 6 hours 11/27/20 1613 11/28/20 0013   11/27/20 0830  ceFAZolin (ANCEF) IVPB 2g/100 mL premix        2 g 200 mL/hr over 30 Minutes Intravenous On call to O.R. 11/27/20 0815 11/27/20 1107    .  He was given sequential compression devices, early ambulation, and ASA for DVT prophylaxis.  He benefited maximally from the hospital stay and there were no  complications.    Recent vital signs:  Vitals:   11/28/20 0534 11/28/20 0929  BP: 116/63 (!) 118/53  Pulse: (!) 59 64  Resp: 16 17  Temp: 97.7 F (36.5 C) 98 F (36.7 C)  SpO2: 99% 98%    Recent laboratory studies:  Lab Results  Component Value Date   HGB 11.2 (L) 11/28/2020   HGB 14.4 11/14/2020   HGB 13.9 05/28/2020   Lab Results  Component Value Date   WBC 12.1 (H) 11/28/2020   PLT 246 11/28/2020   Lab Results  Component Value Date   INR 1.0 11/14/2020   Lab Results  Component Value Date   NA 135 11/28/2020   K 4.4 11/28/2020   CL 104 11/28/2020   CO2 25 11/28/2020   BUN 18 11/28/2020   CREATININE 0.95 11/28/2020   GLUCOSE 174 (H) 11/28/2020    Discharge Medications:   Allergies as of 11/28/2020      Reactions   Codeine Sulfate Nausea Only   Pravachol [pravastatin Sodium] Other (See Comments)   Muscle aches      Medication List    STOP taking these medications   aspirin 81 MG tablet Replaced by: aspirin 81 MG chewable tablet     TAKE these medications   aspirin 81 MG chewable tablet Chew 1 tablet (81 mg total) by mouth 2 (two) times daily. Replaces: aspirin 81 MG  tablet   atorvastatin 40 MG tablet Commonly known as: LIPITOR TAKE 1 TABLET DAILY   blood glucose meter kit and supplies Fill One touch meter that is covered. Check sugar once daily. Dx E11.9   docusate sodium 100 MG capsule Commonly known as: COLACE Take 1 capsule (100 mg total) by mouth 2 (two) times daily.   gabapentin 300 MG capsule Commonly known as: NEURONTIN Take 300 mg by mouth 2 (two) times daily.   glucose blood test strip Check sugar once daily Dx E111.9   HYDROcodone-acetaminophen 5-325 MG tablet Commonly known as: NORCO/VICODIN Take 1 tablet by mouth every 4 (four) hours as needed for moderate pain (pain score 4-6).   lisinopril 10 MG tablet Commonly known as: ZESTRIL Take 1 tablet (10 mg total) by mouth daily.   Magnesium 250 MG Tabs Take 250 mg by mouth  daily.   metFORMIN 500 MG tablet Commonly known as: GLUCOPHAGE Take 1 tablet (500 mg total) by mouth 2 (two) times daily with a meal.   ondansetron 4 MG tablet Commonly known as: ZOFRAN Take 1 tablet (4 mg total) by mouth every 6 (six) hours as needed for nausea.   onetouch ultrasoft lancets Check sugar once daily Dx E11.9   senna 8.6 MG Tabs tablet Commonly known as: SENOKOT Take 2 tablets (17.2 mg total) by mouth at bedtime.   tamsulosin 0.4 MG Caps capsule Commonly known as: FLOMAX Take 1 capsule (0.4 mg total) by mouth daily.   vitamin C 500 MG tablet Commonly known as: ASCORBIC ACID Take 1 tablet (500 mg total) by mouth 2 (two) times daily.   zinc gluconate 50 MG tablet Take 50 mg by mouth daily.       Diagnostic Studies: DG Chest 2 View  Result Date: 11/14/2020 CLINICAL DATA:  Preoperative evaluation EXAM: CHEST - 2 VIEW COMPARISON:  01/10/2014 FINDINGS: Normal heart size, mediastinal contours, and pulmonary vascularity. Minimal subsegmental atelectasis at both lung bases. Lungs otherwise clear. Tiny LEFT pleural effusion. No pneumothorax. Prior cervical fusion and prior resection of the distal RIGHT clavicle. IMPRESSION: Bibasilar atelectasis and tiny LEFT pleural effusion. Electronically Signed   By: Roger Mcdaniel M.D.   On: 11/14/2020 17:54   DG Knee Right Port  Result Date: 11/27/2020 CLINICAL DATA:  Status post right knee replacement. EXAM: PORTABLE RIGHT KNEE - 1-2 VIEW COMPARISON:  None. FINDINGS: Right tibial and femoral and patellar components appear to be well situated. Expected postoperative changes are noted in the soft tissues anteriorly. IMPRESSION: Status post right total knee arthroplasty. Electronically Signed   By: Marijo Conception M.D.   On: 11/27/2020 14:12    Disposition: Discharge disposition: 01-Home or Self Care       Discharge Instructions    Call MD / Call 911   Complete by: As directed    If you experience chest pain or shortness of  breath, CALL 911 and be transported to the hospital emergency room.  If you develope a fever above 101 F, pus (white drainage) or increased drainage or redness at the wound, or calf pain, call your surgeon's office.   Constipation Prevention   Complete by: As directed    Drink plenty of fluids.  Prune juice may be helpful.  You may use a stool softener, such as Colace (over the counter) 100 mg twice a day.  Use MiraLax (over the counter) for constipation as needed.   Diet - low sodium heart healthy   Complete by: As directed    Do  not put a pillow under the knee. Place it under the heel.   Complete by: As directed    Driving restrictions   Complete by: As directed    No driving for 6 weeks   Increase activity slowly as tolerated   Complete by: As directed    Lifting restrictions   Complete by: As directed    No lifting for 6 weeks   TED hose   Complete by: As directed    Use stockings (TED hose) for 2 weeks on both leg(s).  You may remove them at night for sleeping.       Follow-up Information    Rod Can, MD. Go on 12/15/2020.   Specialty: Orthopedic Surgery Why: You are scheduled for first post op appointment on Monday March 21st at 3:45pm. Contact information: [address] [place] [address] [phone]                Signed: Hilton Cork Foxx Klarich 11/30/2020, 8:30 AM

## 2020-11-28 NOTE — Progress Notes (Signed)
Patient discharged to home w/ family. Given all belongings, instructions. Verbalized understanding of all instructions. Escorted to pov via w/c. 

## 2020-11-28 NOTE — Progress Notes (Signed)
    Subjective:  Patient reports pain as mild to moderate.  Denies N/V/CP/SOB. No c/o  Objective:   VITALS:   Vitals:   11/27/20 2146 11/28/20 0145 11/28/20 0534 11/28/20 0929  BP: 122/64 (!) 105/50 116/63 (!) 118/53  Pulse: 68 68 (!) 59 64  Resp: 17 17 16 17   Temp: 97.9 F (36.6 C) (!) 97.5 F (36.4 C) 97.7 F (36.5 C) 98 F (36.7 C)  TempSrc: Oral Oral Oral Oral  SpO2: 96% 97% 99% 98%  Weight:      Height:        NAD ABD soft Sensation intact distally Intact pulses distally Dorsiflexion/Plantar flexion intact Incision: dressing C/D/I Compartment soft   Lab Results  Component Value Date   WBC 12.1 (H) 11/28/2020   HGB 11.2 (L) 11/28/2020   HCT 33.3 (L) 11/28/2020   MCV 91.2 11/28/2020   PLT 246 11/28/2020   BMET    Component Value Date/Time   NA 135 11/28/2020 0308   NA 138 12/16/2014 0832   K 4.4 11/28/2020 0308   CL 104 11/28/2020 0308   CO2 25 11/28/2020 0308   GLUCOSE 174 (H) 11/28/2020 0308   BUN 18 11/28/2020 0308   BUN 20 12/16/2014 0832   CREATININE 0.95 11/28/2020 0308   CREATININE 0.99 03/07/2020 1618   CALCIUM 8.2 (L) 11/28/2020 0308   GFRNONAA >60 11/28/2020 0308   GFRAA 98 12/16/2014 0832     Assessment/Plan: 1 Day Post-Op   Active Problems:   Degenerative joint disease of right knee   WBAT with walker DVT ppx: Aspirin, SCDs, TEDS PO pain control PT/OT Dispo: D/C home with OPPT   12/18/2014 Roger Mcdaniel 11/28/2020, 12:12 PM   01/28/2021, MD [phone] [facility] is now [facility]  [place] [address], [address], [place], [place] Phone: [phone] [email or web address] Facebook  [phone]

## 2020-12-01 ENCOUNTER — Ambulatory Visit (HOSPITAL_COMMUNITY)
Admission: RE | Admit: 2020-12-01 | Discharge: 2020-12-01 | Disposition: A | Discharge status: Home / Self Care | Payer: Medicare Other | Source: Ambulatory Visit | Attending: Cardiology | Admitting: Cardiology

## 2020-12-01 ENCOUNTER — Other Ambulatory Visit (HOSPITAL_COMMUNITY): Payer: Self-pay | Admitting: Orthopedic Surgery

## 2020-12-01 ENCOUNTER — Other Ambulatory Visit: Payer: Self-pay

## 2020-12-01 DIAGNOSIS — R2241 Localized swelling, mass and lump, right lower limb: Secondary | ICD-10-CM | POA: Diagnosis not present

## 2020-12-01 DIAGNOSIS — M25561 Pain in right knee: Secondary | ICD-10-CM | POA: Diagnosis not present

## 2020-12-03 DIAGNOSIS — M25561 Pain in right knee: Secondary | ICD-10-CM | POA: Diagnosis not present

## 2020-12-05 DIAGNOSIS — M25611 Stiffness of right shoulder, not elsewhere classified: Secondary | ICD-10-CM | POA: Diagnosis not present

## 2020-12-05 DIAGNOSIS — M25561 Pain in right knee: Secondary | ICD-10-CM | POA: Diagnosis not present

## 2020-12-08 DIAGNOSIS — M25561 Pain in right knee: Secondary | ICD-10-CM | POA: Diagnosis not present

## 2020-12-08 DIAGNOSIS — M25611 Stiffness of right shoulder, not elsewhere classified: Secondary | ICD-10-CM | POA: Diagnosis not present

## 2020-12-09 ENCOUNTER — Other Ambulatory Visit: Payer: Self-pay | Admitting: Family Medicine

## 2020-12-10 DIAGNOSIS — M25561 Pain in right knee: Secondary | ICD-10-CM | POA: Diagnosis not present

## 2020-12-12 DIAGNOSIS — M25561 Pain in right knee: Secondary | ICD-10-CM | POA: Diagnosis not present

## 2020-12-15 DIAGNOSIS — Z471 Aftercare following joint replacement surgery: Secondary | ICD-10-CM | POA: Diagnosis not present

## 2020-12-15 DIAGNOSIS — M25561 Pain in right knee: Secondary | ICD-10-CM | POA: Diagnosis not present

## 2020-12-15 DIAGNOSIS — Z96651 Presence of right artificial knee joint: Secondary | ICD-10-CM | POA: Diagnosis not present

## 2020-12-17 DIAGNOSIS — M25561 Pain in right knee: Secondary | ICD-10-CM | POA: Diagnosis not present

## 2020-12-19 DIAGNOSIS — M25561 Pain in right knee: Secondary | ICD-10-CM | POA: Diagnosis not present

## 2020-12-22 DIAGNOSIS — M25561 Pain in right knee: Secondary | ICD-10-CM | POA: Diagnosis not present

## 2020-12-26 DIAGNOSIS — M25561 Pain in right knee: Secondary | ICD-10-CM | POA: Diagnosis not present

## 2020-12-26 DIAGNOSIS — M25611 Stiffness of right shoulder, not elsewhere classified: Secondary | ICD-10-CM | POA: Diagnosis not present

## 2020-12-29 DIAGNOSIS — M25561 Pain in right knee: Secondary | ICD-10-CM | POA: Diagnosis not present

## 2021-01-02 DIAGNOSIS — M25561 Pain in right knee: Secondary | ICD-10-CM | POA: Diagnosis not present

## 2021-01-05 DIAGNOSIS — M25561 Pain in right knee: Secondary | ICD-10-CM | POA: Diagnosis not present

## 2021-01-09 DIAGNOSIS — M25561 Pain in right knee: Secondary | ICD-10-CM | POA: Diagnosis not present

## 2021-01-12 DIAGNOSIS — Z96651 Presence of right artificial knee joint: Secondary | ICD-10-CM | POA: Diagnosis not present

## 2021-01-12 DIAGNOSIS — Z471 Aftercare following joint replacement surgery: Secondary | ICD-10-CM | POA: Diagnosis not present

## 2021-01-12 DIAGNOSIS — M25561 Pain in right knee: Secondary | ICD-10-CM | POA: Diagnosis not present

## 2021-01-13 ENCOUNTER — Encounter: Payer: Self-pay | Admitting: Family Medicine

## 2021-01-13 DIAGNOSIS — Z23 Encounter for immunization: Secondary | ICD-10-CM | POA: Diagnosis not present

## 2021-01-13 NOTE — Telephone Encounter (Signed)
Updated pt's chart.  

## 2021-03-30 ENCOUNTER — Other Ambulatory Visit: Payer: Self-pay | Admitting: Family Medicine

## 2021-04-01 ENCOUNTER — Encounter: Payer: Self-pay | Admitting: Family Medicine

## 2021-04-02 NOTE — Telephone Encounter (Signed)
Naproxen Last filled:  11/07/20, #180 Last OV:  11/14/20, pre-op eval Next OV:  05/22/21, AWV prt 2

## 2021-04-03 NOTE — Telephone Encounter (Signed)
Received refill request. This was stopped 10/2020. Will refill for now, discuss at OV next month.

## 2021-04-10 DIAGNOSIS — M722 Plantar fascial fibromatosis: Secondary | ICD-10-CM | POA: Insufficient documentation

## 2021-04-22 ENCOUNTER — Other Ambulatory Visit: Payer: Self-pay | Admitting: Family Medicine

## 2021-05-01 DIAGNOSIS — E119 Type 2 diabetes mellitus without complications: Secondary | ICD-10-CM | POA: Diagnosis not present

## 2021-05-01 LAB — HM DIABETES EYE EXAM

## 2021-05-15 ENCOUNTER — Ambulatory Visit: Payer: Medicare Other

## 2021-05-17 ENCOUNTER — Other Ambulatory Visit: Payer: Self-pay | Admitting: Family Medicine

## 2021-05-17 DIAGNOSIS — E1169 Type 2 diabetes mellitus with other specified complication: Secondary | ICD-10-CM

## 2021-05-17 DIAGNOSIS — Z125 Encounter for screening for malignant neoplasm of prostate: Secondary | ICD-10-CM

## 2021-05-17 DIAGNOSIS — E118 Type 2 diabetes mellitus with unspecified complications: Secondary | ICD-10-CM

## 2021-05-18 ENCOUNTER — Other Ambulatory Visit: Payer: Self-pay

## 2021-05-18 ENCOUNTER — Other Ambulatory Visit (INDEPENDENT_AMBULATORY_CARE_PROVIDER_SITE_OTHER): Payer: Medicare Other

## 2021-05-18 ENCOUNTER — Other Ambulatory Visit: Payer: Self-pay | Admitting: Family Medicine

## 2021-05-18 DIAGNOSIS — E1169 Type 2 diabetes mellitus with other specified complication: Secondary | ICD-10-CM | POA: Diagnosis not present

## 2021-05-18 DIAGNOSIS — Z125 Encounter for screening for malignant neoplasm of prostate: Secondary | ICD-10-CM

## 2021-05-18 DIAGNOSIS — E118 Type 2 diabetes mellitus with unspecified complications: Secondary | ICD-10-CM | POA: Diagnosis not present

## 2021-05-18 DIAGNOSIS — E785 Hyperlipidemia, unspecified: Secondary | ICD-10-CM

## 2021-05-18 LAB — LIPID PANEL
Cholesterol: 93 mg/dL (ref 0–200)
HDL: 36 mg/dL — ABNORMAL LOW (ref >39.00)
LDL Cholesterol: 44 mg/dL (ref 0–99)
NonHDL: 56.72
Total CHOL/HDL Ratio: 3
Triglycerides: 66 mg/dL (ref 0.0–149.0)
VLDL: 13.2 mg/dL (ref 0.0–40.0)

## 2021-05-18 LAB — COMPREHENSIVE METABOLIC PANEL
ALT: 14 U/L (ref 0–53)
AST: 17 U/L (ref 0–37)
Albumin: 4 g/dL (ref 3.5–5.2)
Alkaline Phosphatase: 91 U/L (ref 39–117)
BUN: 18 mg/dL (ref 6–23)
CO2: 29 mEq/L (ref 19–32)
Calcium: 9.4 mg/dL (ref 8.4–10.5)
Chloride: 101 mEq/L (ref 96–112)
Creatinine, Ser: 1.14 mg/dL (ref 0.40–1.50)
GFR: 65.89 mL/min (ref >60.00)
Glucose, Bld: 99 mg/dL (ref 70–99)
Potassium: 5.1 mEq/L (ref 3.5–5.1)
Sodium: 137 mEq/L (ref 135–145)
Total Bilirubin: 1 mg/dL (ref 0.2–1.2)
Total Protein: 6.3 g/dL (ref 6.0–8.3)

## 2021-05-18 LAB — PSA, MEDICARE: PSA: 1.26 ng/ml (ref 0.10–4.00)

## 2021-05-18 LAB — HEMOGLOBIN A1C: Hgb A1c MFr Bld: 6.4 % (ref 4.6–6.5)

## 2021-05-19 ENCOUNTER — Encounter: Payer: Self-pay | Admitting: Family Medicine

## 2021-05-19 DIAGNOSIS — Z96651 Presence of right artificial knee joint: Secondary | ICD-10-CM | POA: Diagnosis not present

## 2021-05-22 ENCOUNTER — Encounter: Payer: Self-pay | Admitting: Family Medicine

## 2021-05-22 ENCOUNTER — Other Ambulatory Visit: Payer: Self-pay

## 2021-05-22 ENCOUNTER — Ambulatory Visit (INDEPENDENT_AMBULATORY_CARE_PROVIDER_SITE_OTHER): Payer: Medicare Other | Admitting: Family Medicine

## 2021-05-22 VITALS — BP 148/82 | HR 63 | Temp 97.5°F | Ht 64.0 in | Wt 205.0 lb

## 2021-05-22 DIAGNOSIS — E1169 Type 2 diabetes mellitus with other specified complication: Secondary | ICD-10-CM

## 2021-05-22 DIAGNOSIS — G4730 Sleep apnea, unspecified: Secondary | ICD-10-CM

## 2021-05-22 DIAGNOSIS — M1711 Unilateral primary osteoarthritis, right knee: Secondary | ICD-10-CM

## 2021-05-22 DIAGNOSIS — I700 Atherosclerosis of aorta: Secondary | ICD-10-CM

## 2021-05-22 DIAGNOSIS — E785 Hyperlipidemia, unspecified: Secondary | ICD-10-CM | POA: Diagnosis not present

## 2021-05-22 DIAGNOSIS — I10 Essential (primary) hypertension: Secondary | ICD-10-CM

## 2021-05-22 DIAGNOSIS — Z96651 Presence of right artificial knee joint: Secondary | ICD-10-CM

## 2021-05-22 DIAGNOSIS — G4700 Insomnia, unspecified: Secondary | ICD-10-CM | POA: Diagnosis not present

## 2021-05-22 DIAGNOSIS — Z1211 Encounter for screening for malignant neoplasm of colon: Secondary | ICD-10-CM | POA: Diagnosis not present

## 2021-05-22 DIAGNOSIS — Z0000 Encounter for general adult medical examination without abnormal findings: Secondary | ICD-10-CM | POA: Diagnosis not present

## 2021-05-22 DIAGNOSIS — E118 Type 2 diabetes mellitus with unspecified complications: Secondary | ICD-10-CM

## 2021-05-22 MED ORDER — ATORVASTATIN CALCIUM 40 MG PO TABS: 40.0000 mg | ORAL_TABLET | Freq: Every day | ORAL | 3 refills | Status: DC

## 2021-05-22 MED ORDER — LISINOPRIL 10 MG PO TABS: 10.0000 mg | ORAL_TABLET | Freq: Every day | ORAL | 3 refills | Status: DC

## 2021-05-22 MED ORDER — METFORMIN HCL 500 MG PO TABS: 500.0000 mg | ORAL_TABLET | Freq: Two times a day (BID) | ORAL | 3 refills | Status: DC

## 2021-05-22 NOTE — Assessment & Plan Note (Signed)

## 2021-05-22 NOTE — Progress Notes (Signed)
Patient ID: Roger Mcdaniel, male    DOB: 07-16-1951, 70 y.o.   MRN: [id number]  This visit was conducted in person.  BP (!) 148/82   Pulse 63   Temp (!) 97.5 F (36.4 C) (Temporal)   Ht '5\' 4"'  (1.626 m)   Wt 205 lb (93 kg)   SpO2 98%   BMI 35.19 kg/m    CC: AMW Subjective:   HPI: Roger Mcdaniel is a 70 y.o. male presenting on 05/22/2021 for Annual Exam   Did not see health advisor this year.   No results found.  [place] [facility] Visit from 05/22/2021 in [facility]  PHQ-2 Total Score 0       Fall Risk  05/22/2021 04/04/2020 08/22/2019 12/12/2017 12/10/2016  Falls in the past year? 0 0 0 No No  Comment - - [facility] Telephone Survey: data to providers prior to load - -  Number falls in past yr: 0 0 - - -  Injury with Fall? 0 0 - - -  Risk for fall due to : - Medication side effect - - -  Follow up - Falls evaluation completed;Falls prevention discussed - - -   Recent R total knee replacement ([place]) 11/2020, recovered well.  BP elevated - but he ran out of lisinopril 3 days ago. Home BP readings well controlled [id number] systolic.  CBGs running 99-110 fasting Gabapentin 36m BID max - TID dose caused worsening leg swelling.  He stopped naprosyn.  Plantar fasciitis managed with topical voltaren  Notes worsening trouble with sleep maintenance insomnia - wakes up at 5am in the morning. Melatonin didn't help   Preventative: Colon cancer screening - yearly stool kit. No h/o blood in stool. declines colonoscopy Prostate cancer screening - discussed.  Would like to continue yearly PSA.  Lung cancer screening - not eligible  Flu shot - yearly [place]1/2021, 10/2019, booster x2 05/2020, 12/2020  Td 2000, Tdap 2012  Prevnar-13 11/2017, pnuemovax-23 09/2019, 12/2020  Shingrix - discussed - was too expensive  Advanced directives received and scanned 05/2017. HCPOA are son RHeath Larkthen DIL Heather. Grants discretion to [facility] make decisions for  life-prolonging measures.  Seat belt use discussed.  Sunscreen use discussed. No changing moles on skin.  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 Interim medical history since our last visit reviewed. Allergies and medications reviewed and updated. Outpatient Medications Prior to Visit  Medication Sig Dispense Refill   aspirin EC 81 MG tablet Take 81 mg by mouth daily. Swallow whole.     blood glucose meter kit and supplies Fill One touch meter that is covered. Check sugar once daily. Dx E11.9 1 each 0   gabapentin (NEURONTIN) 300 MG capsule Take 300 mg by mouth 2 (two) times daily.     Lancets (ONETOUCH DELICA PLUS [id number] [place]  ONCE DAILY DX E11.9 100 each 3   Magnesium 250 MG TABS Take 250 mg by mouth daily.     ONETOUCH ULTRA test strip CHECK SUGAR ONCE DAILY DX E111.9 100 strip 3   vitamin C (ASCORBIC ACID) 500 MG tablet Take 1 tablet (500 mg total) by mouth 2 (two) times daily.     zinc gluconate 50 MG tablet Take 50 mg by mouth daily.     atorvastatin (LIPITOR) 40 MG tablet TAKE 1 TABLET DAILY 90 tablet 0   lisinopril (ZESTRIL) 10 MG tablet Take 1 tablet (10 mg total) by mouth daily. 90 tablet 1   metFORMIN (GLUCOPHAGE) 500 MG tablet TAKE 1 TABLET BY MOUTH 2 TIMES DAILY WITH A MEAL. 180 tablet 0   docusate sodium (COLACE) 100 MG capsule Take 1 capsule (100 mg total) by mouth 2  (two) times daily. 60 capsule 1   HYDROcodone-acetaminophen (NORCO/VICODIN) 5-325 MG tablet Take 1 tablet by mouth every 4 (four) hours as needed for moderate pain (pain score 4-6). 40 tablet 0   naproxen (NAPROSYN) 500 MG tablet TAKE 1 TABLET TWICE DAILY  WITH MEALS AS NEEDED FOR   MODERATE PAIN 90 tablet 0   ondansetron (ZOFRAN) 4 MG tablet Take 1 tablet (4 mg total) by mouth every 6 (six) hours as needed for nausea. 20 tablet 0   senna (SENOKOT) 8.6 MG TABS tablet Take 2 tablets (17.2 mg total) by mouth at bedtime. 60 tablet 1   tamsulosin (FLOMAX) 0.4 MG CAPS capsule Take 1 capsule (0.4 mg total) by mouth daily. 30 capsule 3   No facility-administered medications prior to visit.     Per HPI unless specifically indicated in ROS section below Review of Systems  Objective:  BP (!) 148/82   Pulse 63   Temp (!) 97.5 F (36.4 C) (Temporal)   Ht '5\' 4"'  (1.626 m)   Wt 205 lb (93 kg)   SpO2 98%   BMI 35.19 kg/m   Wt Readings from Last 3 Encounters:  05/22/21 205 lb (93 kg)  11/27/20 217 lb (98.4 kg)  11/14/20 222 lb 4 oz (100.8 kg)      Physical Exam Vitals and nursing note reviewed.  Constitutional:      General: He is not in acute distress.    Appearance: Normal appearance. He is well-developed. He is not ill-appearing.  HENT:     Head: Normocephalic and atraumatic.     Right Ear: Hearing, tympanic membrane, ear canal and external ear normal.     Left Ear: Hearing, tympanic membrane, ear canal and external ear normal.  Eyes:     General: No scleral icterus.    Extraocular Movements: Extraocular movements intact.     Conjunctiva/sclera: Conjunctivae normal.     Pupils: Pupils are equal, round, and reactive to light.  Neck:     Thyroid: No thyroid mass or thyromegaly.     Vascular: No carotid bruit.  Cardiovascular:     Rate and Rhythm: Normal rate and regular rhythm.     Pulses: Normal pulses.          Radial pulses are 2+ on the right side and 2+ on the left side.      Heart sounds: Normal heart sounds. No murmur heard. Pulmonary:     Effort: Pulmonary effort is normal. No respiratory distress.     Breath sounds: Normal breath sounds. No wheezing, rhonchi or rales.  Abdominal:     General: Bowel sounds are normal. There is no  distension.     Palpations: Abdomen is soft. There is no mass.     Tenderness: There is no abdominal tenderness. There is no guarding or rebound.     Hernia: No hernia is present.     Comments: Mild diastasis recti present  Musculoskeletal:        General: Normal range of motion.     Cervical back: Normal range of motion and neck supple.     Right lower leg: No edema.     Left lower leg: No edema.  Lymphadenopathy:     Cervical: No cervical adenopathy.  Skin:    General: Skin is warm and dry.     Findings: No rash.  Neurological:     General: No focal deficit present.     Mental Status: He is alert and oriented to person, place, and time.     Comments:  Recall 3/3  Calculation 3/5 DLORW  Psychiatric:        Mood and Affect: Mood normal.        Behavior: Behavior normal.        Thought Content: Thought content normal.        Judgment: Judgment normal.      Results for orders placed or performed in visit on 05/18/21  PSA, Medicare  Result Value Ref Range   PSA 1.26 0.10 - 4.00 ng/ml  Hemoglobin A1c  Result Value Ref Range   Hgb A1c MFr Bld 6.4 4.6 - 6.5 %  Comprehensive metabolic panel  Result Value Ref Range   Sodium 137 135 - 145 mEq/L   Potassium 5.1 3.5 - 5.1 mEq/L   Chloride 101 96 - 112 mEq/L   CO2 29 19 - 32 mEq/L   Glucose, Bld 99 70 - 99 mg/dL   BUN 18 6 - 23 mg/dL   Creatinine, Ser 1.14 0.40 - 1.50 mg/dL   Total Bilirubin 1.0 0.2 - 1.2 mg/dL   Alkaline Phosphatase 91 39 - 117 U/L   AST 17 0 - 37 U/L   ALT 14 0 - 53 U/L   Total Protein 6.3 6.0 - 8.3 g/dL   Albumin 4.0 3.5 - 5.2 g/dL   GFR 65.89 >60.00 mL/min   Calcium 9.4 8.4 - 10.5 mg/dL  Lipid panel  Result Value Ref Range   Cholesterol 93 0  - 200 mg/dL   Triglycerides 66.0 0.0 - 149.0 mg/dL   HDL 36.00 (L) >39.00 mg/dL   VLDL 13.2 0.0 - 40.0 mg/dL   LDL Cholesterol 44 0 - 99 mg/dL   Total CHOL/HDL Ratio 3    NonHDL 56.72    Assessment & Plan:  This visit occurred during the SARS-CoV-2 public health emergency.  Safety protocols were in place, including screening questions prior to the visit, additional usage of staff PPE, and extensive cleaning of exam room while observing appropriate contact time as indicated for disinfecting solutions.   Problem List Items Addressed This Visit     Medicare annual wellness visit, subsequent - Primary (Chronic)    I have personally reviewed the Medicare Annual Wellness questionnaire and have noted 1. The patient's medical and social history 2. Their use of alcohol, tobacco or illicit drugs 3. Their current medications and supplements 4. The patient's functional ability including ADL's, fall risks, home safety risks and hearing or visual impairment. Cognitive function has been assessed and addressed as indicated.  5. Diet and physical activity 6. Evidence for depression or mood disorders The patients weight, height, BMI have been  recorded in the chart. I have made referrals, counseling and provided education to the patient based on review of the above and I have provided the pt with a written personalized care plan for preventive services. Provider list updated.. See scanned questionairre as needed for further documentation. Reviewed preventative protocols and updated unless pt declined.       Hyperlipidemia associated with type 2 diabetes mellitus (HCC)    Chronic, stable. Continue current regimen.  The ASCVD Risk score [name], et al., 2013) failed to calculate for the following reasons:   The valid total cholesterol range is 130 to 320 mg/dL       Relevant Medications   aspirin EC 81 MG tablet   atorvastatin (LIPITOR) 40 MG tablet   lisinopril (ZESTRIL) 10 MG tablet    metFORMIN (GLUCOPHAGE) 500 MG tablet   Essential hypertension, benign    Chronic, mildly elevated after running out of lisinopril for a few days - will refill ACEI.       Relevant Medications   aspirin EC 81 MG tablet   atorvastatin (LIPITOR) 40 MG tablet   lisinopril (ZESTRIL) 10 MG tablet   Sleep apnea    No longer on CPAP. Anticipate improvement with weight loss this past year      Severe obesity (BMI 35.0-39.9) with comorbidity ([place])    Congratulated on ongoing weight loss.       Relevant Medications   metFORMIN (GLUCOPHAGE) 500 MG tablet   Insomnia    Failed melatonin. Reviewed sleep hygiene measures.       Controlled diabetes mellitus type 2 with complications (HCC)    Chronic, well controlled only on metformin. RTC 6 mo DM f/u visit.       Relevant Medications   aspirin EC 81 MG tablet   atorvastatin (LIPITOR) 40 MG tablet   lisinopril (ZESTRIL) 10 MG tablet   metFORMIN (GLUCOPHAGE) 500 MG tablet   Atherosclerosis of aorta (HCC)    Continue aspirin, statin.       Relevant Medications   aspirin EC 81 MG tablet   atorvastatin (LIPITOR) 40 MG tablet   lisinopril (ZESTRIL) 10 MG tablet   Degenerative joint disease of right knee    S/p R knee replacement 11/2020      Relevant Medications   aspirin EC 81 MG tablet   Status post right knee replacement   Other Visit Diagnoses     Special screening for malignant neoplasms, colon       Relevant Orders   Fecal occult blood, imunochemical        Meds ordered this encounter  Medications   atorvastatin (LIPITOR) 40 MG tablet    Sig: Take 1 tablet (40 mg total) by mouth daily.    Dispense:  90 tablet    Refill:  3   lisinopril (ZESTRIL) 10 MG tablet    Sig: Take 1 tablet (10 mg total) by mouth daily.    Dispense:  90 tablet    Refill:  3   metFORMIN (GLUCOPHAGE) 500 MG tablet    Sig: Take 1 tablet (500 mg total) by mouth 2 (two) times daily with a meal.    Dispense:  180 tablet    Refill:  3   Orders  Placed This Encounter  Procedures   Fecal occult blood, imunochemical    Standing Status:   Future    Standing Expiration Date:   05/22/2022     Patient instructions: We will request latest eye exam from [facility].  Pass by lab to pick up stool kit.  Return for flu clinic next month.  Good to see you today. Return as needed or in 1 year for next wellness visit.   Sleep hygiene checklist: 1. Avoid naps during the day 2. Avoid stimulants such as caffeine and nicotine. Avoid bedtime alcohol (it can speed onset of sleep but the body's metabolism can cause awakenings). 3. All forms of exercise help ensure sound sleep - limit vigorous exercise to morning or late afternoon 4. Avoid food too close to bedtime including chocolate (which contains caffeine) 5. Soak up natural light 6. Establish regular bedtime routine. 7. Associate bed with sleep - avoid TV, computer or phone, reading while in bed. 8. Ensure pleasant, relaxing sleep environment - quiet, dark, cool room.   Follow up plan: Return in about 6 months (around 11/22/2021) for follow up visit.  Ria Bush, MD

## 2021-05-22 NOTE — Patient Instructions (Addendum)
We will request latest eye exam from [facility].  Pass by lab to pick up stool kit.  Return for flu clinic next month.  Good to see you today. Return as needed or in 1 year for next wellness visit.   Sleep hygiene checklist: 1. Avoid naps during the day 2. Avoid stimulants such as caffeine and nicotine. Avoid bedtime alcohol (it can speed onset of sleep but the body's metabolism can cause awakenings). 3. All forms of exercise help ensure sound sleep - limit vigorous exercise to morning or late afternoon 4. Avoid food too close to bedtime including chocolate (which contains caffeine) 5. Soak up natural light 6. Establish regular bedtime routine. 7. Associate bed with sleep - avoid TV, computer or phone, reading while in bed. 8. Ensure pleasant, relaxing sleep environment - quiet, dark, cool room.   Health Maintenance After Age 39 After age 64, you are at a higher risk for certain long-term diseases and infections as well as injuries from falls. Falls are a major cause of broken bones and head injuries in people who are older than age 51. Getting regular preventive care can help to keep you healthy and well. Preventive care includes getting regular testing and making lifestyle changes as recommended by your health care provider. Talk with your health care provider about: Which screenings and tests you should have. A screening is a test that checks for a disease when you have no symptoms. A diet and exercise plan that is right for you. What should I know about screenings and tests to prevent falls? Screening and testing are the best ways to find a health problem early. Early diagnosis and treatment give you the best chance of managing medical conditions that are common after age 13. Certain conditions and lifestyle choices may make you more likely to have a fall. Your health care provider may recommend: Regular vision checks. Poor vision and conditions such as cataracts can make you more likely  to have a fall. If you wear glasses, make sure to get your prescription updated if your vision changes. Medicine review. Work with your health care provider to regularly review all of the medicines you are taking, including over-the-counter medicines. Ask your health care provider about any side effects that may make you more likely to have a fall. Tell your health care provider if any medicines that you take make you feel dizzy or sleepy. Osteoporosis screening. Osteoporosis is a condition that causes the bones to get weaker. This can make the bones weak and cause them to break more easily. Blood pressure screening. Blood pressure changes and medicines to control blood pressure can make you feel dizzy. Strength and balance checks. Your health care provider may recommend certain tests to check your strength and balance while standing, walking, or changing positions. Foot health exam. Foot pain and numbness, as well as not wearing proper footwear, can make you more likely to have a fall. Depression screening. You may be more likely to have a fall if you have a fear of falling, feel emotionally low, or feel unable to do activities that you used to do. Alcohol use screening. Using too much alcohol can affect your balance and may make you more likely to have a fall. What actions can I take to lower my risk of falls? General instructions Talk with your health care provider about your risks for falling. Tell your health care provider if: You fall. Be sure to tell your health care provider about all falls, even  ones that seem minor. You feel dizzy, sleepy, or off-balance. Take over-the-counter and prescription medicines only as told by your health care provider. These include any supplements. Eat a healthy diet and maintain a healthy weight. A healthy diet includes low-fat dairy products, low-fat (lean) meats, and fiber from whole grains, beans, and lots of fruits and vegetables. Home safety Remove any  tripping hazards, such as rugs, cords, and clutter. Install safety equipment such as grab bars in bathrooms and safety rails on stairs. Keep rooms and walkways well-lit. Activity  Follow a regular exercise program to stay fit. This will help you maintain your balance. Ask your health care provider what types of exercise are appropriate for you. If you need a cane or walker, use it as recommended by your health care provider. Wear supportive shoes that have nonskid soles. Lifestyle Do not drink alcohol if your health care provider tells you not to drink. If you drink alcohol, limit how much you have: 0-1 drink a day for women. 0-2 drinks a day for men. Be aware of how much alcohol is in your drink. In the [place] one drink equals one typical bottle of beer (12 oz), one-half glass of wine (5 oz), or one shot of hard liquor (1 oz). Do not use any products that contain nicotine or tobacco, such as cigarettes and e-cigarettes. If you need help quitting, ask your health care provider. Summary Having a healthy lifestyle and getting preventive care can help to protect your health and wellness after age 29. Screening and testing are the best way to find a health problem early and help you avoid having a fall. Early diagnosis and treatment give you the best chance for managing medical conditions that are more common for people who are older than age 38. Falls are a major cause of broken bones and head injuries in people who are older than age 91. Take precautions to prevent a fall at home. Work with your health care provider to learn what changes you can make to improve your health and wellness and to prevent falls. This information is not intended to replace advice given to you by your health care provider. Make sure you discuss any questions you have with your health care provider. Document Revised: 11/21/2020 Document Reviewed: 08/29/2020 Elsevier Patient Education  2022 [employer].

## 2021-05-22 NOTE — Telephone Encounter (Signed)
Will see today.  

## 2021-05-24 DIAGNOSIS — Z96651 Presence of right artificial knee joint: Secondary | ICD-10-CM | POA: Insufficient documentation

## 2021-05-24 NOTE — Assessment & Plan Note (Signed)
Congratulated on ongoing weight loss. 

## 2021-05-24 NOTE — Assessment & Plan Note (Addendum)
Chronic, well controlled only on metformin. RTC 6 mo DM f/u visit.

## 2021-05-24 NOTE — Assessment & Plan Note (Signed)
Continue aspirin, statin.  

## 2021-05-24 NOTE — Assessment & Plan Note (Addendum)
Chronic, mildly elevated after running out of lisinopril for a few days - will refill ACEI.

## 2021-05-24 NOTE — Assessment & Plan Note (Signed)
S/p R knee replacement 11/2020

## 2021-05-24 NOTE — Assessment & Plan Note (Signed)
Chronic, stable. Continue current regimen. The ASCVD Risk score (Goff DC Jr., et al., 2013) failed to calculate for the following reasons:   The valid total cholesterol range is 130 to 320 mg/dL  

## 2021-05-24 NOTE — Assessment & Plan Note (Signed)
No longer on CPAP. Anticipate improvement with weight loss this past year

## 2021-05-24 NOTE — Assessment & Plan Note (Addendum)
Failed melatonin. Reviewed sleep hygiene measures.

## 2021-05-25 ENCOUNTER — Encounter: Payer: Self-pay | Admitting: Family Medicine

## 2021-05-25 DIAGNOSIS — M65341 Trigger finger, right ring finger: Secondary | ICD-10-CM | POA: Insufficient documentation

## 2021-05-25 DIAGNOSIS — M65342 Trigger finger, left ring finger: Secondary | ICD-10-CM | POA: Diagnosis not present

## 2021-05-27 ENCOUNTER — Other Ambulatory Visit (INDEPENDENT_AMBULATORY_CARE_PROVIDER_SITE_OTHER): Payer: Medicare Other

## 2021-05-27 DIAGNOSIS — Z1211 Encounter for screening for malignant neoplasm of colon: Secondary | ICD-10-CM | POA: Diagnosis not present

## 2021-05-27 LAB — FECAL OCCULT BLOOD, GUAIAC: Fecal Occult Blood: NEGATIVE

## 2021-05-27 LAB — FECAL OCCULT BLOOD, IMMUNOCHEMICAL: Fecal Occult Bld: NEGATIVE

## 2021-05-28 ENCOUNTER — Encounter: Payer: Self-pay | Admitting: Family Medicine

## 2021-06-16 ENCOUNTER — Ambulatory Visit: Payer: Medicare Other

## 2021-06-22 ENCOUNTER — Encounter: Payer: Self-pay | Admitting: Family Medicine

## 2021-06-22 DIAGNOSIS — M65341 Trigger finger, right ring finger: Secondary | ICD-10-CM | POA: Diagnosis not present

## 2021-06-22 DIAGNOSIS — M65342 Trigger finger, left ring finger: Secondary | ICD-10-CM | POA: Diagnosis not present

## 2021-06-22 DIAGNOSIS — Z23 Encounter for immunization: Secondary | ICD-10-CM | POA: Diagnosis not present

## 2021-06-23 NOTE — Telephone Encounter (Signed)
Updated pt's chart.  

## 2021-08-04 DIAGNOSIS — N200 Calculus of kidney: Secondary | ICD-10-CM | POA: Diagnosis not present

## 2021-08-04 DIAGNOSIS — N281 Cyst of kidney, acquired: Secondary | ICD-10-CM | POA: Diagnosis not present

## 2021-09-17 ENCOUNTER — Emergency Department (HOSPITAL_COMMUNITY)
Admission: EM | Admit: 2021-09-17 | Discharge: 2021-09-17 | Disposition: A | Discharge status: Home / Self Care | Payer: Medicare Other | Attending: Emergency Medicine | Admitting: Emergency Medicine

## 2021-09-17 ENCOUNTER — Emergency Department (HOSPITAL_COMMUNITY): Payer: Medicare Other

## 2021-09-17 DIAGNOSIS — R079 Chest pain, unspecified: Secondary | ICD-10-CM | POA: Insufficient documentation

## 2021-09-17 DIAGNOSIS — Z96651 Presence of right artificial knee joint: Secondary | ICD-10-CM | POA: Diagnosis not present

## 2021-09-17 DIAGNOSIS — Z7984 Long term (current) use of oral hypoglycemic drugs: Secondary | ICD-10-CM | POA: Diagnosis not present

## 2021-09-17 DIAGNOSIS — I10 Essential (primary) hypertension: Secondary | ICD-10-CM | POA: Insufficient documentation

## 2021-09-17 DIAGNOSIS — Z7982 Long term (current) use of aspirin: Secondary | ICD-10-CM | POA: Insufficient documentation

## 2021-09-17 DIAGNOSIS — Z79899 Other long term (current) drug therapy: Secondary | ICD-10-CM | POA: Insufficient documentation

## 2021-09-17 DIAGNOSIS — E119 Type 2 diabetes mellitus without complications: Secondary | ICD-10-CM | POA: Diagnosis not present

## 2021-09-17 DIAGNOSIS — R0789 Other chest pain: Secondary | ICD-10-CM | POA: Diagnosis not present

## 2021-09-17 LAB — BASIC METABOLIC PANEL
Anion gap: 7 (ref 5–15)
BUN: 21 mg/dL (ref 8–23)
CO2: 27 mmol/L (ref 22–32)
Calcium: 9 mg/dL (ref 8.9–10.3)
Chloride: 104 mmol/L (ref 98–111)
Creatinine, Ser: 1.09 mg/dL (ref 0.61–1.24)
GFR, Estimated: >60 mL/min (ref >60)
Glucose, Bld: 114 mg/dL — ABNORMAL HIGH (ref 70–99)
Potassium: 4.6 mmol/L (ref 3.5–5.1)
Sodium: 138 mmol/L (ref 135–145)

## 2021-09-17 LAB — CBC WITH DIFFERENTIAL/PLATELET
Abs Immature Granulocytes: 0.02 10*3/uL (ref 0.00–0.07)
Basophils Absolute: 0 10*3/uL (ref 0.0–0.1)
Basophils Relative: 1 %
Eosinophils Absolute: 0.4 10*3/uL (ref 0.0–0.5)
Eosinophils Relative: 5 %
HCT: 45.5 % (ref 39.0–52.0)
Hemoglobin: 15 g/dL (ref 13.0–17.0)
Immature Granulocytes: 0 %
Lymphocytes Relative: 32 %
Lymphs Abs: 2.4 10*3/uL (ref 0.7–4.0)
MCH: 30.1 pg (ref 26.0–34.0)
MCHC: 33 g/dL (ref 30.0–36.0)
MCV: 91.4 fL (ref 80.0–100.0)
Monocytes Absolute: 0.9 10*3/uL (ref 0.1–1.0)
Monocytes Relative: 12 %
Neutro Abs: 3.8 10*3/uL (ref 1.7–7.7)
Neutrophils Relative %: 50 %
Platelets: 195 10*3/uL (ref 150–400)
RBC: 4.98 MIL/uL (ref 4.22–5.81)
RDW: 12.5 % (ref 11.5–15.5)
WBC: 7.5 10*3/uL (ref 4.0–10.5)
nRBC: 0 % (ref 0.0–0.2)

## 2021-09-17 LAB — TROPONIN I (HIGH SENSITIVITY)
Troponin I (High Sensitivity): 8 ng/L (ref <18)
Troponin I (High Sensitivity): 11 ng/L (ref <18)

## 2021-09-17 MED ORDER — ASPIRIN EC 81 MG PO TBEC: 81.0000 mg | DELAYED_RELEASE_TABLET | Freq: Every day | ORAL | 0 refills | Status: AC

## 2021-09-17 MED ORDER — NITROGLYCERIN 0.4 MG SL SUBL: 0.4000 mg | SUBLINGUAL_TABLET | SUBLINGUAL | 0 refills | Status: DC | PRN

## 2021-09-17 MED ORDER — NITROGLYCERIN 0.4 MG SL SUBL
0.4000 mg | SUBLINGUAL_TABLET | SUBLINGUAL | Status: DC | PRN
  Administered 2021-09-17: 19:00:00 0.4 mg via SUBLINGUAL
  Filled 2021-09-17: qty 1

## 2021-09-17 NOTE — ED Provider Notes (Signed)
[facility] EMERGENCY DEPARTMENT Provider Note   CSN: [id number] Arrival date & time: 09/17/21  1813     History Chief Complaint  Patient presents with   Chest Pain    Roger Mcdaniel is a 70 y.o. male.  Presenting to the emergency room with concern for chest pain.  Patient reports chest pain started this morning around 10 AM, left-sided, dull, aching, pressure.  Nonradiating.  No obvious aggravating factors.  Not associated with exertion.  EMS provided nitroglycerin and 324 aspirin, patient reports that his chest pain resolved after receiving the nitroglycerin.  Since being in ER however his chest pain seems to come back.  Currently 3 out of 10 in severity.  No difficulty in breathing.  No back pain.  Has history of diabetes, high blood pressure, high cholesterol.  Denies prior history of CAD.  HPI     Past Medical History:  Diagnosis Date   Bruises easily    Chest pain    CMC arthritis, thumb, degenerative    DDD (degenerative disc disease), cervical 2015   s/o ACDF Sherwood Gambler)   DM type 2 (diabetes mellitus, type 2) ([place])    Dry skin    GERD 07/10/2008   Qualifier: Diagnosis of  By: Council Mechanic MD, Hilaria Ota    GERD (gastroesophageal reflux disease) 09/2003   History of ETT 05/23/2000   Nml (Dr. Acie Fredrickson)   History of kidney stones    HNP (herniated nucleus pulposus), cervical 12/20/2013   Hyperlipidemia 09/2003   Hypertension 06/2004   OSA (obstructive sleep apnea)    was on CPAP, not used  in 25-30 years     Patient Active Problem List   Diagnosis Date Noted   Status post right knee replacement 05/24/2021   Degenerative joint disease of right knee 11/27/2020   Pre-op evaluation 11/14/2020   Neck pain on left side 07/30/2020   Renal cysts, acquired, bilateral 04/11/2020   Atherosclerosis of aorta ([place]) 03/10/2020   Generalized osteoarthritis of hand 04/06/2019   Primary osteoarthritis of right knee 11/21/2018   Medicare annual wellness visit,  subsequent 12/12/2017   Right shoulder pain 07/08/2017   Advanced care planning/counseling discussion 06/11/2017   Insomnia 08/18/2016   Controlled diabetes mellitus type 2 with complications ([place]) 69/79/4801   Severe obesity (BMI 35.0-39.9) with comorbidity ([place]) 12/10/2015   Onychomycosis 12/10/2015   Grieving 12/10/2015   Bunionette 12/23/2011   Hyperlipidemia associated with type 2 diabetes mellitus ([place]) 07/10/2008   Right nephrolithiasis 07/10/2008   Essential hypertension, benign 02/12/2008   Sleep apnea 02/12/2008    Past Surgical History:  Procedure Laterality Date   ANTERIOR CERVICAL DECOMP/DISCECTOMY FUSION N/A 01/14/2014   Procedure: ANTERIOR CERVICAL DECOMPRESSION/DISCECTOMY FUSION CERVICAL FOUR-FIVE,CERVICAL FIVE-SIX ;  Surgeon: Hosie Spangle, MD   CATARACT EXTRACTION Right 02/28/2019   Per patient   CATARACT EXTRACTION Left 03/12/2019   Per patient   CYSTOSCOPY/URETEROSCOPY/HOLMIUM LASER/STENT PLACEMENT Right 05/28/2020   Procedure: CYSTOSCOPY RIGHT URETEROSCOPY/HOLMIUM LASER/STENT PLACEMENT STONE BASKET RETRACTION;  Surgeon: Lucas Mallow, MD;  Location: [facility];  Service: Urology;  Laterality: Right;   EXTRACORPOREAL SHOCK WAVE LITHOTRIPSY Right 03/17/2020   Procedure: RIGHT EXTRACORPOREAL SHOCK WAVE LITHOTRIPSY (ESWL);  Surgeon: Ceasar Mons, MD;  Location: [facility];  Service: Urology;  Laterality: Right;   KNEE ARTHROPLASTY Right 11/27/2020   Procedure: COMPUTER ASSISTED TOTAL KNEE ARTHROPLASTY;  Surgeon: Rod Can, MD;  Location: [facility];  Service: Orthopedics;  Laterality: Right;   ORIF ANKLE FRACTURE  1980   right   RETINAL LASER PROCEDURE Right 03/14/2019   Per patient   SEPTOPLASTY  2005   for sleep apnea //cpap   SHOULDER SURGERY Right 06/2017   Dr Onnie Graham at [facility]) ARTHROPLASTY Right 11/2018   R CMC and R MCP arthroplasty ([place])       Family  History  Problem Relation Age of Onset   Heart disease Father        MI x 2   Hypertension Brother    Obesity Brother    Heart disease Paternal Grandfather         MI   CVA Mother    Cancer Other     Social History   Tobacco Use   Smoking status: Never   Smokeless tobacco: Never  Vaping Use   Vaping Use: Never used  Substance Use Topics   Alcohol use: Not Currently    Alcohol/week: 1.0 standard drink    Types: 1 Standard drinks or equivalent per week   Drug use: No    Home Medications Prior to Admission medications   Medication Sig Start Date End Date Taking? Authorizing Provider  atorvastatin (LIPITOR) 40 MG tablet Take 1 tablet (40 mg total) by mouth daily. 05/22/21  Yes Ria Bush, MD  blood glucose meter kit and supplies Fill One touch meter that is covered. Check sugar once daily. Dx E11.9 09/26/19  Yes Ria Bush, MD  gabapentin (NEURONTIN) 300 MG capsule Take 300 mg by mouth 2 (two) times daily.   Yes [provider]  Lancets [facility] DELICA PLUS HERDEY81K) [place] DX E11.9 12/09/20  Yes Ria Bush, MD  lisinopril (ZESTRIL) 10 MG tablet Take 1 tablet (10 mg total) by mouth daily. 05/22/21  Yes Ria Bush, MD  Magnesium 250 MG TABS Take 250 mg by mouth daily.   Yes [provider]  metFORMIN (GLUCOPHAGE) 500 MG tablet Take 1 tablet (500 mg total) by mouth 2 (two) times daily with a meal. 05/22/21  Yes Ria Bush, MD  nitroGLYCERIN (NITROSTAT) 0.4 MG SL tablet Place 1 tablet (0.4 mg total) under the tongue every 5 (five) minutes as needed for chest pain. 09/17/21  Yes Lucrezia Starch, MD  [facility] ULTRA test strip [address] SUGAR ONCE DAILY DX E111.9 12/09/20  Yes Ria Bush, MD  vitamin C (ASCORBIC ACID) 500 MG tablet Take 1 tablet (500 mg total) by mouth 2 (two) times daily. 04/06/19  Yes Ria Bush, MD  zinc gluconate 50 MG tablet Take 50 mg by mouth daily.   Yes [provider]   aspirin EC 81 MG tablet Take 1 tablet (81 mg total) by mouth daily. Swallow whole. 09/17/21   Lucrezia Starch, MD    Allergies    Codeine sulfate and Pravachol [pravastatin sodium]  Review of Systems   Review of Systems  Constitutional:  Negative for chills and fever.  HENT:  Negative for ear pain and sore throat.   Eyes:  Negative for pain and visual disturbance.  Respiratory:  Negative for cough and shortness of breath.   Cardiovascular:  Positive for chest pain. Negative for palpitations.  Gastrointestinal:  Negative for abdominal pain and vomiting.  Genitourinary:  Negative for dysuria and hematuria.  Musculoskeletal:  Negative for arthralgias and back pain.  Skin:  Negative for color change and rash.  Neurological:  Negative for seizures and syncope.  All other systems reviewed and are negative.  Physical Exam Updated  Vital Signs BP (!) 150/74    Pulse 61    Temp 98.8 F (37.1 C) (Oral)    Resp 16    Ht '5\' 4"'  (1.626 m)    Wt 95.3 kg    SpO2 96%    BMI 36.05 kg/m   Physical Exam Vitals and nursing note reviewed.  Constitutional:      General: He is not in acute distress.    Appearance: He is well-developed.  HENT:     Head: Normocephalic and atraumatic.  Eyes:     Conjunctiva/sclera: Conjunctivae normal.  Cardiovascular:     Rate and Rhythm: Normal rate and regular rhythm.     Heart sounds: No murmur heard. Pulmonary:     Effort: Pulmonary effort is normal. No respiratory distress.     Breath sounds: Normal breath sounds.  Abdominal:     Palpations: Abdomen is soft.     Tenderness: There is no abdominal tenderness.  Musculoskeletal:        General: No swelling.     Cervical back: Neck supple.     Right lower leg: No edema.     Left lower leg: No edema.  Skin:    General: Skin is warm and dry.     Capillary Refill: Capillary refill takes less than 2 seconds.  Neurological:     Mental Status: He is alert.  Psychiatric:        Mood and Affect: Mood normal.     ED Results / Procedures / Treatments   Labs (all labs ordered are listed, but only abnormal results are displayed) Labs Reviewed  BASIC METABOLIC PANEL - Abnormal; Notable for the following components:      Result Value   Glucose, Bld 114 (*)    All other components within normal limits  CBC WITH DIFFERENTIAL/PLATELET  TROPONIN I (HIGH SENSITIVITY)  TROPONIN I (HIGH SENSITIVITY)    EKG EKG Interpretation  Date/Time:  Thursday September 17 2021 19:18:06 EST Ventricular Rate:  61 PR Interval:  165 QRS Duration: 93 QT Interval:  422 QTC Calculation: 426 R Axis:   -39 Text Interpretation: Sinus rhythm Left axis deviation Confirmed by Madalyn Rob [phone]) on 09/17/2021 7:26:42 PM  Radiology DG Chest 2 View  Result Date: 09/17/2021 CLINICAL DATA:  Chest pain EXAM: CHEST - 2 VIEW COMPARISON:  11/14/2020 FINDINGS: Lung volumes are small. There has developed partial collapse of the left lower lobe with progressive elevation of the left hemidiaphragm. No pneumothorax or pleural effusion. Cardiac size within normal limits. Pulmonary vascularity is normal. Osseous structures are age-appropriate. No acute bone abnormality. IMPRESSION: Pulmonary hypoinflation. Superimposed partial left lower lobe collapse. Electronically Signed   By: Fidela Salisbury M.D.   On: 09/17/2021 19:34    Procedures Procedures   Medications Ordered in ED Medications  nitroGLYCERIN (NITROSTAT) SL tablet 0.4 mg (0.4 mg Sublingual Given 09/17/21 1921)    ED Course  I have reviewed the triage vital signs and the nursing notes.  Pertinent labs & imaging results that were available during my care of the patient were reviewed by me and considered in my medical decision making (see chart for details).    MDM Rules/Calculators/A&P                         70 year old gentleman presents to the emergency room with concern for chest pain.  On exam patient well-appearing in no acute distress.  EKG without any acute  ischemic change, when patient reported  recurrence of chest pain, repeated EKG and still no changes.  Serial troponin was within normal limits.  CXR unremarkable.  Remainder basic labs stable.  On reassessment patient had no ongoing pain.  Given patient's risk factors, improvement with nitro, I discussed the case with Dr. Blossom Hoops on-call for cardiology, given the work-up today, he feels patient can be managed in the outpatient setting and recommends close follow-up with cardiology.  Recommends daily baby aspirin and giving patient Rx for nitroglycerin.  Had lengthy discussion with patient regarding discharge plan, return precautions.  Instructed patient that should his chest pain recur and he need the nitroglycerin that he should come back for reassessment.    After the discussed management above, the patient was determined to be safe for discharge.  The patient was in agreement with this plan and all questions regarding their care were answered.  ED return precautions were discussed and the patient will return to the ED with any significant worsening of condition.   Final Clinical Impression(s) / ED Diagnoses Final diagnoses:  Chest pain, unspecified type    Rx / DC Orders ED Discharge Orders          Ordered    aspirin EC 81 MG tablet  Daily        09/17/21 2253    nitroGLYCERIN (NITROSTAT) 0.4 MG SL tablet  Every 5 min PRN        09/17/21 2253    Ambulatory referral to Cardiology        09/17/21 2254             Lucrezia Starch, MD 09/17/21 2306

## 2021-09-17 NOTE — ED Triage Notes (Signed)
Pt experiencing dull left sided, non-radiating, CP since 1000. Continued to work throughout day. No cardiac HX. 200's sysBP with EMS. Given nitro x1 and 324 ASA. BP lowers to 170's sysBP and CP relieved. No SOB, no N/V.

## 2021-09-17 NOTE — ED Notes (Signed)
Nitro SL given. CP relieved within 2 minutes. BP 143/65.

## 2021-09-17 NOTE — ED Notes (Signed)
Pt verbalized understanding of d/c instructions, meds, and followup care. Denies questions. VSS, no distress noted. Steady gait to exit with all belongings. Pt refusing wheelchair. Walks with family.

## 2021-09-17 NOTE — Discharge Instructions (Addendum)
Please follow-up with cardiology.  Come back to ER if you have any recurrent chest pain or difficulty breathing or other new concerning symptom.  The cardiologist recommended taking daily baby aspirin for now.  You can also take the nitroglycerin as needed for chest discomfort however I would recommend coming back to ER if you are having to use this medication to relieve your chest pain.

## 2021-09-18 ENCOUNTER — Telehealth: Payer: Self-pay

## 2021-09-18 NOTE — Telephone Encounter (Signed)
[facility] Day - Client TELEPHONE ADVICE RECORD AccessNurse Patient Name: Roger Mcdaniel NS Gender: Male DOB: 04/29/1951 Age: 70 Y 3 M 4 D Return Phone Number: [phone] (Primary) Address[place]  [address] Client [facility] - Client Client Site  [facility] - Day Provider Eustaquio Boyden - MD Contact Type Call Who Is Calling Patient / Member / Family / Caregiver Call Type Triage / Clinical Relationship To Patient Self Return Phone Number [phone] (Primary) Chief Complaint CHEST PAIN - pain, pressure, heaviness or tightness Reason for Call Symptomatic / Request for Health Information Initial Comment Caller states they are having chest pain and high blood pressure. Translation No Nurse Assessment Nurse: Carlos Levering, RN, Tobi Bastos Date/Time Lamount Cohen Time): 09/17/2021 4:51:57 PM Confirm and document reason for call. If symptomatic, describe symptoms. ---Caller states he is having chest pain and high blood pressure, started this afternoon. BP 177/86 initially, 163/87 currently. Still having cp 2-3/10. Does the patient have any new or worsening symptoms? ---Yes Will a triage be completed? ---Yes Related visit to physician within the last 2 weeks? ---No Does the PT have any chronic conditions? (i.e. diabetes, asthma, this includes High risk factors for pregnancy, etc.) ---Yes List chronic conditions. ---diabetes, htn Is this a behavioral health or substance abuse call? ---No Guidelines Guideline Title Affirmed Question Affirmed Notes Nurse Date/Time (Eastern Time) Chest Pain [1] Chest pain lasts > 5 minutes AND [2] age > 49 Quandt, RN, Tobi Bastos 09/17/2021 4:53:05 PM Disp. Time Lamount Cohen Time) Disposition Final User 09/17/2021 4:50:05 PM Send to Urgent Lanney Gins 09/17/2021 4:57:49 PM 911 Outcome Documentation Quandt, RN, Tobi Bastos PLEASE NOTE: All timestamps contained  within this report are represented as [place] Standard Time. CONFIDENTIALTY NOTICE: This fax transmission is intended only for the addressee. It contains information that is legally privileged, confidential or otherwise protected from use or disclosure. If you are not the intended recipient, you are strictly prohibited from reviewing, disclosing, copying using or disseminating any of this information or taking any action in reliance on or regarding this information. If you have received this fax in error, please notify us immediately by telephone so that we can arrange for its return to [place]. Phone: [phone], Toll-Free: [phone], Fax: [phone] Page: 2 of 2 Call Id: [id number] Disp. Time (Eastern Time) Disposition Final User Reason: declined ems, states niece is a mile away and will drive him in 22/10/5425 0:62:37 PM Call EMS 911 Now Yes Quandt, RN, Carrolyn Leigh Disagree/Comply Disagree Caller Understands Yes PreDisposition InappropriateToAsk Care Advice Given Per Guideline CALL EMS 911 NOW: * Immediate medical attention is needed. You need to hang up and call 911 (or an ambulance). CARE ADVICE given per Chest Pain (Adult) guideline. Referrals [facility]

## 2021-09-18 NOTE — Telephone Encounter (Signed)
Spoke with pt asking about cards f/u.  Pt confirms he has f/u on 10/19/20.  Denies any chest pain at this time.  However, was told if he has chest pain again to take Nitro then return to ED.

## 2021-09-18 NOTE — Telephone Encounter (Signed)
Per chart review tab pt was seen at [facility] ED on 09/17/21. Sending to Dr Reece Agar and Misty Stanley CMA.

## 2021-09-18 NOTE — Telephone Encounter (Signed)
Pt seen at ER and plan was for outpatient cardiology f/u.  Plz ensure he has this scheduled and no further chest pain.

## 2021-09-19 ENCOUNTER — Encounter: Payer: Self-pay | Admitting: Family Medicine

## 2021-09-22 ENCOUNTER — Encounter: Payer: Self-pay | Admitting: Cardiovascular Disease

## 2021-09-29 ENCOUNTER — Encounter: Payer: Self-pay | Admitting: Family Medicine

## 2021-09-29 ENCOUNTER — Ambulatory Visit (INDEPENDENT_AMBULATORY_CARE_PROVIDER_SITE_OTHER): Payer: Medicare Other | Admitting: Family Medicine

## 2021-09-29 ENCOUNTER — Other Ambulatory Visit: Payer: Self-pay

## 2021-09-29 VITALS — BP 140/72 | HR 68 | Temp 98.3°F | Ht 64.0 in | Wt 211.5 lb

## 2021-09-29 DIAGNOSIS — J9811 Atelectasis: Secondary | ICD-10-CM | POA: Insufficient documentation

## 2021-09-29 DIAGNOSIS — R079 Chest pain, unspecified: Secondary | ICD-10-CM | POA: Diagnosis not present

## 2021-09-29 DIAGNOSIS — R0789 Other chest pain: Secondary | ICD-10-CM | POA: Diagnosis not present

## 2021-09-29 DIAGNOSIS — E785 Hyperlipidemia, unspecified: Secondary | ICD-10-CM

## 2021-09-29 DIAGNOSIS — E118 Type 2 diabetes mellitus with unspecified complications: Secondary | ICD-10-CM

## 2021-09-29 DIAGNOSIS — E1169 Type 2 diabetes mellitus with other specified complication: Secondary | ICD-10-CM | POA: Diagnosis not present

## 2021-09-29 DIAGNOSIS — I10 Essential (primary) hypertension: Secondary | ICD-10-CM | POA: Diagnosis not present

## 2021-09-29 LAB — LIPID PANEL
Cholesterol: 106 mg/dL (ref 0–200)
HDL: 45.6 mg/dL (ref >39.00)
LDL Cholesterol: 45 mg/dL (ref 0–99)
NonHDL: 60.21
Total CHOL/HDL Ratio: 2
Triglycerides: 77 mg/dL (ref 0.0–149.0)
VLDL: 15.4 mg/dL (ref 0.0–40.0)

## 2021-09-29 LAB — TSH: TSH: 1.86 u[IU]/mL (ref 0.35–5.50)

## 2021-09-29 LAB — HEMOGLOBIN A1C: Hgb A1c MFr Bld: 6.4 % (ref 4.6–6.5)

## 2021-09-29 NOTE — Patient Instructions (Addendum)
Labs today  Chest xray from hospital showed partial collapse of left lower lobe of lungs likely from fall sustained in November. I wonder how much this is playing a part in current chest pain. We will be in touch with plan for lungs.

## 2021-09-29 NOTE — Assessment & Plan Note (Signed)
Fluctuating BP readings recently despite regularly taking lisinopril 10mg  daily. Anticipate recent pain/discomfort contributing to fluctuating levels. No med changes made today.

## 2021-09-29 NOTE — Assessment & Plan Note (Signed)
Left chest pain described as heaviness and soreness, it is not reproducible on palpation pointing against MSK cause however recent CXR did have evidence of left lower lung partial collapse which likely contributes to current symptoms.  Chest pain is atypical - not exertional or relieved by stress.  Check labwork today, discussed possible chest CT for further evaluation.  Continue aspirin, statin, PRN SL nitro, keep cardiology f/u later this month.

## 2021-09-29 NOTE — Assessment & Plan Note (Signed)
Update FLP on atorvastatin 40mg  daily. The ASCVD Risk score (Arnett DK, et al., 2019) failed to calculate for the following reasons:   The valid total cholesterol range is 130 to 320 mg/dL

## 2021-09-29 NOTE — Assessment & Plan Note (Signed)
Chronic, stable on metformin 500mg  bid. Update A1c.

## 2021-09-29 NOTE — Progress Notes (Signed)
Patient ID: Roger Mcdaniel, male    DOB: [birth year], 71 y.o.   MRN: [id number]  This visit was conducted in person.  BP 140/72    Pulse 68    Temp 98.3 F (36.8 C) (Temporal)    Ht '5\' 4"'  (1.626 m)    Wt 211 lb 8 oz (95.9 kg)    SpO2 97%    BMI 36.30 kg/m    CC: ER f/u visit   Subjective:   HPI: Roger Mcdaniel is a 71 y.o. male presenting on 09/29/2021 for ER follow-up (On 09/17/21 for chest pain/Pt reports chest pain again on 09/28/20/BP readings provided by pt)   Recent ER evaluation on 09/17/2021 for left sided dull aching chest pressure, not exertional, in setting of markedly elevated blood pressures. It did improve after receiving nitroglycerin by EMS. ER workup consisted of chest xray showing new partial collapse of left lower lobe and elevation of left hemidiaphragm, however no pleural effusion or PTX. EKG without acute changes, serial troponins normal, other labs normal including CBC and Cr. Discharged on daily aspirin 4m (was already on this) and PRN SL nitro.   Since home, hasn't been feeling very well.  Last week BP [id number], then over the past 2 days has noted BP rising 1660-630Zsystolic associated with headache and chest discomfort. No significant dyspnea. Chest pain not exertional, not relieved by rest.  No abd pain, nausea, palpitations or dizziness. No pleurisy.  Fluctuating blood pressures despite regular lisinopril 119mdaily in am use.   08/07/2021 - tripped while blowing leaves (leafpack backpack) - landed on left chest with subsequent bruise to left chest wall. Brings picture. Seemed to heal well from this.   Current BP regimen is lisinopril 102maily.  He's not taken any nitroglycerin since home.   He has cardiology f/u planned 10/12/2021 ([facility]).      Relevant past medical, surgical, family and social history reviewed and updated as indicated. Interim medical history since our last visit reviewed. Allergies and medications reviewed and  updated. Outpatient Medications Prior to Visit  Medication Sig Dispense Refill   aspirin EC 81 MG tablet Take 1 tablet (81 mg total) by mouth daily. Swallow whole. 30 tablet 0   atorvastatin (LIPITOR) 40 MG tablet Take 1 tablet (40 mg total) by mouth daily. 90 tablet 3   blood glucose meter kit and supplies Fill One touch meter that is covered. Check sugar once daily. Dx E11.9 1 each 0   gabapentin (NEURONTIN) 300 MG capsule Take 300 mg by mouth 2 (two) times daily.     Lancets (ONETOUCH DELICA PLUS [id number] CHECK SUGAR ONCE DAILY DX E11.9 100 each 3   lisinopril (ZESTRIL) 10 MG tablet Take 1 tablet (10 mg total) by mouth daily. 90 tablet 3   Magnesium 250 MG TABS Take 250 mg by mouth daily.     Melatonin 10 MG TABS Take 10 mg by mouth at bedtime.     metFORMIN (GLUCOPHAGE) 500 MG tablet Take 1 tablet (500 mg total) by mouth 2 (two) times daily with a meal. 180 tablet 3   nitroGLYCERIN (NITROSTAT) 0.4 MG SL tablet Place 1 tablet (0.4 mg total) under the tongue every 5 (five) minutes as needed for chest pain. 30 tablet 0   ONETOUCH ULTRA test strip CHECK SUGAR ONCE DAILY DX E111.9 100 strip 3   vitamin C (ASCORBIC ACID) 500 MG tablet Take 1 tablet (500 mg total) by mouth 2 (two) times daily.  zinc gluconate 50 MG tablet Take 50 mg by mouth daily.     No facility-administered medications prior to visit.     Per HPI unless specifically indicated in ROS section below Review of Systems  Objective:  BP 140/72    Pulse 68    Temp 98.3 F (36.8 C) (Temporal)    Ht '5\' 4"'  (1.626 m)    Wt 211 lb 8 oz (95.9 kg)    SpO2 97%    BMI 36.30 kg/m   Wt Readings from Last 3 Encounters:  09/29/21 211 lb 8 oz (95.9 kg)  09/17/21 210 lb (95.3 kg)  05/22/21 205 lb (93 kg)      Physical Exam Vitals and nursing note reviewed.  Constitutional:      Appearance: Normal appearance. He is obese. He is not ill-appearing.  HENT:     Mouth/Throat:     Mouth: Mucous membranes are moist.     Pharynx:  Oropharynx is clear. No oropharyngeal exudate or posterior oropharyngeal erythema.  Eyes:     Extraocular Movements: Extraocular movements intact.     Pupils: Pupils are equal, round, and reactive to light.  Neck:     Thyroid: No thyroid mass or thyromegaly.  Cardiovascular:     Rate and Rhythm: Normal rate and regular rhythm.     Pulses: Normal pulses.     Heart sounds: Normal heart sounds. No murmur heard. Pulmonary:     Effort: Pulmonary effort is normal. No respiratory distress.     Breath sounds: No wheezing, rhonchi or rales.     Comments: Diminished breath sounds LLL Chest:     Chest wall: No tenderness.  Musculoskeletal:        General: Normal range of motion.     Cervical back: Normal range of motion and neck supple.     Right lower leg: No edema.     Left lower leg: No edema.  Skin:    General: Skin is warm and dry.     Findings: No rash.  Neurological:     Mental Status: He is alert.  Psychiatric:        Mood and Affect: Mood normal.        Behavior: Behavior normal.      Results for orders placed or performed during the hospital encounter of 09/17/21  CBC with Differential  Result Value Ref Range   WBC 7.5 4.0 - 10.5 K/uL   RBC 4.98 4.22 - 5.81 MIL/uL   Hemoglobin 15.0 13.0 - 17.0 g/dL   HCT 45.5 39.0 - 52.0 %   MCV 91.4 80.0 - 100.0 fL   MCH 30.1 26.0 - 34.0 pg   MCHC 33.0 30.0 - 36.0 g/dL   RDW 12.5 11.5 - 15.5 %   Platelets 195 150 - 400 K/uL   nRBC 0.0 0.0 - 0.2 %   Neutrophils Relative % 50 %   Neutro Abs 3.8 1.7 - 7.7 K/uL   Lymphocytes Relative 32 %   Lymphs Abs 2.4 0.7 - 4.0 K/uL   Monocytes Relative 12 %   Monocytes Absolute 0.9 0.1 - 1.0 K/uL   Eosinophils Relative 5 %   Eosinophils Absolute 0.4 0.0 - 0.5 K/uL   Basophils Relative 1 %   Basophils Absolute 0.0 0.0 - 0.1 K/uL   Immature Granulocytes 0 %   Abs Immature Granulocytes 0.02 0.00 - 0.07 K/uL  Basic metabolic panel  Result Value Ref Range   Sodium 138 135 - 145 mmol/L  Potassium 4.6 3.5 - 5.1 mmol/L   Chloride 104 98 - 111 mmol/L   CO2 27 22 - 32 mmol/L   Glucose, Bld 114 (H) 70 - 99 mg/dL   BUN 21 8 - 23 mg/dL   Creatinine, Ser 1.09 0.61 - 1.24 mg/dL   Calcium 9.0 8.9 - 10.3 mg/dL   GFR, Estimated >60 >60 mL/min   Anion gap 7 5 - 15  Troponin I (High Sensitivity)  Result Value Ref Range   Troponin I (High Sensitivity) 8 <18 ng/L  Troponin I (High Sensitivity)  Result Value Ref Range   Troponin I (High Sensitivity) 11 <18 ng/L   Lab Results  Component Value Date   HGBA1C 6.4 05/18/2021    Lab Results  Component Value Date   CHOL 93 05/18/2021   HDL 36.00 (L) 05/18/2021   LDLCALC 44 05/18/2021   LDLDIRECT 143.5 04/27/2011   TRIG 66.0 05/18/2021   CHOLHDL 3 05/18/2021    Assessment & Plan:  This visit occurred during the SARS-CoV-2 public health emergency.  Safety protocols were in place, including screening questions prior to the visit, additional usage of staff PPE, and extensive cleaning of exam room while observing appropriate contact time as indicated for disinfecting solutions.   Problem List Items Addressed This Visit     Hyperlipidemia associated with type 2 diabetes mellitus ([place])    Update FLP on atorvastatin 81m daily. The ASCVD Risk score (Arnett DK, et al., 2019) failed to calculate for the following reasons:   The valid total cholesterol range is 130 to 320 mg/dL       Relevant Orders   Lipid panel   TSH   Essential hypertension, benign    Fluctuating BP readings recently despite regularly taking lisinopril 153mdaily. Anticipate recent pain/discomfort contributing to fluctuating levels. No med changes made today.       Controlled diabetes mellitus type 2 with complications (HCC)    Chronic, stable on metformin 50018mid. Update A1c.       Relevant Orders   Lipid panel   Hemoglobin A1c   Chest discomfort - Primary    Left chest pain described as heaviness and soreness, it is not reproducible on palpation pointing  against MSK cause however recent CXR did have evidence of left lower lung partial collapse which likely contributes to current symptoms.  Chest pain is atypical - not exertional or relieved by stress.  Check labwork today, discussed possible chest CT for further evaluation.  Continue aspirin, statin, PRN SL nitro, keep cardiology f/u later this month.       Relevant Orders   TSH   Collapse of left lung    New finding on recent cxr after fall sustained 07/2021. Anticipate this contributes to chest discomfort - will check contrasted CT chest for more comprehensive evaluation.       Other Visit Diagnoses     Chest pain, unspecified type       Relevant Orders   CT Chest W Contrast        No orders of the defined types were placed in this encounter.  Orders Placed This Encounter  Procedures   CT Chest W Contrast    Standing Status:   Future    Standing Expiration Date:   09/29/2022    Scheduling Instructions:     [facility] preferred    Order Specific Question:   If indicated for the ordered procedure, I authorize the administration of contrast media per Radiology protocol  Answer:   Yes    Order Specific Question:   Preferred imaging location?    Answer:   [id number]   Lipid panel   TSH   Hemoglobin A1c     Patient Instructions  Labs today  Chest xray from hospital showed partial collapse of left lower lobe of lungs likely from fall sustained in November. I wonder how much this is playing a part in current chest pain. We will be in touch with plan for lungs.   Follow up plan: Return if symptoms worsen or fail to improve.  Ria Bush, MD

## 2021-09-29 NOTE — Assessment & Plan Note (Addendum)
New finding on recent cxr after fall sustained 07/2021. Anticipate this contributes to chest discomfort - will check contrasted CT chest for more comprehensive evaluation.

## 2021-09-30 ENCOUNTER — Encounter: Payer: Self-pay | Admitting: Family Medicine

## 2021-10-06 ENCOUNTER — Ambulatory Visit
Admission: RE | Admit: 2021-10-06 | Discharge: 2021-10-06 | Disposition: A | Discharge status: Home / Self Care | Payer: Medicare Other | Source: Ambulatory Visit | Attending: Family Medicine | Admitting: Family Medicine

## 2021-10-06 DIAGNOSIS — J9819 Other pulmonary collapse: Secondary | ICD-10-CM | POA: Diagnosis not present

## 2021-10-06 DIAGNOSIS — I700 Atherosclerosis of aorta: Secondary | ICD-10-CM | POA: Diagnosis not present

## 2021-10-06 DIAGNOSIS — R079 Chest pain, unspecified: Secondary | ICD-10-CM

## 2021-10-06 DIAGNOSIS — I2510 Atherosclerotic heart disease of native coronary artery without angina pectoris: Secondary | ICD-10-CM | POA: Diagnosis not present

## 2021-10-06 MED ORDER — IOPAMIDOL (ISOVUE-300) INJECTION 61%
75.0000 mL | Freq: Once | INTRAVENOUS | Status: AC | PRN
  Administered 2021-10-06: 75 mL via INTRAVENOUS

## 2021-10-08 ENCOUNTER — Encounter: Payer: Self-pay | Admitting: Family Medicine

## 2021-10-08 DIAGNOSIS — J9811 Atelectasis: Secondary | ICD-10-CM

## 2021-10-12 ENCOUNTER — Ambulatory Visit (INDEPENDENT_AMBULATORY_CARE_PROVIDER_SITE_OTHER): Payer: Medicare Other | Admitting: Cardiovascular Disease

## 2021-10-12 ENCOUNTER — Telehealth (HOSPITAL_COMMUNITY): Payer: Self-pay

## 2021-10-12 ENCOUNTER — Encounter: Payer: Self-pay | Admitting: Cardiovascular Disease

## 2021-10-12 ENCOUNTER — Other Ambulatory Visit: Payer: Self-pay

## 2021-10-12 VITALS — BP 142/76 | HR 64 | Ht 64.0 in | Wt 220.2 lb

## 2021-10-12 DIAGNOSIS — I10 Essential (primary) hypertension: Secondary | ICD-10-CM | POA: Diagnosis not present

## 2021-10-12 DIAGNOSIS — R079 Chest pain, unspecified: Secondary | ICD-10-CM

## 2021-10-12 DIAGNOSIS — E785 Hyperlipidemia, unspecified: Secondary | ICD-10-CM | POA: Diagnosis not present

## 2021-10-12 DIAGNOSIS — E1169 Type 2 diabetes mellitus with other specified complication: Secondary | ICD-10-CM | POA: Diagnosis not present

## 2021-10-12 NOTE — Patient Instructions (Signed)
Medication Instructions:   Your physician recommends that you continue on your current medications as directed. Please refer to the Current Medication list given to you today.  *If you need a refill on your cardiac medications before your next appointment, please call your pharmacy*   Lab Work:  None ordered.   If you have labs (blood work) drawn today and your tests are completely normal, you will receive your results only by: MyChart Message (if you have MyChart) OR A paper copy in the mail If you have any lab test that is abnormal or we need to change your treatment, we will call you to review the results.   Testing/Procedures:  You are scheduled for a Myocardial Perfusion Imaging Study on Tuesday, January 17 at 7:15 am.   Please arrive 15 minutes prior to your appointment time for registration and insurance purposes.   The test will take approximately 3 to 4 hours to complete; you may bring reading material. If someone comes with you to your appointment, they will need to remain in the main lobby due to limited space in the testing area.   How to prepare for your Myocardial Perfusion test:   Do not eat or drink 3 hours prior to your test, except you may have water.    Do not consume products containing caffeine (regular or decaffeinated) 12 hours prior to your test (ex: coffee, chocolate, soda, tea)   Do bring a list of your current medications with you. If not listed below, you may take your medications as normal.  Bring any held medication to your appointment, as you may be required to take it once the test is complete.   Do wear comfortable clothes (no overalls) and walking shoes. Tennis shoes are preferred. No open toed shoes.  Do not wear cologne, aftershave or lotions (deodorant is allowed).   If these instructions are not followed, you test will have to be rescheduled.   Please report to [address] for your test. If you have questions or  concerns about your appointment, please call the Nuclear Lab at #[phone].  If you cannot keep your appointment, please provide 24 hour notification to the Nuclear lab to avoid a possible $50 charge to your account.       Follow-Up: At [facility], you and your health needs are our priority.  As part of our continuing mission to provide you with exceptional heart care, we have created designated Provider Care Teams.  These Care Teams include your primary Cardiologist (physician) and Advanced Practice Providers (APPs -  Physician Assistants and Nurse Practitioners) who all work together to provide you with the care you need, when you need it.  We recommend signing up for the patient portal called "MyChart".  Sign up information is provided on this After Visit Summary.  MyChart is used to connect with patients for Virtual Visits (Telemedicine).  Patients are able to view lab/test results, encounter notes, upcoming appointments, etc.  Non-urgent messages can be sent to your provider as well.   To learn more about what you can do with MyChart, go to [email or web address].    Your next appointment:   6 month(s)  The format for your next appointment:   In Person  Provider:   Tereso Newcomer, PA-C         Other Instructions

## 2021-10-12 NOTE — Telephone Encounter (Signed)
Patient given detailed instructions per Myocardial Perfusion Study Information Sheet for the test on 10/13/2021 at 7:15. Patient notified to arrive 15 minutes early and that it is imperative to arrive on time for appointment to keep from having the test rescheduled.  If you need to cancel or reschedule your appointment, please call the office within 24 hours of your appointment. . Patient verbalized understanding.Roger Mcdaniel

## 2021-10-12 NOTE — Progress Notes (Signed)
Cardiology Office Note   Date:  10/12/2021   ID:  Roger Mcdaniel, Roger Mcdaniel [birth year], MRN [id number]  PCP:  Ria Bush, MD  Cardiologist:   Mertie Moores, MD   Chief Complaint  Patient presents with   Hypertension        1. Hypertension 2. Obesity 3. Obstructive sleep apnea  History of Present Illness:  Roger Mcdaniel is a 71 year old gentleman with a history of hypertension and sleep apnea. I saw him approximately one month ago for hypertension. He has been gradually titrating his medications up and he still had episodes of hypertension. He's been limiting his salt intake. He denies any chest pain or shortness of breath.  He's been taking care of his granddaughter for the past 6 months. As result he's gained a lot of weight. He's not been able to exercise.  He was tried on Pravachol. He had lots of muscle aches and pains and had to stop the Pravachol. His muscle aches and pains have improved.  Feb 09, 2013:  Doing well from a cardiac standpoint.  He had some generalized aches that lasted 3 days.  Associated with some stomach issues.  Feb. 23, 2015:  Roger Mcdaniel is feeling well. He has been having some episodes of lightheadedness and has been found to have orthostatic hypotension. One is his 62 yo cousins diet this past week of a sudden heart attack.     He has had some left shoulder / rotator cuff difficulities.   He has had some leg swelling.  He has not been exercising much.    06/28/2014:  Roger Mcdaniel is doing ok.  Had neck surgery since I have seen him.   He has taken a new job - much less stress. He has lost > 10 lbs.     December 19, 2014:  Roger Mcdaniel is a 71 y.o. male who presents for follow up of his HTN Doing much better with his new job.  Exercising , walking regulary.   December 23, 2015:  Roger Mcdaniel is seen back for a 1 year visit. His wife died after leg surgery a year Has gained some weight  Has been diagnosed with DM type 2.   Has been going to diabetes classes and has  lost some weight. Using the stairs instead of the elevator .    Jan. 16, 2023: Roger Mcdaniel is seen back for follow up visit  Was seen 5 years ago  [place] on the left side of his chest Nov. 11.   Developed left sided chest pain on Dec. 22 Called nurse ,   called EMS  Went to the er , BP was elevated , Troponin was negative , work up in [employer] was negative .  Was given script for NTG .  CRR shows partial collaspse of the left lower lobe with progression of the left hemidiaphragm elevation  CT showed the L pneumothorax  Also showed extensive CAC     Pain is not worsened with deep breath The discomfort has generally improved.  BP at home is generally improved.  We did a stress test years ago .   LDL is 45.   Past Medical History:  Diagnosis Date   Bruises easily    Chest pain    CMC arthritis, thumb, degenerative    DDD (degenerative disc disease), cervical 2015   s/o ACDF [facility])   DM type 2 (diabetes mellitus, type 2) ([place])    Dry skin    GERD 07/10/2008   Qualifier: Diagnosis  of  By: Council Mechanic MD, Hilaria Ota    GERD (gastroesophageal reflux disease) 09/2003   History of ETT 05/23/2000   Nml (Dr. Acie Fredrickson)   History of kidney stones    HNP (herniated nucleus pulposus), cervical 12/20/2013   Hyperlipidemia 09/2003   Hypertension 06/2004   OSA (obstructive sleep apnea)    was on CPAP, not used  in 25-30 years     Past Surgical History:  Procedure Laterality Date   ANTERIOR CERVICAL DECOMP/DISCECTOMY FUSION N/A 01/14/2014   Procedure: ANTERIOR CERVICAL DECOMPRESSION/DISCECTOMY FUSION CERVICAL FOUR-FIVE,CERVICAL FIVE-SIX ;  Surgeon: Hosie Spangle, MD   CATARACT EXTRACTION Right 02/28/2019   Per patient   CATARACT EXTRACTION Left 03/12/2019   Per patient   CYSTOSCOPY/URETEROSCOPY/HOLMIUM LASER/STENT PLACEMENT Right 05/28/2020   Procedure: CYSTOSCOPY RIGHT URETEROSCOPY/HOLMIUM LASER/STENT PLACEMENT STONE BASKET RETRACTION;  Surgeon: Lucas Mallow, MD;  [facility];  Service: Urology;  Laterality: Right;   EXTRACORPOREAL SHOCK WAVE LITHOTRIPSY Right 03/17/2020   Procedure: RIGHT EXTRACORPOREAL SHOCK WAVE LITHOTRIPSY (ESWL);  Surgeon: Ceasar Mons, MD;  Location: [facility];  Service: Urology;  Laterality: Right;   KNEE ARTHROPLASTY Right 11/27/2020   Procedure: COMPUTER ASSISTED TOTAL KNEE ARTHROPLASTY;  Surgeon: Rod Can, MD;  Location: [facility];  Service: Orthopedics;  Laterality: Right;   ORIF ANKLE FRACTURE  1980   right   RETINAL LASER PROCEDURE Right 03/14/2019   Per patient   SEPTOPLASTY  2005   for sleep apnea //cpap   SHOULDER SURGERY Right 06/2017   Dr Onnie Graham at [facility]) ARTHROPLASTY Right 11/2018   R CMC and R MCP arthroplasty ([place])     Current Outpatient Medications  Medication Sig Dispense Refill   aspirin EC 81 MG tablet Take 1 tablet (81 mg total) by mouth daily. Swallow whole. 30 tablet 0   atorvastatin (LIPITOR) 40 MG tablet Take 1 tablet (40 mg total) by mouth daily. 90 tablet 3   blood glucose meter kit and supplies Fill One touch meter that is covered. Check sugar once daily. Dx E11.9 1 each 0   gabapentin (NEURONTIN) 300 MG capsule Take 300 mg by mouth 2 (two) times daily.     Lancets (ONETOUCH DELICA PLUS [id number]) MISC CHECK SUGAR ONCE DAILY DX E11.9 100 each 3   lisinopril (ZESTRIL) 10 MG tablet Take 1 tablet (10 mg total) by mouth daily. 90 tablet 3   Magnesium 250 MG TABS Take 250 mg by mouth daily.     metFORMIN (GLUCOPHAGE) 500 MG tablet Take 1 tablet (500 mg total) by mouth 2 (two) times daily with a meal. 180 tablet 3   nitroGLYCERIN (NITROSTAT) 0.4 MG SL tablet Place 1 tablet (0.4 mg total) under the tongue every 5 (five) minutes as needed for chest pain. 30 tablet 0   ONETOUCH ULTRA test strip CHECK SUGAR ONCE DAILY DX E111.9 100 strip 3   vitamin C (ASCORBIC ACID) 500 MG tablet Take 1 tablet (500 mg total) by  mouth 2 (two) times daily.     zinc gluconate 50 MG tablet Take 50 mg by mouth daily.     No current facility-administered medications for this visit.    Allergies:   Codeine sulfate and Pravachol [pravastatin sodium]    Social History:  The patient  reports that he has never smoked. He has never used smokeless tobacco. He reports that he does not currently use alcohol after a past usage of about 1.0 standard  drink per week. He reports that he does not use drugs.   Family History:  The patient's family history includes CVA in his mother; Cancer in an other family member; Heart disease in his father and paternal grandfather; Hypertension in his brother; Obesity in his brother.    ROS:  Please see the history of present illness.      All other systems are reviewed and negative.   Physical Exam: Blood pressure (!) 142/76, pulse 64, height _0  (1.626 m), weight 220 lb 3.2 oz (99.9 kg), SpO2 98 %.  GEN:  moderately obese male, , NAD  HEENT: Normal NECK: No JVD; No carotid bruits LYMPHATICS: No lymphadenopathy CARDIAC: RRR , no murmurs, rubs, gallops RESPIRATORY:  Clear to auscultation without rales, wheezing or rhonchi  ABDOMEN: Soft, non-tender, non-distended MUSCULOSKELETAL:  No edema; No deformity  SKIN: Warm and dry NEUROLOGIC:  Alert and oriented x 3   EKG:       Recent Labs: 11/14/2020: Magnesium 2.2 05/18/2021: ALT 14 09/17/2021: BUN 21; Creatinine, Ser 1.09; Hemoglobin 15.0; Platelets 195; Potassium 4.6; Sodium 138 09/29/2021: TSH 1.86    Lipid Panel    Component Value Date/Time   CHOL 106 09/29/2021 1008   CHOL 106 12/16/2014 0832   TRIG 77.0 09/29/2021 1008   TRIG 107 04/02/2013 0739   HDL 45.60 09/29/2021 1008   HDL 37 (L) 12/16/2014 0832   HDL 38 (L) 04/02/2013 0739   CHOLHDL 2 09/29/2021 1008   VLDL 15.4 09/29/2021 1008   LDLCALC 45 09/29/2021 1008   LDLCALC 54 12/16/2014 0832   LDLCALC 55 04/02/2013 0739   LDLDIRECT 143.5 04/27/2011 0816      Wt  Readings from Last 3 Encounters:  10/12/21 220 lb 3.2 oz (99.9 kg)  09/29/21 211 lb 8 oz (95.9 kg)  09/17/21 210 lb (95.3 kg)      Other studies Reviewed: Additional studies/ records that were reviewed today include: . Review of the above records demonstrates:    ASSESSMENT AND PLAN:  1. Hypertension - BP is typically normal at home    2. Obesity -  plans per primary MD    3.  Pneumothorax/chest pain :   He fell on the left side of his chest and developed a left pneumothorax.  I suspect this is the cause of his current chest pain.  He does have extensive coronary artery calcifications as noted by lung CT scan. He needs to be seen by pulmonary for further evaluation of this pneumothorax.  It is likely that this will resolve but I would hate for it to become scarred and permanent if he he might actually benefit from a thoracentesis.  Given his history of hyperlipidemia, diabetes mellitus coronary artery calcifications I would like to proceed with a [place] study to make sure that he is not also having anginal-like symptoms.  4. Hyperglycemia:      5. Hyperlipidemia:       Current medicines are reviewed at length with the patient today.  The patient does not have concerns regarding medicines.  The following changes have been made:  no change  Labs/ tests ordered today include:   Orders Placed This Encounter  Procedures   Cardiac Stress Test: Informed Consent Details: Physician/Practitioner Attestation; Transcribe to consent form and obtain patient signature   Myocardial Perfusion Imaging     Disposition:   We will have him see an APP in 6 months.  I will plan on seeing him in 1 year.   Signed,  Mertie Moores, MD  10/12/2021 5:48 PM    [address], [place], [place]  [address] Phone: [phone]; Fax: [phone]

## 2021-10-13 ENCOUNTER — Ambulatory Visit (HOSPITAL_COMMUNITY): Payer: Medicare Other | Attending: Cardiology

## 2021-10-13 DIAGNOSIS — R079 Chest pain, unspecified: Secondary | ICD-10-CM | POA: Diagnosis not present

## 2021-10-13 DIAGNOSIS — E1169 Type 2 diabetes mellitus with other specified complication: Secondary | ICD-10-CM | POA: Diagnosis not present

## 2021-10-13 DIAGNOSIS — I10 Essential (primary) hypertension: Secondary | ICD-10-CM | POA: Insufficient documentation

## 2021-10-13 DIAGNOSIS — E785 Hyperlipidemia, unspecified: Secondary | ICD-10-CM | POA: Diagnosis not present

## 2021-10-13 LAB — MYOCARDIAL PERFUSION IMAGING
Base ST Depression (mm): 0 mm
LV dias vol: 88 mL (ref 62–150)
LV sys vol: 29 mL
Nuc Stress EF: 67 %
Peak HR: 99 {beats}/min
Rest HR: 61 {beats}/min
Rest Nuclear Isotope Dose: 10.2 mCi
SDS: 0
SRS: 0
SSS: 0
ST Depression (mm): 0 mm
Stress Nuclear Isotope Dose: 31.4 mCi
TID: 0.91

## 2021-10-13 MED ORDER — REGADENOSON 0.4 MG/5ML IV SOLN
0.4000 mg | Freq: Once | INTRAVENOUS | Status: AC
  Administered 2021-10-13: 0.4 mg via INTRAVENOUS

## 2021-10-13 MED ORDER — TECHNETIUM TC 99M TETROFOSMIN IV KIT
10.2000 | PACK | Freq: Once | INTRAVENOUS | Status: AC | PRN
  Administered 2021-10-13: 10.2 via INTRAVENOUS
  Filled 2021-10-13: qty 11

## 2021-10-13 MED ORDER — TECHNETIUM TC 99M TETROFOSMIN IV KIT
31.4000 | PACK | Freq: Once | INTRAVENOUS | Status: AC | PRN
  Administered 2021-10-13: 31.4 via INTRAVENOUS
  Filled 2021-10-13: qty 32

## 2021-10-14 NOTE — Addendum Note (Signed)
Addended by: Eustaquio Boyden on: 10/14/2021 02:56 PM   Modules accepted: Orders

## 2021-10-19 ENCOUNTER — Ambulatory Visit: Payer: Medicare Other | Admitting: Cardiovascular Disease

## 2021-10-26 ENCOUNTER — Encounter: Payer: Self-pay | Admitting: Pulmonary Disease

## 2021-10-26 ENCOUNTER — Other Ambulatory Visit: Payer: Self-pay

## 2021-10-26 ENCOUNTER — Ambulatory Visit (INDEPENDENT_AMBULATORY_CARE_PROVIDER_SITE_OTHER): Payer: Medicare Other | Admitting: Pulmonary Disease

## 2021-10-26 VITALS — BP 124/80 | HR 70 | Temp 98.3°F | Ht 64.0 in | Wt 219.0 lb

## 2021-10-26 DIAGNOSIS — J9811 Atelectasis: Secondary | ICD-10-CM | POA: Diagnosis not present

## 2021-10-26 NOTE — Patient Instructions (Signed)
We will schedule you for a bronchoscopy to take a look in the airway to make sure there is nothing obstructing the lung  We will schedule you for breathing study to assess lung function  Continue regular exercises  Incentive spirometer may help, not as good as just getting regular walks/exercises  We will be in touch with you to schedule the bronchoscopy

## 2021-10-26 NOTE — Progress Notes (Signed)
PCCM:   Orders placed for bronchoscopy.  Video bronch with BAL   Garner Nash, DO [facility] 10/26/2021 3:09 PM

## 2021-10-26 NOTE — H&P (View-Only) (Signed)
Roger Mcdaniel    [id number]    12-Jan-1951  Primary Care Physician:Gutierrez, Garlon Hatchet, MD  Referring Physician: Ria Bush, MD [address] [place],  [place] [address]  Chief complaint:   Recently seen in the emergency room for chest discomfort  HPI:  Recent CT shows atelectasis left lower lobe  He had had a chest injury in November fell and injured his chest left him with a bruise.  Did not have any discomfort apart from just bruising on his chest.  A few weeks later did have some chest pain and he went to the hospital to get evaluated.  He had a CT scan of his chest showing atelectasis at the base.  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  He has hypertension, diabetes, hypercholesterolemia  Outpatient Encounter Medications as of 10/26/2021  Medication Sig   aspirin EC 81 MG tablet Take 1 tablet (81 mg total) by mouth daily. Swallow whole.   atorvastatin (LIPITOR) 40 MG tablet Take 1 tablet (40 mg total) by mouth daily.   blood glucose meter kit and supplies Fill One touch meter that is covered. Check sugar once daily. Dx E11.9   gabapentin (NEURONTIN) 300 MG capsule Take 300 mg by mouth 2 (two) times daily.   Lancets (ONETOUCH DELICA PLUS [id number]) MISC CHECK SUGAR ONCE DAILY DX E11.9   lisinopril (ZESTRIL) 10 MG tablet Take 1 tablet (10 mg total) by mouth daily.   Magnesium 250 MG TABS Take 250 mg by mouth daily.   metFORMIN (GLUCOPHAGE) 500 MG tablet Take 1 tablet (500 mg total) by mouth 2 (two) times daily with a meal.   nitroGLYCERIN (NITROSTAT) 0.4 MG SL tablet Place 1 tablet (0.4 mg total) under the tongue every 5 (five) minutes as needed for chest pain.   ONETOUCH  ULTRA test strip CHECK SUGAR ONCE DAILY DX E111.9   vitamin C (ASCORBIC ACID) 500 MG tablet Take 1 tablet (500 mg total) by mouth 2 (two) times daily.   zinc gluconate 50 MG tablet Take 50 mg by mouth daily.   No facility-administered encounter medications on file as of 10/26/2021.    Allergies as of 10/26/2021 - Review Complete 10/26/2021  Allergen Reaction Noted   Codeine sulfate Nausea Only 07/10/2008   Pravachol [pravastatin sodium] Other (See Comments) 07/19/2012    Past Medical History:  Diagnosis Date   Bruises easily    Chest pain    CMC arthritis, thumb, degenerative    DDD (degenerative disc disease), cervical 2015   s/o ACDF [facility])   DM type 2 (diabetes mellitus, type 2) ([place])    Dry skin    GERD 07/10/2008   Qualifier: Diagnosis of  By: Council Mechanic MD, Hilaria Ota    GERD (gastroesophageal reflux disease) 09/2003   History of ETT 05/23/2000   Nml (Dr. Acie Fredrickson)   History of kidney stones    HNP (herniated nucleus pulposus), cervical 12/20/2013   Hyperlipidemia 09/2003   Hypertension 06/2004   OSA (obstructive sleep apnea)    was on CPAP, not used  in 25-30 years     Past Surgical History:  Procedure Laterality Date   ANTERIOR CERVICAL DECOMP/DISCECTOMY FUSION N/A  01/14/2014  ° Procedure: ANTERIOR CERVICAL DECOMPRESSION/DISCECTOMY FUSION CERVICAL FOUR-FIVE,CERVICAL FIVE-SIX ;  Surgeon: Robert W Nudelman, MD  ° CATARACT EXTRACTION Right 02/28/2019  ° Per patient  ° CATARACT EXTRACTION Left 03/12/2019  ° Per patient  ° CYSTOSCOPY/URETEROSCOPY/HOLMIUM LASER/STENT PLACEMENT Right 05/28/2020  ° Procedure: CYSTOSCOPY RIGHT URETEROSCOPY/HOLMIUM LASER/STENT PLACEMENT STONE BASKET RETRACTION;  Surgeon: Bell, Eugene D III, MD;  Location: [facility];  Service: Urology;  Laterality: Right;  ° EXTRACORPOREAL SHOCK WAVE LITHOTRIPSY Right 03/17/2020  ° Procedure: RIGHT EXTRACORPOREAL SHOCK WAVE LITHOTRIPSY (ESWL);  Surgeon: Winter, Christopher Aaron, MD;  Location: [facility];  Service: Urology;  Laterality: Right;  ° KNEE ARTHROPLASTY Right 11/27/2020  ° Procedure: COMPUTER ASSISTED TOTAL KNEE ARTHROPLASTY;  Surgeon: Swinteck, Brian, MD;  Location: [facility];  Service: Orthopedics;  Laterality: Right;  ° ORIF ANKLE FRACTURE  1980  ° right  ° RETINAL LASER PROCEDURE Right 03/14/2019  ° Per patient  ° SEPTOPLASTY  2005  ° for sleep apnea //cpap  ° SHOULDER SURGERY Right 06/2017  ° Dr Supple at [facility]  ° WRIST ARTHROSCOPY WITH CARPOMETACARPEL (CMC) ARTHROPLASTY Right 11/2018  ° R CMC and R MCP arthroplasty ([facility])  ° ° °Family History  °Problem Relation Age of Onset  ° Heart disease Father   °     MI x 2  ° Hypertension Brother   ° Obesity Brother   ° Heart disease Paternal Grandfather   °      MI  ° CVA Mother   ° Cancer Other   ° ° °Social History  ° °Socioeconomic History  ° Marital status: Widowed  °  Spouse name: Not on file  ° Number of children: Not on file  ° Years of education: Not on file  ° Highest education level: Not on file  °Occupational History  ° Occupation: Trucking Point Manager  °  Comment: Safety Director  °Tobacco Use  ° Smoking status: Never  ° Smokeless tobacco: Never  °Vaping Use  ° Vaping Use: Never used  °Substance and Sexual Activity  ° Alcohol use: Not Currently  °  Alcohol/week: 1.0 standard drink  °  Types: 1 Standard drinks or equivalent per week  ° Drug use: No  ° Sexual activity: Yes  °Other Topics Concern  ° Not on file  °Social History Narrative  ° Married. 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  °Psychiatric/Behavioral:  Positive for sleep disturbance.   ° °Vitals:  ° 10/26/21 1435  °BP: 124/80  °Pulse: 70  °Temp: 98.3 °F (36.8 °C)  °SpO2: 99%  ° ° ° °Physical Exam °Constitutional:   °   Appearance: He is obese.  °HENT:  °   Head: Normocephalic.  °   Mouth/Throat:  °   Mouth: Mucous membranes are moist.  °Eyes:  °   Pupils: Pupils are equal, round, and reactive to light.  °Cardiovascular:  °   Rate and Rhythm: Normal rate and regular rhythm.  °   Heart sounds: No   murmur heard.   No friction rub.  Pulmonary:     Effort: No respiratory distress.     Breath sounds: No stridor. No wheezing or rhonchi.  Musculoskeletal:     Cervical back: No rigidity or tenderness.  Neurological:     Mental Status: He is alert.  Psychiatric:        Mood and Affect: Mood normal.    Data Reviewed: CT scan 10/06/2021 was reviewed with the patient showing atelectasis left base, and had a CT scan of his abdomen 03/07/2020 that she did not reveal atelectasis at the lung base  Assessment:  Atelectasis -Left base -There does not appear to be an endobronchial lesion however there is significant collapse -Not coughing at present, no hemoptysis  Shortness of breath with activity -This may be related to deconditioning -May be related to restrictive physiology  Some element of deconditioning may be contributing to shortness of breath  Patient never smoked, no weight loss, no personal history of cancer  Plan/Recommendations: Patient will benefit from diagnostic bronchoscopy to ensure that there is no endobronchial lesions  Schedule patient for pulmonary function tests  I will see him in about 6 to 8 weeks  Did discuss case with Dr. Valeta Harms who will will be able to schedule patient for bronchoscopy  Encouraged to continue regular exercises, he does have an incentive spirometer that he uses at home as well encouraged to use it regularly   Sherrilyn Rist MD [facility] 10/26/2021, 3:02 PM  CC: Ria Bush, MD

## 2021-10-26 NOTE — Progress Notes (Signed)
Roger Mcdaniel    [id number]    12-Jan-1951  Primary Care Physician:Gutierrez, Garlon Hatchet, MD  Referring Physician: Ria Bush, MD [address] [place],   [address]  Chief complaint:   Recently seen in the emergency room for chest discomfort  HPI:  Recent CT shows atelectasis left lower lobe  He had had a chest injury in November fell and injured his chest left him with a bruise.  Did not have any discomfort apart from just bruising on his chest.  A few weeks later did have some chest pain and he went to the hospital to get evaluated.  He had a CT scan of his chest showing atelectasis at the base.  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  He has hypertension, diabetes, hypercholesterolemia  Outpatient Encounter Medications as of 10/26/2021  Medication Sig   aspirin EC 81 MG tablet Take 1 tablet (81 mg total) by mouth daily. Swallow whole.   atorvastatin (LIPITOR) 40 MG tablet Take 1 tablet (40 mg total) by mouth daily.   blood glucose meter kit and supplies Fill One touch meter that is covered. Check sugar once daily. Dx E11.9   gabapentin (NEURONTIN) 300 MG capsule Take 300 mg by mouth 2 (two) times daily.   Lancets (ONETOUCH DELICA PLUS [id number]) MISC CHECK SUGAR ONCE DAILY DX E11.9   lisinopril (ZESTRIL) 10 MG tablet Take 1 tablet (10 mg total) by mouth daily.   Magnesium 250 MG TABS Take 250 mg by mouth daily.   metFORMIN (GLUCOPHAGE) 500 MG tablet Take 1 tablet (500 mg total) by mouth 2 (two) times daily with a meal.   nitroGLYCERIN (NITROSTAT) 0.4 MG SL tablet Place 1 tablet (0.4 mg total) under the tongue every 5 (five) minutes as needed for chest pain.   ONETOUCH  ULTRA test strip CHECK SUGAR ONCE DAILY DX E111.9   vitamin C (ASCORBIC ACID) 500 MG tablet Take 1 tablet (500 mg total) by mouth 2 (two) times daily.   zinc gluconate 50 MG tablet Take 50 mg by mouth daily.   No facility-administered encounter medications on file as of 10/26/2021.    Allergies as of 10/26/2021 - Review Complete 10/26/2021  Allergen Reaction Noted   Codeine sulfate Nausea Only 07/10/2008   Pravachol [pravastatin sodium] Other (See Comments) 07/19/2012    Past Medical History:  Diagnosis Date   Bruises easily    Chest pain    CMC arthritis, thumb, degenerative    DDD (degenerative disc disease), cervical 2015   s/o ACDF [facility])   DM type 2 (diabetes mellitus, type 2) ([place])    Dry skin    GERD 07/10/2008   Qualifier: Diagnosis of  By: Council Mechanic MD, Hilaria Ota    GERD (gastroesophageal reflux disease) 09/2003   History of ETT 05/23/2000   Nml (Dr. Acie Fredrickson)   History of kidney stones    HNP (herniated nucleus pulposus), cervical 12/20/2013   Hyperlipidemia 09/2003   Hypertension 06/2004   OSA (obstructive sleep apnea)    was on CPAP, not used  in 25-30 years     Past Surgical History:  Procedure Laterality Date   ANTERIOR CERVICAL DECOMP/DISCECTOMY FUSION N/A  01/14/2014   Procedure: ANTERIOR CERVICAL DECOMPRESSION/DISCECTOMY FUSION CERVICAL FOUR-FIVE,CERVICAL FIVE-SIX ;  Surgeon: Hosie Spangle, MD   CATARACT EXTRACTION Right 02/28/2019   Per patient   CATARACT EXTRACTION Left 03/12/2019   Per patient   CYSTOSCOPY/URETEROSCOPY/HOLMIUM LASER/STENT PLACEMENT Right 05/28/2020   Procedure: CYSTOSCOPY RIGHT URETEROSCOPY/HOLMIUM LASER/STENT PLACEMENT STONE BASKET RETRACTION;  Surgeon: Lucas Mallow, MD;  Location: [facility];  Service: Urology;  Laterality: Right;   EXTRACORPOREAL SHOCK WAVE LITHOTRIPSY Right 03/17/2020   Procedure: RIGHT EXTRACORPOREAL SHOCK WAVE LITHOTRIPSY (ESWL);  Surgeon: Ceasar Mons, MD;  Location: [facility];  Service: Urology;  Laterality: Right;   KNEE ARTHROPLASTY Right 11/27/2020   Procedure: COMPUTER ASSISTED TOTAL KNEE ARTHROPLASTY;  Surgeon: Rod Can, MD;  Location[facility];  Service: Orthopedics;  Laterality: Right;   ORIF ANKLE FRACTURE  1980   right   RETINAL LASER PROCEDURE Right 03/14/2019   Per patient   SEPTOPLASTY  2005   for sleep apnea //cpap   SHOULDER SURGERY Right 06/2017   Dr Onnie Graham at [facility] ARTHROPLASTY Right 11/2018   R CMC and R MCP arthroplasty ([facility])    Family History  Problem Relation Age of Onset   Heart disease Father        MI x 2   Hypertension Brother    Obesity Brother    Heart disease Paternal Grandfather         MI   CVA Mother    Cancer Other     Social History   Socioeconomic History   Marital status: Widowed    Spouse name: Not on file   Number of children: Not on file   Years of education: Not on file   Highest education level: Not on file  Occupational History   Occupation: Data processing manager    Comment: Primary school teacher  Tobacco Use   Smoking status: Never   Smokeless tobacco: Never  Vaping Use   Vaping Use: Never used  Substance and Sexual Activity   Alcohol use: Not Currently    Alcohol/week: 1.0 standard drink    Types: 1 Standard drinks or equivalent per week   Drug use: No   Sexual activity: Yes  Other Topics Concern   Not on file  Social History Narrative   Married. Widower - wife passed after leg surgery 11/2014   Children: 2 out of house   Occ: retired, was Software engineer of [employer]   Activity: no regular exercise   Social Determinants of Radio broadcast assistant Strain: Not on [employer] Insecurity: Not on file  Transportation Needs: Not on file  Physical Activity: Not on file  Stress: Not on file  Social Connections: Not on file  Intimate Partner Violence: Not on file    Review of Systems  Constitutional:  Negative  for fatigue.  Psychiatric/Behavioral:  Positive for sleep disturbance.    Vitals:   10/26/21 1435  BP: 124/80  Pulse: 70  Temp: 98.3 F (36.8 C)  SpO2: 99%     Physical Exam Constitutional:      Appearance: He is obese.  HENT:     Head: Normocephalic.     Mouth/Throat:     Mouth: Mucous membranes are moist.  Eyes:     Pupils: Pupils are equal, round, and reactive to light.  Cardiovascular:     Rate and Rhythm: Normal rate and regular rhythm.     Heart sounds: No  murmur heard.   No friction rub.  Pulmonary:     Effort: No respiratory distress.     Breath sounds: No stridor. No wheezing or rhonchi.  Musculoskeletal:     Cervical back: No rigidity or tenderness.  Neurological:     Mental Status: He is alert.  Psychiatric:        Mood and Affect: Mood normal.    Data Reviewed: CT scan 10/06/2021 was reviewed with the patient showing atelectasis left base, and had a CT scan of his abdomen 03/07/2020 that she did not reveal atelectasis at the lung base  Assessment:  Atelectasis -Left base -There does not appear to be an endobronchial lesion however there is significant collapse -Not coughing at present, no hemoptysis  Shortness of breath with activity -This may be related to deconditioning -May be related to restrictive physiology  Some element of deconditioning may be contributing to shortness of breath  Patient never smoked, no weight loss, no personal history of cancer  Plan/Recommendations: Patient will benefit from diagnostic bronchoscopy to ensure that there is no endobronchial lesions  Schedule patient for pulmonary function tests  I will see him in about 6 to 8 weeks  Did discuss case with Dr. Valeta Harms who will will be able to schedule patient for bronchoscopy  Encouraged to continue regular exercises, he does have an incentive spirometer that he uses at home as well encouraged to use it regularly   Sherrilyn Rist MD [facility] 10/26/2021, 3:02 PM  CC: Ria Bush, MD

## 2021-10-27 ENCOUNTER — Telehealth: Payer: Self-pay | Admitting: Pulmonary Disease

## 2021-10-27 NOTE — Telephone Encounter (Signed)
I scheduled pt on 2/7 at 4:15 at [facility].  Pt will go for covid test on 2/3.  Spoke to pt & gave him appt info.

## 2021-10-27 NOTE — Telephone Encounter (Signed)
Per message from [place] pt rescheduled to 2/9 at 7:30.  Spoke to pt & gave him appt info.  He will go for covid test on 2/6.

## 2021-10-28 HISTORY — PX: FLEXIBLE BRONCHOSCOPY: SUR164

## 2021-11-02 ENCOUNTER — Other Ambulatory Visit: Payer: Self-pay | Admitting: Pulmonary Disease

## 2021-11-02 LAB — SARS CORONAVIRUS 2 (TAT 6-24 HRS): SARS Coronavirus 2: NEGATIVE

## 2021-11-03 ENCOUNTER — Other Ambulatory Visit: Payer: Self-pay

## 2021-11-03 ENCOUNTER — Encounter (HOSPITAL_COMMUNITY): Payer: Self-pay | Admitting: Pulmonary Disease

## 2021-11-03 DIAGNOSIS — J9811 Atelectasis: Secondary | ICD-10-CM | POA: Insufficient documentation

## 2021-11-03 NOTE — Progress Notes (Signed)
PCP - Dr. Sharen Hones Cardiologist - Dr. Elease Hashimoto EKG - 09/22/21 Chest x-ray - 09/17/21 ECHO - >10 years ago Cardiac Cath - denies CPAP - denies Fasting Blood Sugar:  90-105 Checks Blood Sugar:  1x/day Blood Thinner Instructions:  Aspirin Instructions: Follow your surgeon's instructions on when to stop Aspirin.  If no instructions were given by your surgeon then you will need to call the office to get those instructions.   ERAS Protcol - n/a COVID TEST- negative 2/6  Anesthesia review: n/a  -------------  SDW INSTRUCTIONS:  Your procedure is scheduled on Thursday 2/9. Please report to [facility] Main Entrance "A" at 0530 A.M., and check in at the Admitting office. Call this number if you have problems the morning of surgery: [phone]   Remember: Do not eat or drink after midnight the night before your surgery  Medications to take morning of surgery with a sip of water include: atorvastatin (LIPITOR) gabapentin (NEURONTIN)  nitroGLYCERIN (NITROSTAT) if needed  ** PLEASE check your blood sugar the morning of your surgery when you wake up and every 2 hours until you get to the Short Stay unit.  If your blood sugar is less than 70 mg/dL, you will need to treat for low blood sugar: Do not take insulin. Treat a low blood sugar (less than 70 mg/dL) with  cup of clear juice (cranberry or apple), 4 glucose tablets, OR glucose gel. Recheck blood sugar in 15 minutes after treatment (to make sure it is greater than 70 mg/dL). If your blood sugar is not greater than 70 mg/dL on recheck, call [phone] for further instructions.  metFORMIN (GLUCOPHAGE)  none DOS  As of today, STOP taking any Aspirin (unless otherwise instructed by your surgeon), Aleve, Naproxen, Ibuprofen, Motrin, Advil, Goody's, BC's, all herbal medications, fish oil, and all vitamins.    The Morning of Surgery Do not wear jewelry Do not wear lotions, powders, colognes, or deodorant Do not bring valuables to the  hospital. [facility] is not responsible for any belongings or valuables.  If you are a smoker, DO NOT Smoke 24 hours prior to surgery  If you wear a CPAP at night please bring your mask the morning of surgery   Remember that you must have someone to transport you home after your surgery, and remain with you for 24 hours if you are discharged the same day.  Please bring cases for contacts, glasses, hearing aids, dentures or bridgework because it cannot be worn into surgery.   Patients discharged the day of surgery will not be allowed to drive home.   Please shower the NIGHT BEFORE/MORNING OF SURGERY (use antibacterial soap like DIAL soap if possible). Wear comfortable clothes the morning of surgery. Oral Hygiene is also important to reduce your risk of infection.  Remember - BRUSH YOUR TEETH THE MORNING OF SURGERY WITH YOUR REGULAR TOOTHPASTE  Patient denies shortness of breath, fever, cough and chest pain.

## 2021-11-04 NOTE — Anesthesia Preprocedure Evaluation (Addendum)
Anesthesia Evaluation  Patient identified by MRN, date of birth, ID band Patient awake    Reviewed: Allergy & Precautions, NPO status , Patient's Chart, lab work & pertinent test results  Airway Mallampati: II  TM Distance: >3 FB Neck ROM: Full    Dental no notable dental hx. (+) Dental Advisory Given   Pulmonary sleep apnea ,    Pulmonary exam normal breath sounds clear to auscultation       Cardiovascular hypertension, Pt. on medications Normal cardiovascular exam Rhythm:Regular Rate:Normal  ECG: SR, rate 60   Neuro/Psych negative neurological ROS  negative psych ROS   GI/Hepatic Neg liver ROS, GERD  ,  Endo/Other  diabetes, Oral Hypoglycemic Agents  Renal/GU Renal disease     Musculoskeletal  (+) Arthritis ,   Abdominal (+) + obese,   Peds  Hematology HLD   Anesthesia Other Findings right knee degenerative joint disease  Reproductive/Obstetrics                            Anesthesia Physical  Anesthesia Plan  ASA: 3  Anesthesia Plan: General   Post-op Pain Management:  [facility] for Post-op pain and Minimal or no pain anticipated   Induction: Intravenous  PONV Risk Score and Plan: 2 and Ondansetron, Dexamethasone, Midazolam and Treatment may vary due to age or medical condition  Airway Management Planned: Oral ETT  Additional Equipment:   Intra-op Plan:   Post-operative Plan: Extubation in OR  Informed Consent: I have reviewed the patients History and Physical, chart, labs and discussed the procedure including the risks, benefits and alternatives for the proposed anesthesia with the patient or authorized representative who has indicated his/her understanding and acceptance.     Dental advisory given  Plan Discussed with: CRNA  Anesthesia Plan Comments:        Anesthesia Quick Evaluation

## 2021-11-05 ENCOUNTER — Ambulatory Visit (HOSPITAL_COMMUNITY): Payer: Medicare Other | Admitting: Anesthesiology

## 2021-11-05 ENCOUNTER — Encounter (HOSPITAL_COMMUNITY)
Admission: RE | Disposition: A | Discharge status: Home / Self Care | Payer: Self-pay | Source: Home / Self Care | Attending: Pulmonary Disease

## 2021-11-05 ENCOUNTER — Ambulatory Visit (HOSPITAL_COMMUNITY)
Admission: RE | Admit: 2021-11-05 | Discharge: 2021-11-05 | Disposition: A | Discharge status: Home / Self Care | Payer: Medicare Other | Attending: Pulmonary Disease | Admitting: Pulmonary Disease

## 2021-11-05 ENCOUNTER — Encounter (HOSPITAL_COMMUNITY): Payer: Self-pay | Admitting: Pulmonary Disease

## 2021-11-05 ENCOUNTER — Ambulatory Visit (HOSPITAL_BASED_OUTPATIENT_CLINIC_OR_DEPARTMENT_OTHER): Payer: Medicare Other | Admitting: Anesthesiology

## 2021-11-05 DIAGNOSIS — E7800 Pure hypercholesterolemia, unspecified: Secondary | ICD-10-CM | POA: Insufficient documentation

## 2021-11-05 DIAGNOSIS — K219 Gastro-esophageal reflux disease without esophagitis: Secondary | ICD-10-CM | POA: Insufficient documentation

## 2021-11-05 DIAGNOSIS — I10 Essential (primary) hypertension: Secondary | ICD-10-CM | POA: Insufficient documentation

## 2021-11-05 DIAGNOSIS — Z6836 Body mass index (BMI) 36.0-36.9, adult: Secondary | ICD-10-CM | POA: Diagnosis not present

## 2021-11-05 DIAGNOSIS — E119 Type 2 diabetes mellitus without complications: Secondary | ICD-10-CM | POA: Insufficient documentation

## 2021-11-05 DIAGNOSIS — J9819 Other pulmonary collapse: Secondary | ICD-10-CM | POA: Diagnosis present

## 2021-11-05 DIAGNOSIS — G4730 Sleep apnea, unspecified: Secondary | ICD-10-CM | POA: Diagnosis not present

## 2021-11-05 DIAGNOSIS — E669 Obesity, unspecified: Secondary | ICD-10-CM | POA: Diagnosis not present

## 2021-11-05 DIAGNOSIS — Z7984 Long term (current) use of oral hypoglycemic drugs: Secondary | ICD-10-CM | POA: Diagnosis not present

## 2021-11-05 DIAGNOSIS — J9811 Atelectasis: Secondary | ICD-10-CM | POA: Diagnosis not present

## 2021-11-05 HISTORY — PX: VIDEO BRONCHOSCOPY: SHX5072

## 2021-11-05 HISTORY — PX: BRONCHIAL WASHINGS: SHX5105

## 2021-11-05 LAB — CBC
HCT: 45.1 % (ref 39.0–52.0)
Hemoglobin: 14.6 g/dL (ref 13.0–17.0)
MCH: 29.7 pg (ref 26.0–34.0)
MCHC: 32.4 g/dL (ref 30.0–36.0)
MCV: 91.9 fL (ref 80.0–100.0)
Platelets: 256 10*3/uL (ref 150–400)
RBC: 4.91 MIL/uL (ref 4.22–5.81)
RDW: 13.2 % (ref 11.5–15.5)
WBC: 7.4 10*3/uL (ref 4.0–10.5)
nRBC: 0 % (ref 0.0–0.2)

## 2021-11-05 LAB — BASIC METABOLIC PANEL
Anion gap: 7 (ref 5–15)
BUN: 15 mg/dL (ref 8–23)
CO2: 29 mmol/L (ref 22–32)
Calcium: 9 mg/dL (ref 8.9–10.3)
Chloride: 102 mmol/L (ref 98–111)
Creatinine, Ser: 0.94 mg/dL (ref 0.61–1.24)
GFR, Estimated: >60 mL/min (ref >60)
Glucose, Bld: 120 mg/dL — ABNORMAL HIGH (ref 70–99)
Potassium: 4.8 mmol/L (ref 3.5–5.1)
Sodium: 138 mmol/L (ref 135–145)

## 2021-11-05 LAB — GLUCOSE, CAPILLARY
Glucose-Capillary: 107 mg/dL — ABNORMAL HIGH (ref 70–99)
Glucose-Capillary: 114 mg/dL — ABNORMAL HIGH (ref 70–99)

## 2021-11-05 SURGERY — VIDEO BRONCHOSCOPY WITHOUT FLUORO
Anesthesia: General | Laterality: Left

## 2021-11-05 MED ORDER — FENTANYL CITRATE (PF) 100 MCG/2ML IJ SOLN
INTRAMUSCULAR | Status: AC
  Filled 2021-11-05: qty 2

## 2021-11-05 MED ORDER — ONDANSETRON HCL 4 MG/2ML IJ SOLN
INTRAMUSCULAR | Status: DC | PRN
  Administered 2021-11-05: 4 mg via INTRAVENOUS

## 2021-11-05 MED ORDER — MIDAZOLAM HCL 2 MG/2ML IJ SOLN
INTRAMUSCULAR | Status: AC
  Filled 2021-11-05: qty 2

## 2021-11-05 MED ORDER — LIDOCAINE 2% (20 MG/ML) 5 ML SYRINGE
INTRAMUSCULAR | Status: DC | PRN
  Administered 2021-11-05: 80 mg via INTRAVENOUS

## 2021-11-05 MED ORDER — SUGAMMADEX SODIUM 200 MG/2ML IV SOLN
INTRAVENOUS | Status: DC | PRN
  Administered 2021-11-05: 400 mg via INTRAVENOUS

## 2021-11-05 MED ORDER — CHLORHEXIDINE GLUCONATE 0.12 % MT SOLN
OROMUCOSAL | Status: AC
  Filled 2021-11-05: qty 15

## 2021-11-05 MED ORDER — PROPOFOL 10 MG/ML IV BOLUS
INTRAVENOUS | Status: DC | PRN
Start: 2021-11-05 — End: 2021-11-05
  Administered 2021-11-05: 14 mg via INTRAVENOUS

## 2021-11-05 MED ORDER — FENTANYL CITRATE (PF) 250 MCG/5ML IJ SOLN
INTRAMUSCULAR | Status: DC | PRN
  Administered 2021-11-05: 50 ug via INTRAVENOUS

## 2021-11-05 MED ORDER — DEXAMETHASONE SODIUM PHOSPHATE 10 MG/ML IJ SOLN
INTRAMUSCULAR | Status: DC | PRN
  Administered 2021-11-05: 4 mg via INTRAVENOUS

## 2021-11-05 MED ORDER — LACTATED RINGERS IV SOLN: INTRAVENOUS | Status: DC

## 2021-11-05 MED ORDER — ROCURONIUM BROMIDE 10 MG/ML (PF) SYRINGE
PREFILLED_SYRINGE | INTRAVENOUS | Status: DC | PRN
  Administered 2021-11-05: 50 mg via INTRAVENOUS

## 2021-11-05 NOTE — Transfer of Care (Signed)
Immediate Anesthesia Transfer of Care Note  Patient: Roger Mcdaniel  Procedure(s) Performed: VIDEO BRONCHOSCOPY WITHOUT FLUORO (Bilateral) BRONCHIAL WASHINGS (Left)  Patient Location: PACU  Anesthesia Type:General  Level of Consciousness: awake, alert  and oriented  Airway & Oxygen Therapy: Patient Spontanous Breathing  Post-op Assessment: Report given to RN and Post -op Vital signs reviewed and stable  Post vital signs: Reviewed and stable  Last Vitals:  Vitals Value Taken Time  BP 150/74 11/05/21 0813  Temp    Pulse 72 11/05/21 0814  Resp 21 11/05/21 0814  SpO2 96 % 11/05/21 0814  Vitals shown include unvalidated device data.  Last Pain:  Vitals:   11/05/21 0644  TempSrc:   PainSc: 0-No pain         Complications: No notable events documented.

## 2021-11-05 NOTE — Anesthesia Postprocedure Evaluation (Signed)
Anesthesia Post Note  Patient: Roger Mcdaniel  Procedure(s) Performed: VIDEO BRONCHOSCOPY WITHOUT FLUORO (Bilateral) BRONCHIAL WASHINGS (Left)     Patient location during evaluation: PACU Anesthesia Type: General Level of consciousness: sedated and patient cooperative Pain management: pain level controlled Vital Signs Assessment: post-procedure vital signs reviewed and stable Respiratory status: spontaneous breathing Cardiovascular status: stable Anesthetic complications: no   No notable events documented.  Last Vitals:  Vitals:   11/05/21 0813 11/05/21 0828  BP: (!) 150/74 126/67  Pulse: 77 69  Resp:  19  Temp: 36.7 C 36.7 C  SpO2: 96% 95%    Last Pain:  Vitals:   11/05/21 0828  TempSrc:   PainSc: 0-No pain                 Lewie Loron

## 2021-11-05 NOTE — Interval H&P Note (Signed)
History and Physical Interval Note:  11/05/2021 7:29 AM  Roger Mcdaniel  has presented today for surgery, with the diagnosis of atelectasis.  The various methods of treatment have been discussed with the patient and family. After consideration of risks, benefits and other options for treatment, the patient has consented to  Procedure(s) with comments: VIDEO BRONCHOSCOPY WITHOUT FLUORO (Bilateral) - Cytology not needed as a surgical intervention.  The patient's history has been reviewed, patient examined, no change in status, stable for surgery.  I have reviewed the patient's chart and labs.  Questions were answered to the patient's satisfaction.     Rachel Bo Keyanni [place]

## 2021-11-05 NOTE — Anesthesia Procedure Notes (Signed)
Procedure Name: Intubation Date/Time: 11/05/2021 7:51 AM Performed by: Inda Coke, CRNA Pre-anesthesia Checklist: Patient identified, Emergency Drugs available, Suction available and Patient being monitored Patient Re-evaluated:Patient Re-evaluated prior to induction Oxygen Delivery Method: Circle System Utilized Preoxygenation: Pre-oxygenation with 100% oxygen Induction Type: IV induction Ventilation: Mask ventilation without difficulty Laryngoscope Size: Mac and 4 Grade View: Grade I Tube type: Oral Tube size: 8.5 mm Number of attempts: 1 Airway Equipment and Method: Stylet and Oral airway Placement Confirmation: ETT inserted through vocal cords under direct vision, positive ETCO2 and breath sounds checked- equal and bilateral Secured at: 23 cm Tube secured with: Tape Dental Injury: Teeth and Oropharynx as per pre-operative assessment

## 2021-11-05 NOTE — Discharge Instructions (Signed)
Flexible Bronchoscopy, Care After This sheet gives you information about how to care for yourself after your test. Your doctor may also give you more specific instructions. If you have problems or questions, contact your doctor. Follow these instructions at home: Eating and drinking Do not eat or drink anything (not even water) for 2 hours after your test, or until your numbing medicine (local anesthetic) wears off. When your numbness is gone and your cough and gag reflexes have come back, you may: Eat only soft foods. Slowly drink liquids. The day after the test, go back to your normal diet. Driving Do not drive for 24 hours if you were given a medicine to help you relax (sedative). Do not drive or use heavy machinery while taking prescription pain medicine. General instructions  Take over-the-counter and prescription medicines only as told by your doctor. Return to your normal activities as told. Ask what activities are safe for you. Do not use any products that have nicotine or tobacco in them. This includes cigarettes and e-cigarettes. If you need help quitting, ask your doctor. Keep all follow-up visits as told by your doctor. This is important. It is very important if you had a tissue sample (biopsy) taken. Get help right away if: You have shortness of breath that gets worse. You get light-headed. You feel like you are going to pass out (faint). You have chest pain. You cough up: More than a little blood. More blood than before. Summary Do not eat or drink anything (not even water) for 2 hours after your test, or until your numbing medicine wears off. Do not use cigarettes. Do not use e-cigarettes. Get help right away if you have chest pain.  This information is not intended to replace advice given to you by your health care provider. Make sure you discuss any questions you have with your health care provider. Document Released: 07/11/2009 Document Revised: 08/26/2017 Document  Reviewed: 10/01/2016 Elsevier Patient Education  2020 [employer].

## 2021-11-05 NOTE — Op Note (Signed)
Video Bronchoscopy Procedure Note  Date of Operation: 11/05/2021  Pre-op Diagnosis: Left lower lobe collapse   Post-op Diagnosis: Left lower lobe colapse   Surgeon: Garner Nash, DO   Assistants: none  Anesthesia: general   Operation: Flexible video fiberoptic bronchoscopy and biopsies.  Estimated Blood Loss: <5 cc  Complications: none noted  Indications and History: Roger Mcdaniel is 71 y.o. with history of LLL collapse.  Recommendation was to perform video fiberoptic bronchoscopy with biopsies. The risks, benefits, complications, treatment options and expected outcomes were discussed with the patient.  The possibilities of pneumothorax, pneumonia, reaction to medication, pulmonary aspiration, perforation of a viscus, bleeding, failure to diagnose a condition and creating a complication requiring transfusion or operation were discussed with the patient who freely signed the consent.    Description of Procedure: The patient was seen in the Preoperative Area, was examined and was deemed appropriate to proceed.  The patient was taken to [facility] Room 3, identified as Roger Mcdaniel and the procedure verified as Flexible Video Fiberoptic Bronchoscopy.  A Time Out was held and the above information confirmed.   Standard bronchoscope was inserted through the patient's endotracheal tube and airways were examined.  The right and left lung both appeared normal, the main carina appeared normal and there was no evidence of endobronchial lesion.  A BAL was performed in the left lower lobe with moderate return to be sent for microbiology specimens.  Bronchoscope was brought to just above the main carina there was no evidence of active bleeding.  Samples: 1.  Bronchoalveolar lavage left lower lobe  Plans:  We will review the cytology, pathology results with the patient when they become available.  Outpatient followup will be with Dr. Ander Slade.    Garner Nash, DO [facility] 11/05/2021 8:05 AM

## 2021-11-06 LAB — ACID FAST SMEAR (AFB, MYCOBACTERIA): Acid Fast Smear: NEGATIVE

## 2021-11-08 ENCOUNTER — Encounter (HOSPITAL_COMMUNITY): Payer: Self-pay | Admitting: Pulmonary Disease

## 2021-11-08 ENCOUNTER — Encounter: Payer: Self-pay | Admitting: Pulmonary Disease

## 2021-11-08 LAB — AEROBIC/ANAEROBIC CULTURE W GRAM STAIN (SURGICAL/DEEP WOUND)
Culture: NORMAL
Gram Stain: NONE SEEN

## 2021-11-09 NOTE — Telephone Encounter (Signed)
Dr. Icard, please see mychart message sent by pt and advise. ?

## 2021-11-11 MED ORDER — AMOXICILLIN-POT CLAVULANATE 875-125 MG PO TABS: 1.0000 | ORAL_TABLET | Freq: Two times a day (BID) | ORAL | 0 refills | Status: DC

## 2021-11-11 NOTE — Progress Notes (Signed)
Please see my chart message. I sent him a message already. Please make sure he has follow up with Dr. Wynona Neat in 2-3 months   Thanks,  BLI  Josephine Igo, DO [facility] 11/11/2021 11:15 AM

## 2021-11-16 DIAGNOSIS — M65341 Trigger finger, right ring finger: Secondary | ICD-10-CM | POA: Diagnosis not present

## 2021-11-16 DIAGNOSIS — M65342 Trigger finger, left ring finger: Secondary | ICD-10-CM | POA: Diagnosis not present

## 2021-11-23 ENCOUNTER — Ambulatory Visit (INDEPENDENT_AMBULATORY_CARE_PROVIDER_SITE_OTHER): Payer: Medicare Other | Admitting: Family Medicine

## 2021-11-23 ENCOUNTER — Other Ambulatory Visit: Payer: Self-pay

## 2021-11-23 ENCOUNTER — Encounter: Payer: Self-pay | Admitting: Family Medicine

## 2021-11-23 VITALS — BP 140/76 | HR 68 | Temp 98.6°F | Ht 64.0 in | Wt 222.0 lb

## 2021-11-23 DIAGNOSIS — J9811 Atelectasis: Secondary | ICD-10-CM | POA: Diagnosis not present

## 2021-11-23 DIAGNOSIS — I10 Essential (primary) hypertension: Secondary | ICD-10-CM | POA: Diagnosis not present

## 2021-11-23 DIAGNOSIS — E118 Type 2 diabetes mellitus with unspecified complications: Secondary | ICD-10-CM | POA: Diagnosis not present

## 2021-11-23 NOTE — Patient Instructions (Addendum)
You are doing well today Continue current medicines.  Return as needed or in 6 months for physical.  

## 2021-11-23 NOTE — Progress Notes (Signed)
Patient ID: Roger Mcdaniel, male    DOB: 12-Jun-1951, 71 y.o.   MRN: [id number]  This visit was conducted in person.  BP 140/76 (BP Location: Right Arm, Cuff Size: Large)    Pulse 68    Temp 98.6 F (37 C)    Ht _0  (1.626 m)    Wt 222 lb (100.7 kg)    SpO2 98%    BMI 38.11 kg/m    CC: 6 mo DM f/u visit  Subjective:   HPI: Roger Mcdaniel is a 71 y.o. male presenting on 11/23/2021 for Diabetes   S/p steroid injection for R hand trigger finger (Dr Amedeo Plenty).   Recent lung collapse after fall 09/27/2020. He saw pulmonology s/p video bronchoscopy, culture grew rare Prevotella melaninogenica s/p augemntin course. Has f/u planned next week with pulmonology.   HTN - Compliant with current antihypertensive regimen of lisinopril 29m daily.  Does check blood pressures at home: 125-135/75-80. No low blood pressure readings or symptoms of dizziness/syncope. Denies HA, vision changes, CP/tightness, SOB, leg swelling.   DM - does regularly check fasting sugars 100-115. Compliant with antihyperglycemic regimen which includes: metformin 5076mbid. Denies low sugars or hypoglycemic symptoms. Denies paresthesias, blurry vision. Last diabetic eye exam 04/2021. Glucometer brand: one touch delica. Last foot exam: 10/2020 - DUE. [facility]  Lab Results  Component Value Date   HGBA1C 6.4 09/29/2021   Diabetic Foot Exam - Simple   Simple Foot Form Diabetic Foot exam was performed with the following findings: Yes 11/23/2021  8:05 AM  Visual Inspection No deformities, no ulcerations, no other skin breakdown bilaterally: Yes Sensation Testing Intact to touch and monofilament testing bilaterally: Yes Pulse Check Posterior Tibialis and Dorsalis pulse intact bilaterally: Yes Comments    Lab Results  Component Value Date   MICROALBUR 4.5 (H) 04/27/2011        Relevant past medical, surgical, family and social history reviewed and updated as indicated. Interim medical history since  our last visit reviewed. Allergies and medications reviewed and updated. Outpatient Medications Prior to Visit  Medication Sig Dispense Refill   aspirin EC 81 MG tablet Take 1 tablet (81 mg total) by mouth daily. Swallow whole. 30 tablet 0   atorvastatin (LIPITOR) 40 MG tablet Take 1 tablet (40 mg total) by mouth daily. 90 tablet 3   blood glucose meter kit and supplies Fill One touch meter that is covered. Check sugar once daily. Dx E11.9 1 each 0   gabapentin (NEURONTIN) 300 MG capsule Take 300 mg by mouth 2 (two) times daily.     Lancets (ONETOUCH DELICA PLUS [id number] CHECK SUGAR ONCE DAILY DX E11.9 100 each 3   lisinopril (ZESTRIL) 10 MG tablet Take 1 tablet (10 mg total) by mouth daily. 90 tablet 3   Magnesium 250 MG TABS Take 250 mg by mouth daily.     metFORMIN (GLUCOPHAGE) 500 MG tablet Take 1 tablet (500 mg total) by mouth 2 (two) times daily with a meal. 180 tablet 3   nitroGLYCERIN (NITROSTAT) 0.4 MG SL tablet Place 1 tablet (0.4 mg total) under the tongue every 5 (five) minutes as needed for chest pain. 30 tablet 0   ONETOUCH ULTRA test strip CHECK SUGAR ONCE DAILY DX E111.9 100 strip 3   vitamin C (ASCORBIC ACID) 500 MG tablet Take 1 tablet (500 mg total) by mouth 2 (two) times daily.     zinc gluconate 50 MG tablet Take 50 mg by mouth daily.  amoxicillin-clavulanate (AUGMENTIN) 875-125 MG tablet Take 1 tablet by mouth 2 (two) times daily. 14 tablet 0   No facility-administered medications prior to visit.     Per HPI unless specifically indicated in ROS section below Review of Systems  Objective:  BP 140/76 (BP Location: Right Arm, Cuff Size: Large)    Pulse 68    Temp 98.6 F (37 C)    Ht _0  (1.626 m)    Wt 222 lb (100.7 kg)    SpO2 98%    BMI 38.11 kg/m   Wt Readings from Last 3 Encounters:  11/23/21 222 lb (100.7 kg)  11/05/21 212 lb (96.2 kg)  10/26/21 219 lb (99.3 kg)      Physical Exam Vitals and nursing note reviewed.  Constitutional:       Appearance: Normal appearance. He is not ill-appearing.  Eyes:     Extraocular Movements: Extraocular movements intact.     Conjunctiva/sclera: Conjunctivae normal.     Pupils: Pupils are equal, round, and reactive to light.  Cardiovascular:     Rate and Rhythm: Normal rate and regular rhythm.     Pulses: Normal pulses.     Heart sounds: Normal heart sounds. No murmur heard. Pulmonary:     Effort: Pulmonary effort is normal. No respiratory distress.     Breath sounds: Normal breath sounds. No wheezing, rhonchi or rales.  Musculoskeletal:     Right lower leg: No edema.     Left lower leg: No edema.     Comments: See HPI for foot exam if done  Skin:    General: Skin is warm and dry.     Findings: No rash.  Neurological:     Mental Status: He is alert.  Psychiatric:        Mood and Affect: Mood normal.        Behavior: Behavior normal.      Results for orders placed or performed during the hospital encounter of 11/05/21  Fungus Culture With Stain   Specimen: PATH Cytology washing; Body Fluid  Result Value Ref Range   Fungus Stain Final report    Fungus (Mycology) Culture PENDING    Fungal Source CYTO [facility] A   Aerobic/Anaerobic Culture w Gram Stain (surgical/deep wound)   Specimen: PATH Cytology washing; Body Fluid  Result Value Ref Range   Specimen Description BRONCHIAL ALVEOLAR LAVAGE    Special Requests LEFT LOWER LOBE BAL SPEC A    Gram Stain      NO SQUAMOUS EPITHELIAL CELLS SEEN FEW WBC SEEN NO ORGANISMS SEEN    Culture      RARE Normal respiratory flora-no Staph aureus or Pseudomonas seen RARE PREVOTELLA MELANINOGENICA BETA LACTAMASE POSITIVE Performed at [facility], [place] [address] [place], [place] [address]    Report Status 11/08/2021 FINAL   Acid Fast Smear (AFB)   Specimen: PATH Cytology washing; Body Fluid  Result Value Ref Range   AFB Specimen Processing Concentration    Acid Fast Smear Negative    Source (AFB) LEFT LOWER LOBE BAL   Fungus  Culture Result  Result Value Ref Range   Result 1 Comment   Basic metabolic panel per protocol  Result Value Ref Range   Sodium 138 135 - 145 mmol/L   Potassium 4.8 3.5 - 5.1 mmol/L   Chloride 102 98 - 111 mmol/L   CO2 29 22 - 32 mmol/L   Glucose, Bld 120 (H) 70 - 99 mg/dL   BUN 15 8 -  23 mg/dL   Creatinine, Ser 0.94 0.61 - 1.24 mg/dL   Calcium 9.0 8.9 - 10.3 mg/dL   GFR, Estimated >60 >60 mL/min   Anion gap 7 5 - 15  CBC per protocol  Result Value Ref Range   WBC 7.4 4.0 - 10.5 K/uL   RBC 4.91 4.22 - 5.81 MIL/uL   Hemoglobin 14.6 13.0 - 17.0 g/dL   HCT 45.1 39.0 - 52.0 %   MCV 91.9 80.0 - 100.0 fL   MCH 29.7 26.0 - 34.0 pg   MCHC 32.4 30.0 - 36.0 g/dL   RDW 13.2 11.5 - 15.5 %   Platelets 256 150 - 400 K/uL   nRBC 0.0 0.0 - 0.2 %  Glucose, capillary  Result Value Ref Range   Glucose-Capillary 107 (H) 70 - 99 mg/dL  Glucose, capillary  Result Value Ref Range   Glucose-Capillary 114 (H) 70 - 99 mg/dL    Assessment & Plan:  This visit occurred during the SARS-CoV-2 public health emergency.  Safety protocols were in place, including screening questions prior to the visit, additional usage of staff PPE, and extensive cleaning of exam room while observing appropriate contact time as indicated for disinfecting solutions.   Problem List Items Addressed This Visit     Essential hypertension, benign    Chronic, stable on lisinopril 42m daily - home readings well controlled.       Controlled diabetes mellitus type 2 with complications (HCC) - Primary    Chronic, stable on current regimen.  Continue this.       Collapse of left lung    Appreciate pulm care - s/p video bronchoscopy with culture growing rare Prevotella bacteria s/p augmentin course. Pt overall feels well.         No orders of the defined types were placed in this encounter.  No orders of the defined types were placed in this encounter.    Patient Instructions  You are doing well today Continue  current medicines.  Return as needed or in 6 months for physical.   Follow up plan: Return in about 6 months (around 05/23/2022) for medicare wellness visit, follow up visit.  JRia Bush MD

## 2021-11-23 NOTE — Assessment & Plan Note (Signed)
Chronic, stable on current regimen.  Continue this.

## 2021-11-23 NOTE — Assessment & Plan Note (Signed)
Chronic, stable on lisinopril 10mg  daily - home readings well controlled.

## 2021-11-23 NOTE — Assessment & Plan Note (Signed)
Appreciate pulm care - s/p video bronchoscopy with culture growing rare Prevotella bacteria s/p augmentin course. Pt overall feels well.

## 2021-11-24 DIAGNOSIS — Z96651 Presence of right artificial knee joint: Secondary | ICD-10-CM | POA: Diagnosis not present

## 2021-12-03 LAB — FUNGUS CULTURE RESULT

## 2021-12-03 LAB — FUNGUS CULTURE WITH STAIN

## 2021-12-03 LAB — FUNGAL ORGANISM REFLEX

## 2021-12-04 ENCOUNTER — Other Ambulatory Visit: Payer: Self-pay

## 2021-12-04 ENCOUNTER — Ambulatory Visit (INDEPENDENT_AMBULATORY_CARE_PROVIDER_SITE_OTHER): Payer: Medicare Other | Admitting: Acute Care

## 2021-12-04 ENCOUNTER — Encounter: Payer: Self-pay | Admitting: Acute Care

## 2021-12-04 VITALS — BP 136/70 | HR 71 | Temp 98.4°F | Ht 64.0 in | Wt 228.4 lb

## 2021-12-04 DIAGNOSIS — J9811 Atelectasis: Secondary | ICD-10-CM | POA: Diagnosis not present

## 2021-12-04 DIAGNOSIS — R0600 Dyspnea, unspecified: Secondary | ICD-10-CM

## 2021-12-04 DIAGNOSIS — J069 Acute upper respiratory infection, unspecified: Secondary | ICD-10-CM | POA: Diagnosis not present

## 2021-12-04 DIAGNOSIS — R0609 Other forms of dyspnea: Secondary | ICD-10-CM | POA: Diagnosis not present

## 2021-12-04 DIAGNOSIS — Z9889 Other specified postprocedural states: Secondary | ICD-10-CM

## 2021-12-04 NOTE — Progress Notes (Signed)
History of Present Illness Roger Mcdaniel is a 71 y.o. male never smoker with PMH of essential hypertension, DM II, and collapsed L lung. Pt   underwent Flexible video fiberoptic bronchoscopy and biopsies on 11/05/2021 per Dr. Valeta Harms  for left base atelectasis/ collapse as an inpatient .    12/04/2021 Pt. Presents for follow up. He was admitted to the hospital  08/2021 for lung collapse after a fall 08/07/2021. He initially had no pain or issues. On 09/17/2021 he developed chest pain, and was evaluated in the ED. His  BP was elevated. EKG was fine, He was evaluated by cardiology. He was seen by pulmonary as an inpatient and at that time he underwent a Flexible video fiberoptic bronchoscopy and biopsies as he had atelectasis and there was concern for an endobronchial lesion . There was no finding of an obstructing mass, but cultures did come back as + for  a rare Prevotella bacteria. He was treated with Augmentin x 1 week. He has had no fever. No other signs or symptoms of infection. However he has noted he has significant dyspnea since December. He feels this is worse when it is cold.  He has noted a lot of drainage from his nose, but this is clear since he was treated with Augmentin.  Pt. States he does not generally have a cough.He has not had any pain in his legs or behind his knee. He has gained about 10 pounds of weight in the last month. We discussed that weight gain and deconditioning can impact breathing, and that he needs to work on losing some weight, and increasing his activity.  We will order PFT's to get a baseline of his pulmonary function. He is agreeable to a referral to healthy weight and wellness, and  PFT's. .   Test Results:  11/05/2021>> BAL LLL RARE Normal respiratory flora-no Staph aureus or Pseudomonas seen  RARE PREVOTELLA MELANINOGENICA  BETA LACTAMASE POSITIVE   10/06/2021 CT Chest IMPRESSION: Redemonstration of segmental collapse within the inferior left lower lobe and  within the lingula, similar to the previous chest radiography. I do not see an obstructing airway lesion. No sign of an underlying mass by CT.  CBC Latest Ref Rng & Units 11/05/2021 09/17/2021 11/28/2020  WBC 4.0 - 10.5 K/uL 7.4 7.5 12.1(H)  Hemoglobin 13.0 - 17.0 g/dL 14.6 15.0 11.2(L)  Hematocrit 39.0 - 52.0 % 45.1 45.5 33.3(L)  Platelets 150 - 400 K/uL 256 195 246    BMP Latest Ref Rng & Units 11/05/2021 09/17/2021 05/18/2021  Glucose 70 - 99 mg/dL 120(H) 114(H) 99  BUN 8 - 23 mg/dL _0 Creatinine 0.61 - 1.24 mg/dL 0.94 1.09 1.14  BUN/Creat Ratio 6 - 22 (calc) - - -  Sodium 135 - 145 mmol/L 138 138 137  Potassium 3.5 - 5.1 mmol/L 4.8 4.6 5.1  Chloride 98 - 111 mmol/L 102 104 101  CO2 22 - 32 mmol/L _1 Calcium 8.9 - 10.3 mg/dL 9.0 9.0 9.4    BNP No results found for: BNP  ProBNP No results found for: PROBNP  PFT No results found for: FEV1PRE, FEV1POST, FVCPRE, FVCPOST, TLC, DLCOUNC, PREFEV1FVCRT, PSTFEV1FVCRT  No results found.   Past medical hx Past Medical History:  Diagnosis Date   Bruises easily    Chest pain    CMC arthritis, thumb, degenerative    DDD (degenerative disc disease), cervical 2015   s/o ACDF ([place])   DM type 2 (diabetes mellitus, type 2) (  [place])    Dry skin    GERD 07/10/2008   Qualifier: Diagnosis of  By: Council Mechanic MD, Hilaria Ota    GERD (gastroesophageal reflux disease) 09/2003   History of ETT 05/23/2000   Nml (Dr. Acie Fredrickson)   History of kidney stones    HNP (herniated nucleus pulposus), cervical 12/20/2013   Hyperlipidemia 09/2003   Hypertension 06/2004   OSA (obstructive sleep apnea)    was on CPAP, not used  in 25-30 years      Social History   Tobacco Use   Smoking status: Never   Smokeless tobacco: Never  Vaping Use   Vaping Use: Never used  Substance Use Topics   Alcohol use: Not Currently    Alcohol/week: 1.0 standard drink    Types: 1 Standard drinks or equivalent per week   Drug use: No    Roger Mcdaniel reports  that he has never smoked. He has never used smokeless tobacco. He reports that he does not currently use alcohol after a past usage of about 1.0 standard drink per week. He reports that he does not use drugs.  Tobacco Cessation: Never smoker   Past surgical hx, Family hx, Social hx all reviewed.  Current Outpatient Medications on File Prior to Visit  Medication Sig   aspirin EC 81 MG tablet Take 1 tablet (81 mg total) by mouth daily. Swallow whole.   atorvastatin (LIPITOR) 40 MG tablet Take 1 tablet (40 mg total) by mouth daily.   blood glucose meter kit and supplies Fill One touch meter that is covered. Check sugar once daily. Dx E11.9   gabapentin (NEURONTIN) 300 MG capsule Take 300 mg by mouth 2 (two) times daily.   Lancets (ONETOUCH DELICA PLUS [id number]) MISC CHECK SUGAR ONCE DAILY DX E11.9   lisinopril (ZESTRIL) 10 MG tablet Take 1 tablet (10 mg total) by mouth daily.   Magnesium 250 MG TABS Take 250 mg by mouth daily.   metFORMIN (GLUCOPHAGE) 500 MG tablet Take 1 tablet (500 mg total) by mouth 2 (two) times daily with a meal.   nitroGLYCERIN (NITROSTAT) 0.4 MG SL tablet Place 1 tablet (0.4 mg total) under the tongue every 5 (five) minutes as needed for chest pain.   ONETOUCH ULTRA test strip CHECK SUGAR ONCE DAILY DX E111.9   vitamin C (ASCORBIC ACID) 500 MG tablet Take 1 tablet (500 mg total) by mouth 2 (two) times daily.   zinc gluconate 50 MG tablet Take 50 mg by mouth daily.   No current facility-administered medications on file prior to visit.     Allergies  Allergen Reactions   Codeine Sulfate Nausea Only   Pravachol [Pravastatin Sodium] Other (See Comments)    Muscle aches    Review Of Systems:  Constitutional:   No  weight loss, night sweats,  Fevers, chills, fatigue, or  lassitude.  HEENT:   No headaches,  Difficulty swallowing,  Tooth/dental problems, or  Sore throat,                No sneezing, itching, ear ache, nasal congestion, post nasal drip, + nasal  mucus  CV:  No chest pain,  Orthopnea, PND, swelling in lower extremities, anasarca, dizziness, palpitations, syncope.   GI  No heartburn, indigestion, abdominal pain, nausea, vomiting, diarrhea, change in bowel habits, loss of appetite, bloody stools.   Resp: + shortness of breath with exertion less at rest.  No excess mucus, no productive cough,  No non-productive cough,  No coughing up of blood.  No  change in color of mucus.  No wheezing.  No chest wall deformity  Skin: no rash or lesions.  GU: no dysuria, change in color of urine, no urgency or frequency.  No flank pain, no hematuria   MS:  No joint pain or swelling.  No decreased range of motion.  No back pain.  Psych:  No change in mood or affect. No depression or anxiety.  No memory loss.   Vital Signs BP 136/70 (BP Location: Left Arm, Patient Position: Sitting, Cuff Size: Normal)    Pulse 71    Temp 98.4 F (36.9 C) (Oral)    Ht _0  (1.626 m)    Wt 228 lb 6.4 oz (103.6 kg)    SpO2 98%    BMI 39.20 kg/m    Physical Exam:  General- No distress,  A&Ox3, MAE x 4 ENT: No sinus tenderness, TM clear, pale nasal mucosa, no oral exudate,no post nasal drip, no LAN Cardiac: S1, S2, regular rate and rhythm, no murmur Chest: No wheeze/ rales/ dullness; no accessory muscle use, no nasal flaring, no sternal retractions. + diminished  BS L base Abd.: Soft Non-tender, ND, BS +, Body mass index is 39.2 kg/m.  Ext: No clubbing cyanosis, edema Neuro:  normal strength, MAE x 4, A&O x 3 Skin: No rashes, warm and dry, No lesions  Psych: normal mood and behavior     Assessment/Plan L Lobe atelectasis 2/2 fall 07/2021 Continued dyspnea >> suspect partially 2/2 weight gain of 10 lbs in the last month, and deconditioning Post bronchoscopy 11/05/2021, Negative for malignancy, + for  a rare Prevotella bacteria that is BETA LACTAMASE POSITIVE >> treated with Augmentin Plan We will order a CT Chest to re-evaluate the left lung. You will get a  call to get this scheduled. We will order PFT's to better evaluate you Pulmonary function. You will get a call to schedule this.  Please use your Incentive Spirometer at least 2 x daily. Once you have mastered one level, increase the goal.  We will refer [facility] to [facility] . They will call you , they are backed up, but they will get you in.  Follow up with Dr. Ander Slade after PFT's and CT Chest. ( Scheduled for 01/11/2022) Please contact office for sooner follow up if symptoms do not improve or worsen or seek emergency care   I spent 40 minutes dedicated to the care of this patient on the date of this encounter to include pre-visit review of records, face-to-face time with the patient discussing conditions above, post visit ordering of testing, clinical documentation with the electronic health record, making appropriate referrals as documented, and communicating necessary information to the patient's healthcare team.       Magdalen Spatz, NP 12/04/2021  8:57 AM

## 2021-12-04 NOTE — Patient Instructions (Addendum)
We will order a CT Chest to re-evaluate the left lung. ?You will get a call to get this scheduled. ?We will order PFT's to better evaluate you Pulmonary function. ?You will get a call to schedule this.  ?Please use your Incentive Spirometer at least 2 x daily. ?Once you have mastered one level, increase the goal.  ?We will refer you to [facility] . ?They will call you , they are backed up, but they will get you in.  ?Follow up with Dr. Ander Slade after PFT's and CT Chest. ( Scheduled for 01/11/2022) ?Consider follow up sputum cultures to determine resolution ?Please contact office for sooner follow up if symptoms do not improve or worsen or seek emergency care   ?

## 2021-12-09 NOTE — Telephone Encounter (Signed)
Called and spoke with pt about upcoming appts. Rescheduled pt's OV with AO to 1:30 so that way we could have him have the PFT at 12pm on 4/17. Nothing further needed. ?

## 2021-12-13 ENCOUNTER — Other Ambulatory Visit: Payer: Self-pay | Admitting: Family Medicine

## 2021-12-13 DIAGNOSIS — E118 Type 2 diabetes mellitus with unspecified complications: Secondary | ICD-10-CM

## 2021-12-17 ENCOUNTER — Other Ambulatory Visit: Payer: Self-pay

## 2021-12-17 ENCOUNTER — Ambulatory Visit
Admission: RE | Admit: 2021-12-17 | Discharge: 2021-12-17 | Disposition: A | Discharge status: Home / Self Care | Payer: Medicare Other | Source: Ambulatory Visit | Attending: Acute Care | Admitting: Acute Care

## 2021-12-17 DIAGNOSIS — R0609 Other forms of dyspnea: Secondary | ICD-10-CM | POA: Insufficient documentation

## 2021-12-17 DIAGNOSIS — J9811 Atelectasis: Secondary | ICD-10-CM | POA: Diagnosis not present

## 2021-12-18 LAB — ACID FAST CULTURE WITH REFLEXED SENSITIVITIES (MYCOBACTERIA): Acid Fast Culture: NEGATIVE

## 2021-12-26 ENCOUNTER — Encounter: Payer: Self-pay | Admitting: Family Medicine

## 2021-12-26 DIAGNOSIS — I2510 Atherosclerotic heart disease of native coronary artery without angina pectoris: Secondary | ICD-10-CM

## 2021-12-26 DIAGNOSIS — Z0289 Encounter for other administrative examinations: Secondary | ICD-10-CM

## 2021-12-31 DIAGNOSIS — I2510 Atherosclerotic heart disease of native coronary artery without angina pectoris: Secondary | ICD-10-CM | POA: Insufficient documentation

## 2022-01-04 ENCOUNTER — Other Ambulatory Visit: Payer: Self-pay | Admitting: Family Medicine

## 2022-01-05 ENCOUNTER — Encounter (INDEPENDENT_AMBULATORY_CARE_PROVIDER_SITE_OTHER): Payer: Self-pay | Admitting: Bariatrics

## 2022-01-05 ENCOUNTER — Ambulatory Visit (INDEPENDENT_AMBULATORY_CARE_PROVIDER_SITE_OTHER): Payer: Medicare Other | Admitting: Bariatrics

## 2022-01-05 VITALS — BP 142/81 | HR 66 | Temp 98.2°F | Ht 64.0 in | Wt 221.0 lb

## 2022-01-05 DIAGNOSIS — R5383 Other fatigue: Secondary | ICD-10-CM

## 2022-01-05 DIAGNOSIS — Z1331 Encounter for screening for depression: Secondary | ICD-10-CM

## 2022-01-05 DIAGNOSIS — E1169 Type 2 diabetes mellitus with other specified complication: Secondary | ICD-10-CM

## 2022-01-05 DIAGNOSIS — E669 Obesity, unspecified: Secondary | ICD-10-CM

## 2022-01-05 DIAGNOSIS — E668 Other obesity: Secondary | ICD-10-CM | POA: Diagnosis not present

## 2022-01-05 DIAGNOSIS — Z7984 Long term (current) use of oral hypoglycemic drugs: Secondary | ICD-10-CM

## 2022-01-05 DIAGNOSIS — G4739 Other sleep apnea: Secondary | ICD-10-CM | POA: Diagnosis not present

## 2022-01-05 DIAGNOSIS — R0602 Shortness of breath: Secondary | ICD-10-CM

## 2022-01-05 DIAGNOSIS — I10 Essential (primary) hypertension: Secondary | ICD-10-CM | POA: Diagnosis not present

## 2022-01-05 DIAGNOSIS — E559 Vitamin D deficiency, unspecified: Secondary | ICD-10-CM | POA: Diagnosis not present

## 2022-01-05 DIAGNOSIS — Z6838 Body mass index (BMI) 38.0-38.9, adult: Secondary | ICD-10-CM | POA: Diagnosis not present

## 2022-01-05 DIAGNOSIS — E785 Hyperlipidemia, unspecified: Secondary | ICD-10-CM | POA: Diagnosis not present

## 2022-01-06 ENCOUNTER — Encounter (INDEPENDENT_AMBULATORY_CARE_PROVIDER_SITE_OTHER): Payer: Self-pay | Admitting: Bariatrics

## 2022-01-06 DIAGNOSIS — E559 Vitamin D deficiency, unspecified: Secondary | ICD-10-CM | POA: Insufficient documentation

## 2022-01-06 LAB — LIPID PANEL WITH LDL/HDL RATIO
Cholesterol, Total: 107 mg/dL (ref 100–199)
HDL: 45 mg/dL (ref >39)
LDL Chol Calc (NIH): 48 mg/dL (ref 0–99)
LDL/HDL Ratio: 1.1 ratio (ref 0.0–3.6)
Triglycerides: 63 mg/dL (ref 0–149)
VLDL Cholesterol Cal: 14 mg/dL (ref 5–40)

## 2022-01-06 LAB — COMPREHENSIVE METABOLIC PANEL
ALT: 16 IU/L (ref 0–44)
AST: 22 IU/L (ref 0–40)
Albumin/Globulin Ratio: 2.4 — ABNORMAL HIGH (ref 1.2–2.2)
Albumin: 4.5 g/dL (ref 3.8–4.8)
Alkaline Phosphatase: 98 IU/L (ref 44–121)
BUN/Creatinine Ratio: 15 (ref 10–24)
BUN: 15 mg/dL (ref 8–27)
Bilirubin Total: 0.8 mg/dL (ref 0.0–1.2)
CO2: 27 mmol/L (ref 20–29)
Calcium: 9.2 mg/dL (ref 8.6–10.2)
Chloride: 99 mmol/L (ref 96–106)
Creatinine, Ser: 0.98 mg/dL (ref 0.76–1.27)
Globulin, Total: 1.9 g/dL (ref 1.5–4.5)
Glucose: 97 mg/dL (ref 70–99)
Potassium: 5.4 mmol/L — ABNORMAL HIGH (ref 3.5–5.2)
Sodium: 141 mmol/L (ref 134–144)
Total Protein: 6.4 g/dL (ref 6.0–8.5)
eGFR: 83 mL/min/{1.73_m2} (ref >59)

## 2022-01-06 LAB — HEMOGLOBIN A1C
Est. average glucose Bld gHb Est-mCnc: 137 mg/dL
Hgb A1c MFr Bld: 6.4 % — ABNORMAL HIGH (ref 4.8–5.6)

## 2022-01-06 LAB — VITAMIN D 25 HYDROXY (VIT D DEFICIENCY, FRACTURES): Vit D, 25-Hydroxy: 18 ng/mL — ABNORMAL LOW (ref 30.0–100.0)

## 2022-01-06 LAB — TSH+T4F+T3FREE
Free T4: 1.28 ng/dL (ref 0.82–1.77)
T3, Free: 3.1 pg/mL (ref 2.0–4.4)
TSH: 2.2 u[IU]/mL (ref 0.450–4.500)

## 2022-01-06 LAB — INSULIN, RANDOM: INSULIN: 10.1 u[IU]/mL (ref 2.6–24.9)

## 2022-01-06 NOTE — Progress Notes (Signed)
? ? ? ?Chief Complaint:  ? ?OBESITY ?Roger Mcdaniel (MR# [id number]) is a 71 y.o. male who presents for evaluation and treatment of obesity and related comorbidities. Current BMI is Body mass index is 37.93 kg/m?[name] Roger Mcdaniel has been struggling with his weight for many years and has been unsuccessful in either losing weight, maintaining weight loss, or reaching his healthy weight goal. ? ?Roger Mcdaniel sometimes like to cook, but notes will power and other issues. He craves sweets and quick food.  ? ?Roger Mcdaniel is currently in the action stage of change and ready to dedicate time achieving and maintaining a healthier weight. Roger Mcdaniel is interested in becoming our patient and working on intensive lifestyle modifications including (but not limited to) diet and exercise for weight loss. ? ?Roger Mcdaniel's habits were reviewed today and are as follows: his desired weight loss is 46 pounds, he has been heavy most of his life, he started gaining weight 25 years ago, his heaviest weight ever was 300 pounds, he has significant food cravings issues, he snacks frequently in the evenings, he skips meals frequently, he is frequently drinking liquids with calories, he frequently makes poor food choices, he has problems with excessive hunger, he frequently eats larger portions than normal, he has binge eating behaviors, and he struggles with emotional eating. ? ?Depression Screen ?Roger Mcdaniel's Food and Mood (modified PHQ-9) score was 12. ? ? ?  01/05/2022  ?  6:47 AM  ?Depression screen PHQ 2/9  ?Decreased Interest 1  ?Down, Depressed, Hopeless 1  ?PHQ - 2 Score 2  ?Altered sleeping 2  ?Tired, decreased energy 3  ?Change in appetite 2  ?Feeling bad or failure about yourself  1  ?Trouble concentrating 0  ?Moving slowly or fidgety/restless 2  ?Suicidal thoughts 0  ?PHQ-9 Score 12  ?Difficult doing work/chores Somewhat difficult  ? ?Subjective:  ? ?1. Other fatigue ?Roger Mcdaniel will continue activities. Roger Mcdaniel admits to daytime somnolence and admits to waking up still tired.  Patient has a history of symptoms of morning fatigue. Roger Mcdaniel generally gets  6.5  hours of sleep per night, and states that he has difficulty falling back asleep if awakened. Snoring are not present. Apneic episodes are not present. Epworth Sleepiness Score is 16.   ? ?2. SOB (shortness of breath) on exertion ?Roger Mcdaniel notes increasing shortness of breath with exercising and seems to be worsening over time with weight gain. He notes getting out of breath sooner with activity than he used to. This has not gotten worse recently. Roger Mcdaniel denies shortness of breath at rest or orthopnea.  ? ?3. Essential hypertension, benign ?Roger Mcdaniel is currently taking Lisinopril. His blood pressure is well controlled. ? ?4. Other sleep apnea ?Roger Mcdaniel currently does not use a CPAP. He was diagnosed when increased weight and states and was told to stop.  ? ?5. Hyperlipidemia associated with type 2 diabetes mellitus (Roger Mcdaniel) ?Roger Mcdaniel is taking atorvastatin currently.  ? ?6. Diabetes mellitus type 2 in obese [name]) ?Roger Mcdaniel is currently taking Metformin.  ? ?7. Vitamin D deficiency ?Roger Mcdaniel is not taking Vitamin D currently.  ? ?Assessment/Plan:  ? ?1. Other fatigue ?Roger Mcdaniel does feel that his weight is causing his energy to be lower than it should be. Fatigue may be related to obesity, depression or many other causes. Labs will be ordered, and in the meanwhile, Roger Mcdaniel will focus on self care including making healthy food choices, increasing physical activity and focusing on stress reduction. Roger Mcdaniel will gradually increase activities. We will check thyroid and review EKG today.  ? ?-  EKG 12-Lead ?- TSH+T4F+T3Free ? ?2. SOB (shortness of breath) on exertion ?Roger Mcdaniel does feel that he gets out of breath more easily that he used to when he exercises. Roger Mcdaniel's shortness of breath appears to be obesity related and exercise induced. He has agreed to work on weight loss and gradually increase exercise to treat his exercise induced shortness of breath. Will continue to  monitor closely.  ?- TSH+T4F+T3Free ? ?3. Essential hypertension, benign ?Roger Mcdaniel will continue taking Lisinopril. We will check CMP today. Roger Mcdaniel is working on healthy weight loss and exercise to improve blood pressure control. We will watch for signs of hypotension as he continues his lifestyle modifications. ? ?- Comprehensive metabolic panel ? ?4. Other sleep apnea ?We discussed the importance of restarting sleep.  Intensive lifestyle modifications are the first line treatment for this issue. We discussed several lifestyle modifications today and he will continue to work on diet, exercise and weight loss efforts. We will continue to monitor. Orders and follow up as documented in patient record.  ? ?5. Hyperlipidemia associated with type 2 diabetes mellitus (Roger Mcdaniel) ?Cardiovascular risk and specific lipid/LDL goals reviewed.  We will check lipids and CMP today. We discussed several lifestyle modifications today and Roger Mcdaniel will continue to work on diet, exercise and weight loss efforts. Orders and follow up as documented in patient record.  ? ?Counseling ?Intensive lifestyle modifications are the first line treatment for this issue. ?Dietary changes: Increase soluble fiber. Decrease simple carbohydrates. ?Exercise changes: Moderate to vigorous-intensity aerobic activity 150 minutes per week if tolerated. ?Lipid-lowering medications: see documented in medical record. ? ?- Lipid Panel With LDL/HDL Ratio ?- Comprehensive metabolic panel ? ?6. Diabetes mellitus type 2 in obese [name]) ?We will check A1C and insulin today. Good blood sugar control is important to decrease the likelihood of diabetic complications such as nephropathy, neuropathy, limb loss, blindness, coronary artery disease, and death. Intensive lifestyle modification including diet, exercise and weight loss are the first line of treatment for diabetes.  ? ?- Insulin, random ?- Hemoglobin A1c ? ?7. Vitamin D deficiency ?Low Vitamin D level contributes to fatigue  and are associated with obesity, breast, and colon cancer. We will check Vitamin D today and she will follow-up for routine testing of Vitamin D, at least 2-3 times per year to avoid over-replacement. ? ?- VITAMIN D 25 Hydroxy (Vit-D Deficiency, Fractures) ? ?8. Depression screen ?Hayyan had a positive depression screening. Depression is commonly associated with obesity and often results in emotional eating behaviors. We will monitor this closely and work on CBT to help improve the non-hunger eating patterns. Referral to Psychology may be required if no improvement is seen as he continues in our clinic.  ? ?9. Class 2 severe obesity with serious comorbidity and body mass index (BMI) of 38.0 to 38.9 in adult, Roger Mcdaniel obesity type ([place]) ?Demetre is currently in the action stage of change and his goal is to continue with weight loss efforts. I recommend Yoriel begin the structured treatment plan as follows: ? ?He has agreed to the Category 3 Plan. ? ?Ishaq will continue meal planning. We reviewed labs from 11/05/2021 CBC, glucose, and BMP.  ? ?Exercise goals:  Wylan will start riding a stationary bike at the [facility].   ? ?Behavioral modification strategies: increasing lean protein intake, decreasing simple carbohydrates, increasing vegetables, increasing water intake, decreasing eating out, no skipping meals, meal planning and cooking strategies, keeping healthy foods in the home, and planning for success. ? ?He was informed of the importance of  frequent follow-up visits to maximize his success with intensive lifestyle modifications for his multiple health conditions. He was informed we would discuss his lab results at his next visit unless there is a critical issue that needs to be addressed sooner. Takeem agreed to keep his next visit at the agreed upon time to discuss these results. ? ?Objective:  ? ?Blood pressure (!) 142/81, pulse 66, temperature 98.2 ?F (36.8 ?C), height 5\' 4"  (1.626 m), weight 221 lb (100.2 kg), SpO2  98 %. Body mass index is 37.93 kg/m?. ? ?EKG: Normal sinus rhythm, rate 66 bpm. ? ?Indirect Calorimeter completed today shows a VO2 of 306 and a REE of 2117.  His calculated basal metabolic rate is 123456

## 2022-01-07 ENCOUNTER — Encounter (INDEPENDENT_AMBULATORY_CARE_PROVIDER_SITE_OTHER): Payer: Self-pay | Admitting: Bariatrics

## 2022-01-11 ENCOUNTER — Ambulatory Visit (INDEPENDENT_AMBULATORY_CARE_PROVIDER_SITE_OTHER): Payer: Medicare Other | Admitting: Pulmonary Disease

## 2022-01-11 ENCOUNTER — Encounter: Payer: Self-pay | Admitting: Pulmonary Disease

## 2022-01-11 VITALS — BP 110/76 | HR 70 | Temp 97.9°F | Ht 64.0 in | Wt 218.6 lb

## 2022-01-11 DIAGNOSIS — R0609 Other forms of dyspnea: Secondary | ICD-10-CM

## 2022-01-11 DIAGNOSIS — R0600 Dyspnea, unspecified: Secondary | ICD-10-CM

## 2022-01-11 LAB — PULMONARY FUNCTION TEST
DL/VA % pred: 122 %
DL/VA: 5.09 ml/min/mmHg/L
DLCO cor % pred: 109 %
DLCO cor: 23.58 ml/min/mmHg
DLCO unc % pred: 109 %
DLCO unc: 23.58 ml/min/mmHg
FEF 25-75 Post: 2.29 L/sec
FEF 25-75 Pre: 1.36 L/sec
FEF2575-%Change-Post: 68 %
FEF2575-%Pred-Post: 115 %
FEF2575-%Pred-Pre: 68 %
FEV1-%Change-Post: 14 %
FEV1-%Pred-Post: 72 %
FEV1-%Pred-Pre: 63 %
FEV1-Post: 1.86 L
FEV1-Pre: 1.62 L
FEV1FVC-%Change-Post: 0 %
FEV1FVC-%Pred-Pre: 104 %
FEV6-%Change-Post: 15 %
FEV6-%Pred-Post: 74 %
FEV6-%Pred-Pre: 64 %
FEV6-Post: 2.43 L
FEV6-Pre: 2.1 L
FEV6FVC-%Change-Post: 0 %
FEV6FVC-%Pred-Post: 106 %
FEV6FVC-%Pred-Pre: 105 %
FVC-%Change-Post: 14 %
FVC-%Pred-Post: 69 %
FVC-%Pred-Pre: 60 %
FVC-Post: 2.43 L
FVC-Pre: 2.12 L
Post FEV1/FVC ratio: 76 %
Post FEV6/FVC ratio: 100 %
Pre FEV1/FVC ratio: 77 %
Pre FEV6/FVC Ratio: 99 %
RV % pred: 122 %
RV: 2.57 L
TLC % pred: 83 %
TLC: 4.88 L

## 2022-01-11 NOTE — Patient Instructions (Signed)
CT chest stable from previous ?-Atelectasis left lower lobe ?-Visual evaluation did not show significant abnormality ?-CT scan was stable compared to 1 performed in January ? ?Breathing study with significant bronchodilator response ?-Albuterol did open up your airways a little bit ? ?No changes to management ? ?If you start having symptoms of shortness of breath-rescue inhaler may help ? ?I will see you in 6 months ? ?Chest x-ray to be done on the day of next visit ? ?Call us with significant concerns ?

## 2022-01-11 NOTE — Progress Notes (Signed)
? ?      ?Roger Mcdaniel    [id number]    10-03-1950 ? ?Primary Care Physician:Gutierrez, Garlon Hatchet, MD ? ?Referring Physician: Ria Bush, MD ?[address] ?[place],  [place] [id number] ? ?Chief complaint:   ?In for follow-up for abnormal CT showing atelectasis left lower lobe ? ?HPI: ? ?Recent CT shows atelectasis left lower lobe ? ?Did have a bronchoscopy that did not show any endobronchial lesions ?Repeat CT shows stable findings ? ?He denies any significant symptoms at the present time, occasionally still has a twinge of discomfort on the left side of his chest ? ?He had had a chest injury in November fell and injured his chest left him with a bruise.  Did not have any discomfort apart from just bruising on his chest.  A few weeks later did have some chest pain and he went to the hospital to get evaluated.  He had a CT scan of his chest showing atelectasis at the base. ? ?Never smoker ?Parents did smoke ?Has had some shortness of breath recently which I think may be related to deconditioning ? ?Has slowed down since retiring ? 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 ?He has no cough, some shortness of breath with activity ? ?Currently denies any pain or discomfort. ? ?He has hypertension, diabetes, hypercholesterolemia ? ?Outpatient Encounter Medications as of 01/11/2022  ?Medication Sig  ? aspirin EC 81 MG tablet Take 1 tablet (81 mg total) by mouth daily. Swallow whole.  ? atorvastatin (LIPITOR) 40 MG tablet Take 1 tablet (40 mg total) by mouth daily.  ? gabapentin (NEURONTIN) 300 MG capsule Take 300 mg by mouth 2 (two) times daily.  ? Lancets (ONETOUCH DELICA PLUS [id number]) MISC CHECK SUGAR ONCE DAILY DX E11.9  ? lisinopril (ZESTRIL) 10 MG tablet Take 1 tablet (10 mg total) by mouth daily.  ? Magnesium 250 MG TABS Take 250 mg by mouth daily.  ? metFORMIN (GLUCOPHAGE) 500 MG tablet Take 1 tablet (500 mg total) by mouth 2 (two) times daily with a meal.  ? ONETOUCH ULTRA  test strip CHECK BLOOD SUGAR EVERY DAY  ? vitamin C (ASCORBIC ACID) 500 MG tablet Take 1 tablet (500 mg total) by mouth 2 (two) times daily.  ? zinc gluconate 50 MG tablet Take 50 mg by mouth daily.  ? [DISCONTINUED] blood glucose meter kit and supplies Fill One touch meter that is covered. Check sugar once daily. Dx E11.9 (Patient not taking: Reported on 01/05/2022)  ? [DISCONTINUED] nitroGLYCERIN (NITROSTAT) 0.4 MG SL tablet Place 1 tablet (0.4 mg total) under the tongue every 5 (five) minutes as needed for chest pain. (Patient not taking: Reported on 01/05/2022)  ? ?No facility-administered encounter medications on file as of 01/11/2022.  ? ? ?Allergies as of 01/11/2022 - Review Complete 01/11/2022  ?Allergen Reaction Noted  ? Codeine sulfate Nausea Only 07/10/2008  ? Pravachol [pravastatin sodium] Other (See Comments) 07/19/2012  ? ? ?Past Medical History:  ?Diagnosis Date  ? Back pain   ? Bruises easily   ? Chest pain   ? CMC arthritis, thumb, degenerative   ? Collapse of left lung   ? DDD (degenerative disc disease), cervical 2015  ? s/o ACDF [facility])  ? DM type 2 (diabetes mellitus, type 2) ([place])   ? Dry skin   ? Edema, lower extremity   ? GERD 07/10/2008  ? Qualifier: Diagnosis of  By: Council Mechanic MD, Hilaria Ota   ? GERD (  gastroesophageal reflux disease) 09/2003  ? History of ETT 05/23/2000  ? Nml (Dr. Acie Fredrickson)  ? History of kidney stones   ? HNP (herniated nucleus pulposus), cervical 12/20/2013  ? Hyperlipidemia 09/2003  ? Hypertension 06/2004  ? Joint pain   ? Obesity   ? OSA (obstructive sleep apnea)   ? was on CPAP, not used  in 25-30 years   ? Pre-diabetes   ? SOB (shortness of breath)   ? Swallowing difficulty   ? ? ?Past Surgical History:  ?Procedure Laterality Date  ? ANTERIOR CERVICAL DECOMP/DISCECTOMY FUSION N/A 01/14/2014  ? Procedure: ANTERIOR CERVICAL DECOMPRESSION/DISCECTOMY FUSION CERVICAL FOUR-FIVE,CERVICAL FIVE-SIX ;  Surgeon: Hosie Spangle, MD  ? BRONCHIAL WASHINGS Left 11/05/2021  ?  Procedure: BRONCHIAL WASHINGS;  Surgeon: Garner Nash, DO;  Location: [facility];  Service: Pulmonary;  Laterality: Left;  ? CATARACT EXTRACTION Right 02/28/2019  ? Per patient  ? CATARACT EXTRACTION Left 03/12/2019  ? Per patient  ? CYSTOSCOPY/URETEROSCOPY/HOLMIUM LASER/STENT PLACEMENT Right 05/28/2020  ? Procedure: CYSTOSCOPY RIGHT URETEROSCOPY/HOLMIUM LASER/STENT PLACEMENT STONE BASKET RETRACTION;  Surgeon: Lucas Mallow, MD;  [facility]: [facility];  Service: Urology;  Laterality: Right;  ? EXTRACORPOREAL SHOCK WAVE LITHOTRIPSY Right 03/17/2020  ? Procedure: RIGHT EXTRACORPOREAL SHOCK WAVE LITHOTRIPSY (ESWL);  Surgeon: Ceasar Mons, MD;  [facility];  Service: Urology;  Laterality: Right;  ? FLEXIBLE BRONCHOSCOPY  10/2021  ? Flexible video fiberoptic bronchoscopy and biopsies for partially collapsed L lung ([facility])  ? KNEE ARTHROPLASTY Right 11/27/2020  ? Procedure: COMPUTER ASSISTED TOTAL KNEE ARTHROPLASTY;  Surgeon: Rod Can, MD;  Location: [facility];  Service: Orthopedics;  Laterality: Right;  ? ORIF ANKLE FRACTURE  1980  ? right  ? RETINAL LASER PROCEDURE Right 03/14/2019  ? Per patient  ? SEPTOPLASTY  2005  ? for sleep apnea //cpap  ? SHOULDER SURGERY Right 06/2017  ? Dr Onnie Graham [facility]  ? VIDEO BRONCHOSCOPY Bilateral 11/05/2021  ? Procedure: VIDEO BRONCHOSCOPY WITHOUT FLUORO;  Surgeon: Garner Nash, DO;  Location: [place];  Service: Pulmonary;  Laterality: Bilateral;  Cytology not needed  ? WRIST ARTHROSCOPY WITH CARPOMETACARPEL [facility]) ARTHROPLASTY Right 11/2018  ? R CMC and R MCP arthroplasty ([place])  ? ? ?Family History  ?Problem Relation Age of Onset  ? CVA Mother   ? Cancer Mother   ? Heart disease Father   ?     MI x 2  ? Hypertension Brother   ? Obesity Brother   ? Heart disease Paternal Grandfather   ?      MI  ? Cancer Other   ? ? ?Social History  ? ?Socioeconomic History  ? Marital status: Widowed  ?  Spouse name: Not on  file  ? Number of children: Not on file  ? Years of education: Not on file  ? Highest education level: Not on file  ?Occupational History  ? Occupation: Data processing manager  ?  Comment: Primary school teacher  ? Occupation: semi retired  ?Tobacco Use  ? Smoking status: Never  ? Smokeless tobacco: Never  ?Vaping Use  ? Vaping Use: Never used  ?Substance and Sexual Activity  ? Alcohol use: Not Currently  ?  Alcohol/week: 1.0 standard drink  ?  Types: 1 Standard drinks or equivalent per week  ? Drug use: No  ? Sexual activity: Yes  ?Other Topics Concern  ? Not on file  ?Social History Narrative  ? Married. Widower - wife passed after leg surgery  11/2014  ? Children: 2 out of house  ? Occ: retired, was Software engineer of [employer]  ? Activity: no regular exercise  ? ?Social Determinants of Health  ? ?Financial Resource Strain: Not on file  ?Food Insecurity: Not on file  ?Transportation Needs: Not on file  ?Physical Activity: Not on file  ?Stress: Not on file  ?Social Connections: Not on file  ?Intimate Partner Violence: Not on file  ? ? ?Review of Systems  ?Constitutional:  Negative for fatigue.  ?Respiratory:  Negative for cough and shortness of breath.   ?Psychiatric/Behavioral:  Positive for sleep disturbance.   ? ?Vitals:  ? 01/11/22 1336  ?BP: 110/76  ?Pulse: 70  ?Temp: 97.9 ?F (36.6 ?C)  ?SpO2: 95%  ? ? ? ?Physical Exam ?Constitutional:   ?   Appearance: He is obese.  ?HENT:  ?   Head: Normocephalic.  ?   Mouth/Throat:  ?   Mouth: Mucous membranes are moist.  ?Eyes:  ?   Pupils: Pupils are equal, round, and reactive to light.  ?Cardiovascular:  ?   Rate and Rhythm: Normal rate and regular rhythm.  ?   Heart sounds: No murmur heard. ?  No friction rub.  ?Pulmonary:  ?   Effort: No respiratory distress.  ?   Breath sounds: No stridor. No wheezing or rhonchi.  ?Musculoskeletal:  ?   Cervical back: No rigidity or tenderness.  ?Neurological:  ?   Mental Status: He is alert.  ?Psychiatric:     ?   Mood and Affect: Mood  normal.  ? ? ?Data Reviewed: ?CT scan 10/06/2021 was reviewed with the patient showing atelectasis left base, and had a CT scan of his abdomen 03/07/2020 that she did not reveal atelectasis at the lung base ? ?Mos

## 2022-01-11 NOTE — Progress Notes (Signed)
Full PFT completed today ? ?

## 2022-01-19 ENCOUNTER — Ambulatory Visit (INDEPENDENT_AMBULATORY_CARE_PROVIDER_SITE_OTHER): Payer: Medicare Other | Admitting: Bariatrics

## 2022-01-19 ENCOUNTER — Encounter (INDEPENDENT_AMBULATORY_CARE_PROVIDER_SITE_OTHER): Payer: Self-pay | Admitting: Bariatrics

## 2022-01-19 VITALS — BP 121/76 | HR 64 | Temp 97.9°F | Ht 64.0 in | Wt 212.0 lb

## 2022-01-19 DIAGNOSIS — Z6836 Body mass index (BMI) 36.0-36.9, adult: Secondary | ICD-10-CM

## 2022-01-19 DIAGNOSIS — Z7984 Long term (current) use of oral hypoglycemic drugs: Secondary | ICD-10-CM

## 2022-01-19 DIAGNOSIS — E1169 Type 2 diabetes mellitus with other specified complication: Secondary | ICD-10-CM

## 2022-01-19 DIAGNOSIS — E559 Vitamin D deficiency, unspecified: Secondary | ICD-10-CM

## 2022-01-19 DIAGNOSIS — E669 Obesity, unspecified: Secondary | ICD-10-CM

## 2022-01-19 MED ORDER — VITAMIN D (ERGOCALCIFEROL) 1.25 MG (50000 UNIT) PO CAPS: 50000.0000 [IU] | ORAL_CAPSULE | ORAL | 0 refills | Status: DC

## 2022-01-28 NOTE — Progress Notes (Signed)
? ? ? ?Chief Complaint:  ? ?OBESITY ?Roger Mcdaniel here to discuss his progress with his obesity treatment plan along with follow-up of his obesity related diagnoses. Roger Mcdaniel Mcdaniel on the Category 3 Plan and states he Mcdaniel following his eating plan approximately 100% of the time. Roger Mcdaniel states he Mcdaniel walking 20 minutes 2-3 times per week. ? ?Today's visit was #: 2 ?Starting weight: 221 lbs ?Starting date: 01/05/2022 ?Today's weight: 212 lbs ?Today's date: 01/05/2022 ?Total lbs lost to date: 9 lbs ?Total lbs lost since last in-office visit: 9 lbs ? ?Interim History: Roger Mcdaniel Mcdaniel down 9 lbs since his first visit. He did not struggle, but found it hard to eat everything. ? ?Subjective:  ? ?1. Vitamin D deficiency ?Roger Mcdaniel's Vitamin D level Mcdaniel 18.0 and Mcdaniel not currently taking Vitamin D. ? ?2. Diabetes mellitus type 2 in obese Roger Mcdaniel) ?Roger Mcdaniel Mcdaniel currently taking Metformin 500 mg twice daily. ? ?Assessment/Plan:  ? ?1. Vitamin D deficiency ?Low Vitamin D level contributes to fatigue and are associated with obesity, breast, and colon cancer. He agrees to continue to take prescription Vitamin D @50 ,000 IU every week and will follow-up for routine testing of Vitamin D, at least 2-3 times per year to avoid over-replacement. ? ?- Vitamin D, Ergocalciferol, (DRISDOL) 1.25 MG (50000 UNIT) CAPS capsule; Take 1 capsule (50,000 Units total) by mouth every 7 (seven) days.  Dispense: 5 capsule; Refill: 0 ? ?2. Diabetes mellitus type 2 in obese [name] ?Roger Mcdaniel will keep his carbohydrates low, both sweets & starches. ? ?3. Obesity, Current BMI 36.4 ?Roger Mcdaniel Mcdaniel currently in the action stage of change. Roger such, his goal Mcdaniel to continue with weight loss efforts. He Mcdaniel Mcdaniel to the Category 3 Plan.  ? ?Roger Mcdaniel to meal planning and intentional eating. He will keep his water and protein high. ?Roger Mcdaniel. ? ?Exercise goals: Roger Mcdaniel. ? ?Behavioral modification strategies: increasing lean protein intake, decreasing simple carbohydrates, increasing  vegetables, increasing water intake, decreasing eating out, no skipping meals, meal planning and cooking strategies, keeping healthy foods in the home, and planning for success. ? ?Roger Mcdaniel. He was informed of the importance of frequent follow-up visits to maximize his success with intensive lifestyle modifications for his multiple health conditions.  ? ?Objective:  ? ?Blood pressure 121/76, pulse 64, temperature 97.9 ?F (36.6 ?C), height 5\' 4"  (1.626 m), SpO2 98 %. ?Body mass index Mcdaniel 37.52 kg/m?. ? ?General: Cooperative, alert, well developed, in no acute distress. ?HEENT: Conjunctivae and lids unremarkable. ?Cardiovascular: Regular rhythm.  ?Lungs: Normal work of breathing. ?Neurologic: No focal deficits.  ? ?Lab Results  ?Component Value Date  ? CREATININE 0.98 01/05/2022  ? BUN 15 01/05/2022  ? NA 141 01/05/2022  ? K 5.4 (H) 01/05/2022  ? CL 99 01/05/2022  ? CO2 27 01/05/2022  ? ?Lab Results  ?Component Value Date  ? ALT 16 01/05/2022  ? AST 22 01/05/2022  ? ALKPHOS 98 01/05/2022  ? BILITOT 0.8 01/05/2022  ? ?Lab Results  ?Component Value Date  ? HGBA1C 6.4 (H) 01/05/2022  ? HGBA1C 6.4 09/29/2021  ? HGBA1C 6.4 05/18/2021  ? HGBA1C 6.4 (A) 11/14/2020  ? HGBA1C 6.9 (H) 04/04/2020  ? ?Lab Results  ?Component Value Date  ? INSULIN 10.1 01/05/2022  ? ?Lab Results  ?Component Value Date  ? TSH 2.200 01/05/2022  ? ?Lab Results  ?Component Value Date  ? CHOL 107 01/05/2022  ? HDL 45 01/05/2022  ?  [place] 48 01/05/2022  ? LDLDIRECT 143.5 04/27/2011  ? TRIG 63 01/05/2022  ? CHOLHDL 2 09/29/2021  ? ?Lab Results  ?Component Value Date  ? VD25OH 18.0 (L) 01/05/2022  ? ?Lab Results  ?Component Value Date  ? WBC 7.4 11/05/2021  ? HGB 14.6 11/05/2021  ? HCT 45.1 11/05/2021  ? MCV 91.9 11/05/2021  ? PLT 256 11/05/2021  ? ?No results found for: IRON, TIBC, FERRITIN ? ?Obesity Behavioral Intervention:  ? ?Approximately 15 minutes were spent on the discussion below. ? ?ASK: ?We  discussed the diagnosis of obesity with [name] today and [name] to give [place] permission to discuss obesity behavioral modification therapy today. ? ?ASSESS: ?[name] the diagnosis of obesity and his BMI today Mcdaniel 36.4. [name] in the action stage of change.  ? ?ADVISE: ?[name] was educated on the multiple health risks of obesity Roger well Roger the benefit of weight loss to improve his health. He was advised of the need for long term treatment and the importance of lifestyle modifications to improve his current health and to decrease his risk of future health problems. ? ?AGREE: ?Multiple dietary modification options and treatment options were discussed and Roger Mcdaniel Mcdaniel to follow the recommendations documented in the above note. ? ?ARRANGE: ?Roger Mcdaniel was educated on the importance of frequent visits to treat obesity Roger outlined per CMS and USPSTF guidelines and Mcdaniel to schedule his next follow up appointment today. ? ?Attestation Statements:  ? ?Reviewed by clinician on day of visit: allergies, medications, problem list, medical history, surgical history, family history, social history, and previous encounter notes. ? ?I, Lennette Bihari, am acting Roger Location manager for [employer], DO. ? ?I have reviewed the above documentation for accuracy and completeness, and I agree with the above. Jearld Lesch, DO ? ?

## 2022-02-01 ENCOUNTER — Encounter (INDEPENDENT_AMBULATORY_CARE_PROVIDER_SITE_OTHER): Payer: Self-pay | Admitting: Adult Health

## 2022-02-01 ENCOUNTER — Ambulatory Visit (INDEPENDENT_AMBULATORY_CARE_PROVIDER_SITE_OTHER): Payer: Medicare Other | Admitting: Adult Health

## 2022-02-01 VITALS — BP 105/59 | HR 70 | Temp 97.8°F | Ht 64.0 in | Wt 204.0 lb

## 2022-02-01 DIAGNOSIS — E559 Vitamin D deficiency, unspecified: Secondary | ICD-10-CM

## 2022-02-01 DIAGNOSIS — Z7984 Long term (current) use of oral hypoglycemic drugs: Secondary | ICD-10-CM

## 2022-02-01 DIAGNOSIS — E669 Obesity, unspecified: Secondary | ICD-10-CM | POA: Diagnosis not present

## 2022-02-01 DIAGNOSIS — E1169 Type 2 diabetes mellitus with other specified complication: Secondary | ICD-10-CM | POA: Diagnosis not present

## 2022-02-01 DIAGNOSIS — Z6835 Body mass index (BMI) 35.0-35.9, adult: Secondary | ICD-10-CM | POA: Diagnosis not present

## 2022-02-01 DIAGNOSIS — F4321 Adjustment disorder with depressed mood: Secondary | ICD-10-CM | POA: Diagnosis not present

## 2022-02-01 DIAGNOSIS — I2510 Atherosclerotic heart disease of native coronary artery without angina pectoris: Secondary | ICD-10-CM

## 2022-02-01 MED ORDER — VITAMIN D (ERGOCALCIFEROL) 1.25 MG (50000 UNIT) PO CAPS: 50000.0000 [IU] | ORAL_CAPSULE | ORAL | 0 refills | Status: DC

## 2022-02-08 ENCOUNTER — Encounter: Payer: Self-pay | Admitting: Family Medicine

## 2022-02-08 DIAGNOSIS — Z23 Encounter for immunization: Secondary | ICD-10-CM | POA: Diagnosis not present

## 2022-02-08 NOTE — Telephone Encounter (Signed)
Updated pt's vaccines. ?

## 2022-02-09 DIAGNOSIS — F4321 Adjustment disorder with depressed mood: Secondary | ICD-10-CM | POA: Insufficient documentation

## 2022-02-09 DIAGNOSIS — E1169 Type 2 diabetes mellitus with other specified complication: Secondary | ICD-10-CM | POA: Insufficient documentation

## 2022-02-09 NOTE — Progress Notes (Signed)
Chief Complaint:   OBESITY Reco is here to discuss his progress with his obesity treatment plan along with follow-up of his obesity related diagnoses. Jhalen is on the Category 3 Plan and states he is following his eating plan approximately 90% of the time. Kenwood states he is riding his bike, walking, and using the treadmill 30 minutes 3 times per week.  Today's visit was #: 3 Starting weight: 221 lbs Starting date: 01/05/2022 Today's weight: 204 lbs Today's date: 02/09/2022 Total lbs lost to date: 17 lbs Total lbs lost since last in-office visit: 8  Interim History:  Kirkpatrick continues to be challenged to consume all foods on Cat 3 meal plan.   Two days a week,he works at [employer]. 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  Of Note:  Right total knee replacement in 12/19/2020.  Subjective:   1. Diabetes mellitus type 2 in obese (HCC) Metformin 500 mg BID with meals, managed by his PCP.   01/05/2022,  A1c 6.0, at goal.  2. Vitamin D deficiency He takes Ergocalciferol on Wednesday. He denies N/V/Muscle Weakness. He endorses stable energy levels.  3. Grief His wife of 42 years passed away in 12/19/2104. He relies on his emotional support animal- dog named "Buddy".  Assessment/Plan:   1. Diabetes mellitus type 2 in obese (HCC) Continue metformin therapy per PCP.  2. Vitamin D deficiency Continue and refill Ergocalciferol 50,000 IU once a week.  See below.  - Vitamin D, Ergocalciferol, (DRISDOL) 1.25 MG (50000 UNIT) CAPS capsule; Take 1 capsule (50,000 Units total) by mouth every 7 (seven) days.  Dispense: 5 capsule; Refill: 0  3. Grief Continue regular exercise.  Spend time with "Buddy". Consider joining local Grief Share program.  4. Obesity, Current BMI 35.1 Garan is currently in the action stage of change. As such, his goal is to continue with weight loss efforts. He has agreed to the Category 3  Plan.   Exercise goals:  As is.  Behavioral modification strategies: increasing lean protein intake, decreasing simple carbohydrates, meal planning and cooking strategies, keeping healthy foods in the home, and planning for success.  Daichi has agreed to follow-up with our clinic in 3 weeks. He was informed of the importance of frequent follow-up visits to maximize his success with intensive lifestyle modifications for his multiple health conditions.   Objective:   Blood pressure (!) 105/59, pulse 70, temperature 97.8 F (36.6 C), height 5\' 4"  (1.626 m), weight 204 lb (92.5 kg), SpO2 96 %. Body mass index is 35.02 kg/m.  General: Cooperative, alert, well developed, in no acute distress. HEENT: Conjunctivae and lids unremarkable. Cardiovascular: Regular rhythm.  Lungs: Normal work of breathing. Neurologic: No focal deficits.   Lab Results  Component Value Date   CREATININE 0.98 01/05/2022   BUN 15 01/05/2022   NA 141 01/05/2022   K 5.4 (H) 01/05/2022   CL 99 01/05/2022   CO2 27 01/05/2022   Lab Results  Component Value Date   ALT 16 01/05/2022   AST 22 01/05/2022   ALKPHOS 98 01/05/2022   BILITOT 0.8 01/05/2022   Lab Results  Component Value Date   HGBA1C 6.4 (H) 01/05/2022   HGBA1C 6.4 09/29/2021   HGBA1C 6.4 05/18/2021   HGBA1C 6.4 (A) 11/14/2020   HGBA1C 6.9 (H) 04/04/2020   Lab Results  Component Value Date   INSULIN 10.1 01/05/2022   Lab Results  Component Value Date  TSH 2.200 01/05/2022   Lab Results  Component Value Date   CHOL 107 01/05/2022   HDL 45 01/05/2022   LDLCALC 48 01/05/2022   LDLDIRECT 143.5 04/27/2011   TRIG 63 01/05/2022   CHOLHDL 2 09/29/2021   Lab Results  Component Value Date   VD25OH 18.0 (L) 01/05/2022   Lab Results  Component Value Date   WBC 7.4 11/05/2021   HGB 14.6 11/05/2021   HCT 45.1 11/05/2021   MCV 91.9 11/05/2021   PLT 256 11/05/2021   No results found for: IRON, TIBC, FERRITIN  Obesity Behavioral  Intervention:   Approximately 15 minutes were spent on the discussion below.  ASK: We discussed the diagnosis of obesity with Deondrae today and Johah agreed to give [place] permission to discuss obesity behavioral modification therapy today.  ASSESS: Jarquise has the diagnosis of obesity and his BMI today is 35.1. Edna is in the action stage of change.   ADVISE: Latrez was educated on the multiple health risks of obesity as well as the benefit of weight loss to improve his health. He was advised of the need for long term treatment and the importance of lifestyle modifications to improve his current health and to decrease his risk of future health problems.  AGREE: Multiple dietary modification options and treatment options were discussed and Yorick agreed to follow the recommendations documented in the above note.  ARRANGE: Cristiano was educated on the importance of frequent visits to treat obesity as outlined per CMS and USPSTF guidelines and agreed to schedule his next follow up appointment today.  Attestation Statements:   Reviewed by clinician on day of visit: allergies, medications, problem list, medical history, surgical history, family history, social history, and previous encounter notes.  Time spent on visit including pre-visit chart review and post-visit care and charting was 28 minutes.  Delfina Redwood, am acting as Location manager for [employer].  I have reviewed the above documentation for accuracy and completeness, and I agree with the above. -  Telly Broberg d. Sabirin Baray, NP-C

## 2022-02-15 IMAGING — CT CT CHEST W/ CM
1 series · 15 of 34 positions shown, 19 images · IV contrast (iopamidol)
Comparison: Radiography 09/17/2021.  CT abdomen 03/07/2020.

CLINICAL DATA: Chest pain, nonspecific. Abnormal chest x-ray with
lung opacities.

EXAM:
CT CHEST WITH CONTRAST
TECHNIQUE: Multidetector CT imaging of the chest was performed during
intravenous contrast administration.
CONTRAST:  75mL CSBREW-6SS IOPAMIDOL (CSBREW-6SS) INJECTION 61%

[Series 2: chest with 2mm st · axial · 0.94mm/px · z∈[+1346,+1588]mm · 15 of 143 slices shown, 19 images]
[im 11/143  mediastinal]
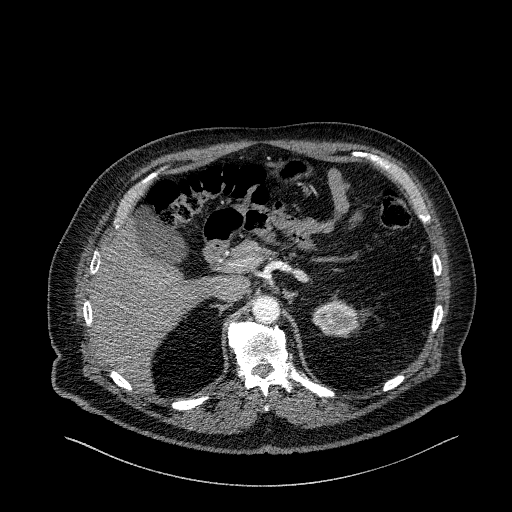
[im 11/143  lung]
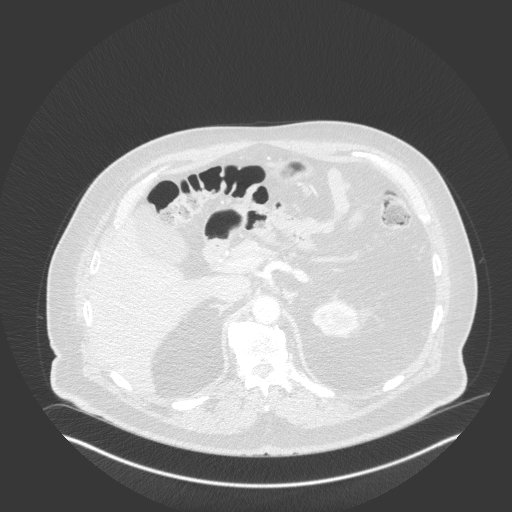
[im 22/143  lung]
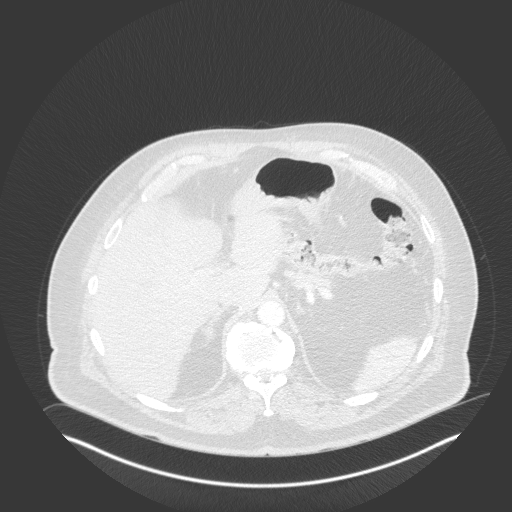
[im 29/143  lung]
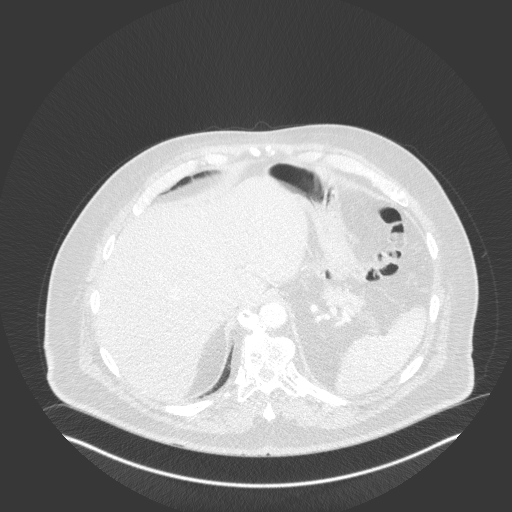
[im 37/143  lung]
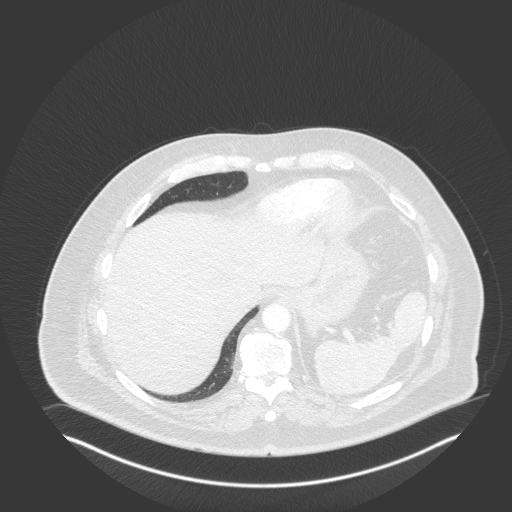
[im 48/143  mediastinal]
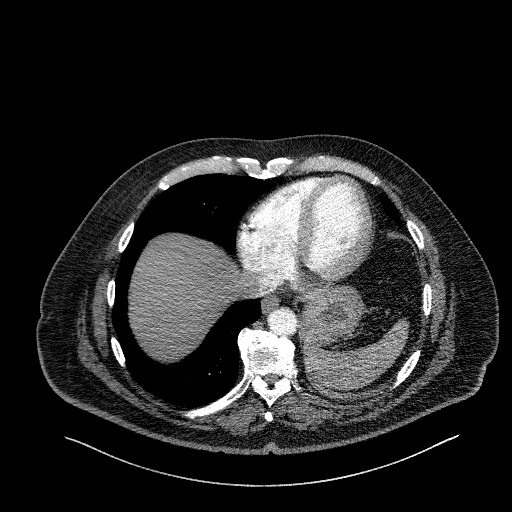
[im 48/143  lung]
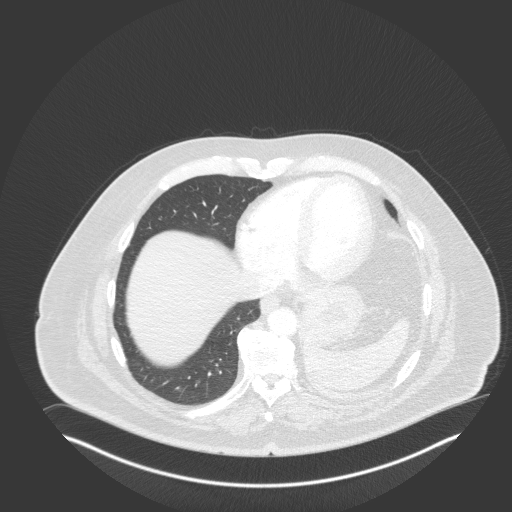
[im 57/143  lung]
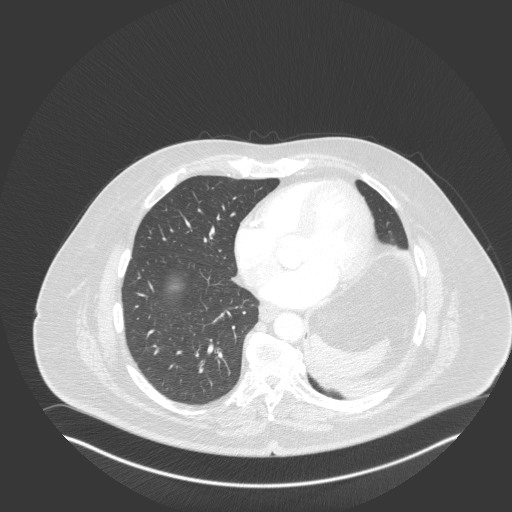
[im 64/143  lung]
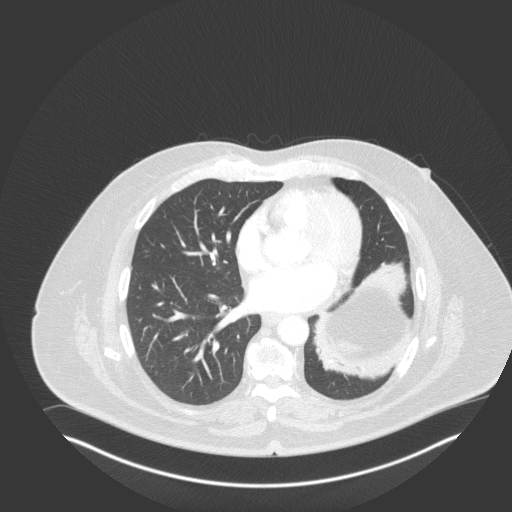
[im 74/143  lung]
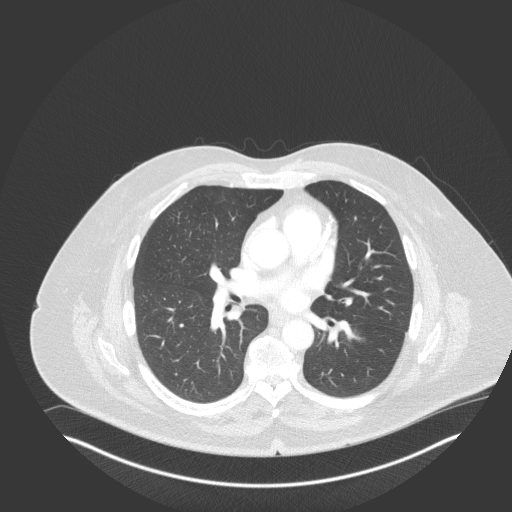
[im 79/143  mediastinal]
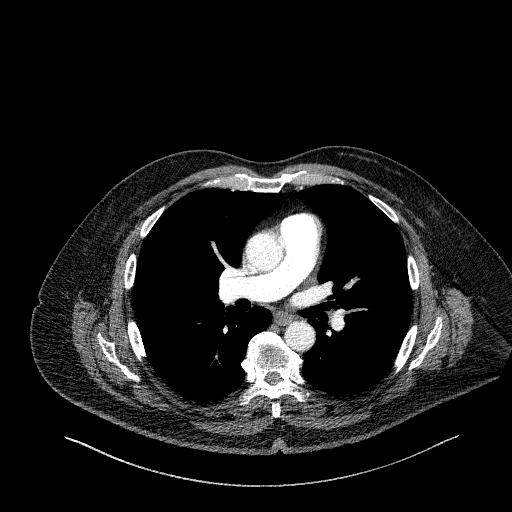
[im 79/143  lung]
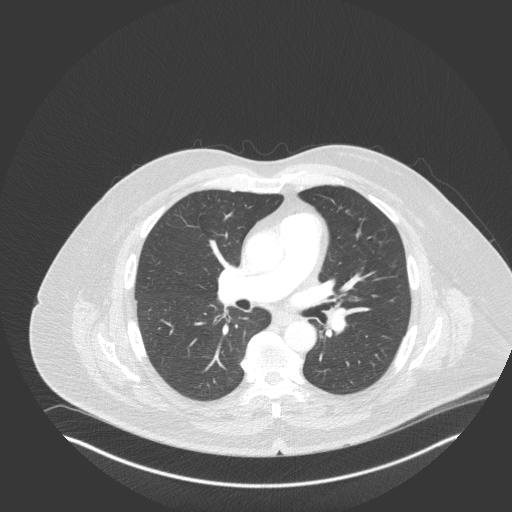
[im 86/143  lung]
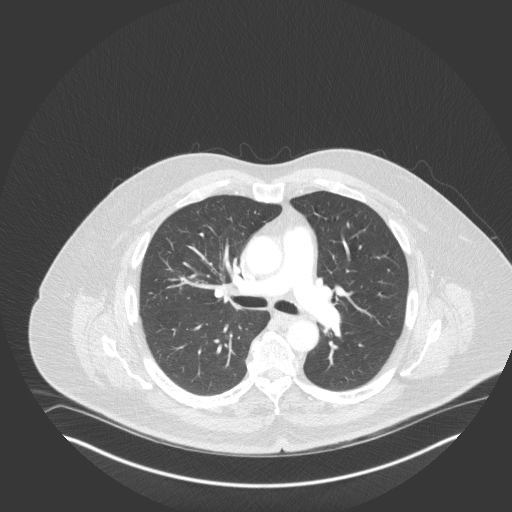
[im 95/143  lung]
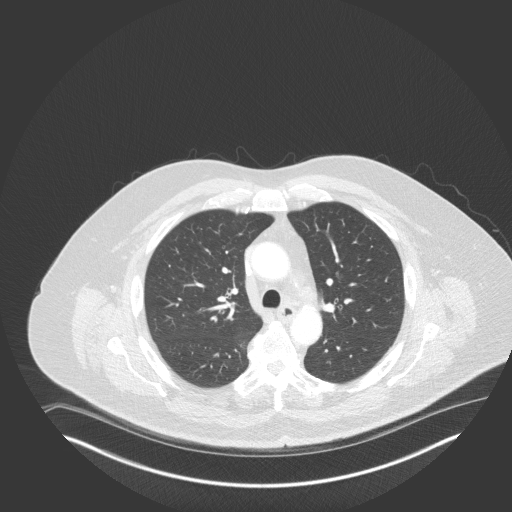
[im 106/143  lung]
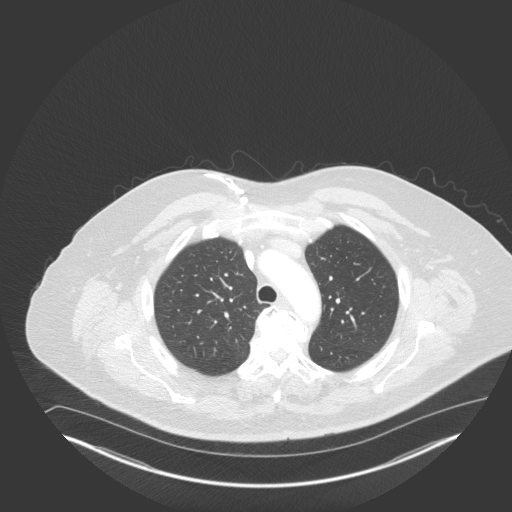
[im 114/143  mediastinal]
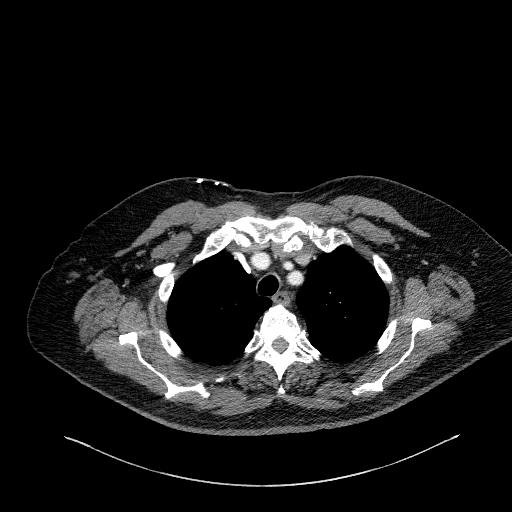
[im 114/143  lung]
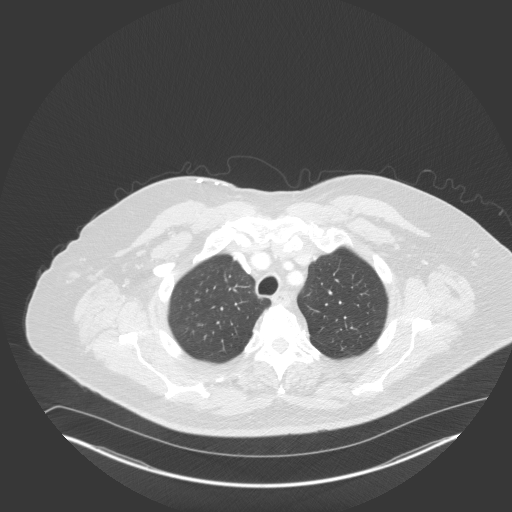
[im 121/143  lung]
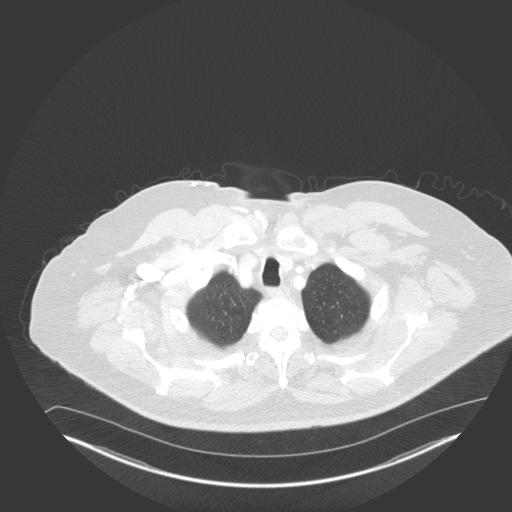
[im 132/143  lung]
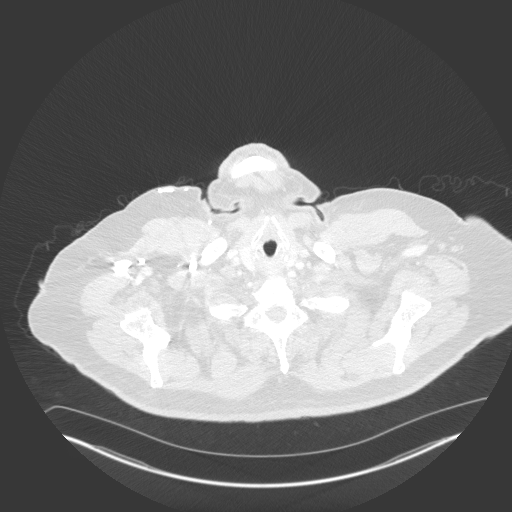

[15 of 34 positions shown; findings below may reference images not displayed]

FINDINGS: Cardiovascular: Heart size is normal. There is fairly extensive
coronary artery calcification. There is mild aortic atherosclerotic
calcification. No pericardial fluid. Pulmonary arteries appear
normal as seen.

Mediastinum/Nodes: No mediastinal or hilar mass or lymphadenopathy.

Lungs/Pleura: The right lung is clear. No infiltrate, collapse,
nodule or mass. On the left, there is partial collapse within the
lingula. There is partial collapse of the left lower lobe as seen at
prior radiography. I do not see an obstructing airway lesion serving
either region. No sign of a mass by CT. No pleural effusion on the
left.

Upper Abdomen: Negative

Musculoskeletal: Ordinary thoracic degenerative changes. Bridging
osteophytes.
IMPRESSION: Redemonstration of segmental collapse within the inferior left lower
lobe and within the lingula, similar to the previous chest
radiography. I do not see an obstructing airway lesion. No sign of
an underlying mass by CT.

Aortic Atherosclerosis ([id number]).

## 2022-03-01 ENCOUNTER — Ambulatory Visit (INDEPENDENT_AMBULATORY_CARE_PROVIDER_SITE_OTHER): Payer: Medicare Other | Admitting: Bariatrics

## 2022-03-01 ENCOUNTER — Encounter (INDEPENDENT_AMBULATORY_CARE_PROVIDER_SITE_OTHER): Payer: Self-pay | Admitting: Bariatrics

## 2022-03-01 VITALS — BP 126/62 | HR 68 | Temp 97.8°F | Ht 64.0 in | Wt 196.0 lb

## 2022-03-01 DIAGNOSIS — I10 Essential (primary) hypertension: Secondary | ICD-10-CM

## 2022-03-01 DIAGNOSIS — E669 Obesity, unspecified: Secondary | ICD-10-CM

## 2022-03-01 DIAGNOSIS — Z6833 Body mass index (BMI) 33.0-33.9, adult: Secondary | ICD-10-CM

## 2022-03-01 DIAGNOSIS — Z7984 Long term (current) use of oral hypoglycemic drugs: Secondary | ICD-10-CM | POA: Diagnosis not present

## 2022-03-01 DIAGNOSIS — Z6837 Body mass index (BMI) 37.0-37.9, adult: Secondary | ICD-10-CM

## 2022-03-01 DIAGNOSIS — E1169 Type 2 diabetes mellitus with other specified complication: Secondary | ICD-10-CM | POA: Diagnosis not present

## 2022-03-01 DIAGNOSIS — E119 Type 2 diabetes mellitus without complications: Secondary | ICD-10-CM

## 2022-03-02 NOTE — Progress Notes (Signed)
Chief Complaint:   OBESITY Roger Mcdaniel is here to discuss his progress with his obesity treatment plan along with follow-up of his obesity related diagnoses. Roger Mcdaniel is on the Category 3 Plan and states he is following his eating plan approximately 90% of the time. Roger Mcdaniel states he is riding a bike and mowing for 20 minutes 2-3 times per week.  Today's visit was #: 4 Starting weight: 221 lbs Starting date: 01/05/2022 Today's weight: 196 lbs Today's date: 03/01/2022 Total lbs lost to date: 25 lbs Total lbs lost since last in-office visit: 8 lbs  Interim History: Roger Mcdaniel's weight is down an additional 8 lbs since his last visit. He is drinking adequate fluids.   Subjective:   1. Diabetes mellitus type 2 in obese [facility]) Roger Mcdaniel is taking Glucophage currently. His appetite is normal. His fasting blood sugar was in the range of 95-104.   2. Essential hypertension, benign Roger Mcdaniel is currently taking Zestril. His blood pressure is well controlled. His blood pressure today is 126/62. He denies lightheadedness.   Assessment/Plan:   1. Diabetes mellitus type 2 in obese [facility]) Roger Mcdaniel will continue his medications. He will keep all carbohydrates low (sugar and starches). Good blood sugar control is important to decrease the likelihood of diabetic complications such as nephropathy, neuropathy, limb loss, blindness, coronary artery disease, and death. Intensive lifestyle modification including diet, exercise and weight loss are the first line of treatment for diabetes.   2. Essential hypertension, benign Roger Mcdaniel will continue his medications. He will have no added salt. He will notify primary care physician if blood pressure is too low. He is working on healthy weight loss and exercise to improve blood pressure control. We will watch for signs of hypotension as he continues his lifestyle modifications.  3. Obesity, Current BMI 33.8 Roger Mcdaniel is currently in the action stage of change. As such, his goal is to continue  with weight loss efforts. He has agreed to the Category 3 Plan.   Roger Mcdaniel will continue to adhere closely to the plan. He will continue intentional eating.   Exercise goals:  Roger Mcdaniel will is going to the [facility] on a regular basis. He will be very active.   Behavioral modification strategies: increasing lean protein intake, decreasing simple carbohydrates, increasing vegetables, increasing water intake, decreasing eating out, no skipping meals, meal planning and cooking strategies, keeping healthy foods in the home, and planning for success.  Roger Mcdaniel has agreed to follow-up with our clinic in 2-3 weeks. He was informed of the importance of frequent follow-up visits to maximize his success with intensive lifestyle modifications for his multiple health conditions.   Objective:   Blood pressure 126/62, pulse 68, temperature 97.8 F (36.6 C), height 5\' 4"  (1.626 m), weight 196 lb (88.9 kg), SpO2 97 %. Body mass index is 33.64 kg/m.  General: Cooperative, alert, well developed, in no acute distress. HEENT: Conjunctivae and lids unremarkable. Cardiovascular: Regular rhythm.  Lungs: Normal work of breathing. Neurologic: No focal deficits.   Lab Results  Component Value Date   CREATININE 0.98 01/05/2022   BUN 15 01/05/2022   NA 141 01/05/2022   K 5.4 (H) 01/05/2022   CL 99 01/05/2022   CO2 27 01/05/2022   Lab Results  Component Value Date   ALT 16 01/05/2022   AST 22 01/05/2022   ALKPHOS 98 01/05/2022   BILITOT 0.8 01/05/2022   Lab Results  Component Value Date   HGBA1C 6.4 (H) 01/05/2022   HGBA1C 6.4 09/29/2021   HGBA1C 6.4  05/18/2021   HGBA1C 6.4 (A) 11/14/2020   HGBA1C 6.9 (H) 04/04/2020   Lab Results  Component Value Date   INSULIN 10.1 01/05/2022   Lab Results  Component Value Date   TSH 2.200 01/05/2022   Lab Results  Component Value Date   CHOL 107 01/05/2022   HDL 45 01/05/2022   LDLCALC 48 01/05/2022   LDLDIRECT 143.5 04/27/2011   TRIG 63 01/05/2022   CHOLHDL 2  09/29/2021   Lab Results  Component Value Date   VD25OH 18.0 (L) 01/05/2022   Lab Results  Component Value Date   WBC 7.4 11/05/2021   HGB 14.6 11/05/2021   HCT 45.1 11/05/2021   MCV 91.9 11/05/2021   PLT 256 11/05/2021   No results found for: IRON, TIBC, FERRITIN  Attestation Statements:   Reviewed by clinician on day of visit: allergies, medications, problem list, medical history, surgical history, family history, social history, and previous encounter notes.  I, Lizbeth Bark, RMA, am acting as Location manager for [employer], DO.  I have reviewed the above documentation for accuracy and completeness, and I agree with the above. Jearld Lesch, DO

## 2022-03-08 ENCOUNTER — Encounter (INDEPENDENT_AMBULATORY_CARE_PROVIDER_SITE_OTHER): Payer: Self-pay | Admitting: Bariatrics

## 2022-03-08 NOTE — Telephone Encounter (Signed)
Dr.Brown 

## 2022-03-14 NOTE — Progress Notes (Unsigned)
Cardiology Office Note:    Date:  03/15/2022   ID:  KARANVIR BALDERSTON, DOB 07-19-1951, MRN [id number]  PCP:  Ria Bush, MD  [facility] Providers Cardiologist:  Mertie Moores, MD     Referring MD: Ria Bush, MD   Chief Complaint:  F/u for CAD    Patient Profile: Coronary artery calcification on CT scan Myoview 09/2021 low risk Hypertension Sleep apnea Hyperlipidemia Diabetes mellitus Hx of fall with LLL atelectasis on CT S/p BAL in 10/2021 >> managed w Abxs Pulmonology: Dr. Ander Slade  GERD Degenerative disc disease Obesity Aortic atherosclerosis   Prior CV Studies: GATED SPECT MYO PERF W/LEXISCAN STRESS 1D 10/13/2021 No ischemia or infarction, EF 67; low risk     Echocardiogram 10/14/2021 Mild concentric LVH, EF 55-65, no RWMA, normal diastolic function, normal RVSF, mild TR  History of Present Illness:   Roger Mcdaniel is a 71 y.o. male with the above problem list.  He was last seen by Dr. Acie Fredrickson in Jan 2023.  He had recently fallen and there was evidence of a L pneumothorax.  He was having chest pain and a stress test was arranged.  This was low risk.  He has since seen pulmonology.  Had a BAL done in feb 2023 which did show bacteria.  He was tx with Augmentin.  He returns for Cardiology f/u.  He is here alone.  He is doing very well overall.  He is exercising on a regular basis.  He goes to the [facility] 2-3 days a week and rides a recumbent bike.  He walks his dog often.  He also push mows his yard.  [facility] has been [facility] and has lost about 25 lbs.  He has not had chest pain, shortness of breath, syncope, orthopnea, leg edema.         Past Medical History:  Diagnosis Date   Back pain    Bruises easily    Chest pain    CMC arthritis, thumb, degenerative    Collapse of left lung    DDD (degenerative disc disease), cervical 2015   s/o ACDF [facility])   DM type 2 (diabetes mellitus, type 2) (HCC)    Dry skin    Edema, lower  extremity    GERD 07/10/2008   Qualifier: Diagnosis of  By: Council Mechanic MD, Hilaria Ota    GERD (gastroesophageal reflux disease) 09/2003   History of ETT 05/23/2000   Nml (Dr. Acie Fredrickson)   History of kidney stones    HNP (herniated nucleus pulposus), cervical 12/20/2013   Hyperlipidemia 09/2003   Hypertension 06/2004   Joint pain    Obesity    OSA (obstructive sleep apnea)    was on CPAP, not used  in 25-30 years    Pre-diabetes    SOB (shortness of breath)    Swallowing difficulty    Current Medications: Current Meds  Medication Sig   aspirin EC 81 MG tablet Take 1 tablet (81 mg total) by mouth daily. Swallow whole.   atorvastatin (LIPITOR) 40 MG tablet Take 1 tablet (40 mg total) by mouth daily.   Lancets (ONETOUCH DELICA PLUS [id number]) MISC CHECK SUGAR ONCE DAILY DX E11.9   lisinopril (ZESTRIL) 10 MG tablet Take 1 tablet (10 mg total) by mouth daily.   Magnesium 250 MG TABS Take 250 mg by mouth daily.   metFORMIN (GLUCOPHAGE) 500 MG tablet Take 1 tablet (500 mg total) by mouth 2 (two) times daily with a meal.  ONETOUCH ULTRA test strip CHECK BLOOD SUGAR EVERY DAY   vitamin C (ASCORBIC ACID) 500 MG tablet Take 1 tablet (500 mg total) by mouth 2 (two) times daily.   Vitamin D, Ergocalciferol, (DRISDOL) 1.25 MG (50000 UNIT) CAPS capsule Take 1 capsule (50,000 Units total) by mouth every 7 (seven) days.   zinc gluconate 50 MG tablet Take 50 mg by mouth daily.    Allergies:   Codeine sulfate and Pravachol [pravastatin sodium]   Social History   Tobacco Use   Smoking status: Never   Smokeless tobacco: Never  Vaping Use   Vaping Use: Never used  Substance Use Topics   Alcohol use: Not Currently    Alcohol/week: 1.0 standard drink of alcohol    Types: 1 Standard drinks or equivalent per week   Drug use: No    Family Hx: The patient's family history includes CVA in his mother; Cancer in his mother and another family member; Heart disease in his father and paternal grandfather;  Hypertension in his brother; Obesity in his brother.  Review of Systems  Hematologic/Lymphatic: Bruises/bleeds easily.  Gastrointestinal:  Negative for hematochezia and melena.     EKGs/Labs/Other Test Reviewed:    EKG:  EKG is not ordered today.  The ekg ordered today demonstrates n/a  Recent Labs: 11/05/2021: Hemoglobin 14.6; Platelets 256 01/05/2022: ALT 16; BUN 15; Creatinine, Ser 0.98; Potassium 5.4; Sodium 141; TSH 2.200   Recent Lipid Panel Recent Labs    09/29/21 1008 01/05/22 0858  CHOL 106 107  TRIG 77.0 63  HDL 45.60 45  VLDL 15.4  --   LDLCALC 45 48     Risk Assessment/Calculations/Metrics:              Physical Exam:    VS:  BP (!) 120/50   Pulse 64   Ht _0  (1.626 m)   Wt 198 lb 3.2 oz (89.9 kg)   SpO2 97%   BMI 34.02 kg/m     Wt Readings from Last 3 Encounters:  03/15/22 198 lb 3.2 oz (89.9 kg)  03/01/22 196 lb (88.9 kg)  02/01/22 204 lb (92.5 kg)    Constitutional:      Appearance: Healthy appearance. Not in distress.  Neck:     Vascular: JVD normal.  Pulmonary:     Effort: Pulmonary effort is normal.     Breath sounds: No wheezing. No rales.  Cardiovascular:     Normal rate. Regular rhythm. Normal S1. Normal S2.      Murmurs: There is no murmur.  Edema:    Peripheral edema absent.  Abdominal:     Palpations: Abdomen is soft.  Skin:    General: Skin is warm and dry.  Neurological:     General: No focal deficit present.     Mental Status: Alert and oriented to person, place and time.          ASSESSMENT & PLAN:   Essential hypertension, benign BP is controlled. As he loses weight, we may need to cut back on his dose of Lisinopril.   Hyperlipidemia associated with type 2 diabetes mellitus (HCC) Recent LDL was optimal on his current dose of Atorvastatin.   CAD (coronary artery disease) Ca2+ on prior CT.  Myoview in jan 2023 was low risk.  He is not having symptoms to suggest angina and remains on ASA, Atorvastatin.    Atherosclerosis of aorta [facility]) He is currently on ASA, statin Rx.  Dispo:  Return in about 6 months (around 09/14/2022) for Routine Follow Up, w/ Dr. Acie Fredrickson.   Medication Adjustments/Labs and Tests Ordered: Current medicines are reviewed at length with the patient today.  Concerns regarding medicines are outlined above.  Tests Ordered: No orders of the defined types were placed in this encounter.  Medication Changes: No orders of the defined types were placed in this encounter.  Signed, Richardson Dopp, PA-C  03/15/2022 8:28 AM    [facility] [address], [place], [place]  [address] Phone: [phone]; Fax: [phone]

## 2022-03-15 ENCOUNTER — Ambulatory Visit (INDEPENDENT_AMBULATORY_CARE_PROVIDER_SITE_OTHER): Payer: Medicare Other | Admitting: Physician Assistant

## 2022-03-15 ENCOUNTER — Encounter: Payer: Self-pay | Admitting: Physician Assistant

## 2022-03-15 VITALS — BP 120/50 | HR 64 | Ht 64.0 in | Wt 198.2 lb

## 2022-03-15 DIAGNOSIS — E785 Hyperlipidemia, unspecified: Secondary | ICD-10-CM

## 2022-03-15 DIAGNOSIS — E1169 Type 2 diabetes mellitus with other specified complication: Secondary | ICD-10-CM | POA: Diagnosis not present

## 2022-03-15 DIAGNOSIS — I10 Essential (primary) hypertension: Secondary | ICD-10-CM

## 2022-03-15 DIAGNOSIS — I700 Atherosclerosis of aorta: Secondary | ICD-10-CM | POA: Diagnosis not present

## 2022-03-15 DIAGNOSIS — I2510 Atherosclerotic heart disease of native coronary artery without angina pectoris: Secondary | ICD-10-CM | POA: Diagnosis not present

## 2022-03-15 NOTE — Assessment & Plan Note (Signed)
Recent LDL was optimal on his current dose of Atorvastatin.

## 2022-03-15 NOTE — Patient Instructions (Addendum)
Medication Instructions:  Your physician recommends that you continue on your current medications as directed. Please refer to the Current Medication list given to you today.  *If you need a refill on your cardiac medications before your next appointment, please call your pharmacy*   Lab Work: None If you have labs (blood work) drawn today and your tests are completely normal, you will receive your results only by: MyChart Message (if you have MyChart) OR A paper copy in the mail If you have any lab test that is abnormal or we need to change your treatment, we will call you to review the results.   Follow-Up: At [facility], you and your health needs are our priority.  As part of our continuing mission to provide you with exceptional heart care, we have created designated Provider Care Teams.  These Care Teams include your primary Cardiologist (physician) and Advanced Practice Providers (APPs -  Physician Assistants and Nurse Practitioners) who all work together to provide you with the care you need, when you need it.  We recommend signing up for the patient portal called "MyChart".  Sign up information is provided on this After Visit Summary.  MyChart is used to connect with patients for Virtual Visits (Telemedicine).  Patients are able to view lab/test results, encounter notes, upcoming appointments, etc.  Non-urgent messages can be sent to your provider as well.   To learn more about what you can do with MyChart, go to [email or web address].    Your next appointment:   6 month(s) 09/14/2022 at 8:00 AM  The format for your next appointment:   In Person  Provider:   Dr Elease Hashimoto  Important Information About Sugar

## 2022-03-15 NOTE — Assessment & Plan Note (Signed)
Ca2+ on prior CT.  Myoview in jan 2023 was low risk.  He is not having symptoms to suggest angina and remains on ASA, Atorvastatin.

## 2022-03-15 NOTE — Assessment & Plan Note (Signed)
He is currently on ASA, statin Rx.

## 2022-03-15 NOTE — Assessment & Plan Note (Signed)
BP is controlled. As he loses weight, we may need to cut back on his dose of Lisinopril.

## 2022-03-16 ENCOUNTER — Ambulatory Visit (INDEPENDENT_AMBULATORY_CARE_PROVIDER_SITE_OTHER): Payer: Medicare Other | Admitting: Adult Health

## 2022-03-16 ENCOUNTER — Encounter (INDEPENDENT_AMBULATORY_CARE_PROVIDER_SITE_OTHER): Payer: Self-pay | Admitting: Adult Health

## 2022-03-16 VITALS — BP 136/68 | HR 59 | Temp 98.1°F | Ht 64.0 in | Wt 192.0 lb

## 2022-03-16 DIAGNOSIS — Z7985 Long-term (current) use of injectable non-insulin antidiabetic drugs: Secondary | ICD-10-CM

## 2022-03-16 DIAGNOSIS — Z6833 Body mass index (BMI) 33.0-33.9, adult: Secondary | ICD-10-CM

## 2022-03-16 DIAGNOSIS — E559 Vitamin D deficiency, unspecified: Secondary | ICD-10-CM | POA: Diagnosis not present

## 2022-03-16 DIAGNOSIS — E669 Obesity, unspecified: Secondary | ICD-10-CM

## 2022-03-16 DIAGNOSIS — E1169 Type 2 diabetes mellitus with other specified complication: Secondary | ICD-10-CM | POA: Diagnosis not present

## 2022-03-16 MED ORDER — VITAMIN D (ERGOCALCIFEROL) 1.25 MG (50000 UNIT) PO CAPS: 50000.0000 [IU] | ORAL_CAPSULE | ORAL | 0 refills | Status: DC

## 2022-03-17 NOTE — Progress Notes (Signed)
Chief Complaint:   OBESITY Roger Mcdaniel is here to discuss his progress with his obesity treatment plan along with follow-up of his obesity related diagnoses. Roger Mcdaniel is on the Category 3 Plan and states he is following his eating plan approximately 80-90% of the time. Roger Mcdaniel states he is riding his stationary bike and/or walking on the treadmill for 45 to 60 minutes 2 times per week.  He also push mows his yard 1 time per week and walks his dog.  Today's visit was #: 5 Starting weight: 221 lbs Starting date: 01/05/2022 Today's weight: 192 lbs Today's date: 03/16/22 Total lbs lost to date: 29 Total lbs lost since last in-office visit: -4  Interim History:  He has increased stationary bike distance to greater than 5 miles. He provided a "typical day of intake:: Breakfast-eggs with sausage patty or link-sometimes with wrap Lunch-45-calorie bread or 50-calorie wrap-low-sodium chicken breast with vegetables Dinner-healthy choice microwave meal or 3 to 4 ounces of meat protein with vegetables.  Goal-175 pounds (BMI 30) When he was in [employer]-he weighed 185 pounds.  He is considering taking a 5 to 6-day trip to [facility] this summer.  Subjective:   1. Vitamin D deficiency 01/05/2022 vitamin D level-18: well below goal of 50-70. He is on Ergocalciferol-denies N/V/Muscle Weakness.  2. Diabetes mellitus type 2 in obese [facility]) PCP manages metformin 500 mg twice daily with meals. 01/05/2022 A1c-6.4-well controlled. Home readings-fasting 85, 100, 97, 94, 91, 104, 98. He denies sx's of hypoglycemia. He is followed by PCP every 6 months.  Assessment/Plan:   1. Vitamin D deficiency Refill- Vitamin D, Ergocalciferol, (DRISDOL) 1.25 MG (50000 UNIT) CAPS capsule; Take 1 capsule (50,000 Units total) by mouth every 7 (seven) days.  Dispense: 5 capsule; Refill: 0  2. Diabetes mellitus type 2 in obese (HCC) Continue metformin as directed by PCP.  3. Obesity, Current BMI 33.1 Roger Mcdaniel is currently  in the action stage of change. As such, his goal is to continue with weight loss efforts. He has agreed to the Category 3 Plan.   Exercise goals: as is  Behavioral modification strategies: increasing lean protein intake, decreasing simple carbohydrates, increasing vegetables, meal planning and cooking strategies, keeping healthy foods in the home, travel eating strategies, and avoiding temptations.  Roger Mcdaniel has agreed to follow-up with our clinic in 3-4 weeks. He was informed of the importance of frequent follow-up visits to maximize his success with intensive lifestyle modifications for his multiple health conditions.    Objective:   Blood pressure 136/68, pulse (!) 59, temperature 98.1 F (36.7 C), height 5\' 4"  (1.626 m), weight 192 lb (87.1 kg), SpO2 98 %. Body mass index is 32.96 kg/m.  General: Cooperative, alert, well developed, in no acute distress. HEENT: Conjunctivae and lids unremarkable. Cardiovascular: Regular rhythm.  Lungs: Normal work of breathing. Neurologic: No focal deficits.   Lab Results  Component Value Date   CREATININE 0.98 01/05/2022   BUN 15 01/05/2022   NA 141 01/05/2022   K 5.4 (H) 01/05/2022   CL 99 01/05/2022   CO2 27 01/05/2022   Lab Results  Component Value Date   ALT 16 01/05/2022   AST 22 01/05/2022   ALKPHOS 98 01/05/2022   BILITOT 0.8 01/05/2022   Lab Results  Component Value Date   HGBA1C 6.4 (H) 01/05/2022   HGBA1C 6.4 09/29/2021   HGBA1C 6.4 05/18/2021   HGBA1C 6.4 (A) 11/14/2020   HGBA1C 6.9 (H) 04/04/2020   Lab Results  Component Value  Date   INSULIN 10.1 01/05/2022   Lab Results  Component Value Date   TSH 2.200 01/05/2022   Lab Results  Component Value Date   CHOL 107 01/05/2022   HDL 45 01/05/2022   LDLCALC 48 01/05/2022   LDLDIRECT 143.5 04/27/2011   TRIG 63 01/05/2022   CHOLHDL 2 09/29/2021   Lab Results  Component Value Date   VD25OH 18.0 (L) 01/05/2022   Lab Results  Component Value Date   WBC 7.4  11/05/2021   HGB 14.6 11/05/2021   HCT 45.1 11/05/2021   MCV 91.9 11/05/2021   PLT 256 11/05/2021   No results found for: "IRON", "TIBC", "FERRITIN"  Obesity Behavioral Intervention:   Approximately 15 minutes were spent on the discussion below.  ASK: We discussed the diagnosis of obesity with Roger Mcdaniel today and Roger Mcdaniel agreed to give [place] permission to discuss obesity behavioral modification therapy today.  ASSESS: Roger Mcdaniel has the diagnosis of obesity and his BMI today is 33.1. Roger Mcdaniel is in the action stage of change.   ADVISE: Roger Mcdaniel was educated on the multiple health risks of obesity as well as the benefit of weight loss to improve his health. He was advised of the need for long term treatment and the importance of lifestyle modifications to improve his current health and to decrease his risk of future health problems.  AGREE: Multiple dietary modification options and treatment options were discussed and Roger Mcdaniel agreed to follow the recommendations documented in the above note.  ARRANGE: Roger Mcdaniel was educated on the importance of frequent visits to treat obesity as outlined per CMS and USPSTF guidelines and agreed to schedule his next follow up appointment today.  Attestation Statements:   Reviewed by clinician on day of visit: allergies, medications, problem list, medical history, surgical history, family history, social history, and previous encounter notes.  I, Roger Sans, FNP, am acting as Energy manager for Roger Hamburger, NP.  I have reviewed the above documentation for accuracy and completeness, and I agree with the above. -  Roger Savannah d. Robina Hamor, NP-C

## 2022-03-18 DIAGNOSIS — Z4789 Encounter for other orthopedic aftercare: Secondary | ICD-10-CM | POA: Diagnosis not present

## 2022-04-02 DIAGNOSIS — M25641 Stiffness of right hand, not elsewhere classified: Secondary | ICD-10-CM | POA: Diagnosis not present

## 2022-04-12 ENCOUNTER — Encounter: Payer: Self-pay | Admitting: Family Medicine

## 2022-04-13 ENCOUNTER — Encounter (INDEPENDENT_AMBULATORY_CARE_PROVIDER_SITE_OTHER): Payer: Self-pay | Admitting: Bariatrics

## 2022-04-13 ENCOUNTER — Ambulatory Visit (INDEPENDENT_AMBULATORY_CARE_PROVIDER_SITE_OTHER): Payer: Medicare Other | Admitting: Bariatrics

## 2022-04-13 VITALS — BP 127/67 | HR 67 | Temp 97.8°F | Ht 64.0 in | Wt 182.0 lb

## 2022-04-13 DIAGNOSIS — E669 Obesity, unspecified: Secondary | ICD-10-CM | POA: Diagnosis not present

## 2022-04-13 DIAGNOSIS — E559 Vitamin D deficiency, unspecified: Secondary | ICD-10-CM

## 2022-04-13 DIAGNOSIS — Z7984 Long term (current) use of oral hypoglycemic drugs: Secondary | ICD-10-CM | POA: Diagnosis not present

## 2022-04-13 DIAGNOSIS — Z6837 Body mass index (BMI) 37.0-37.9, adult: Secondary | ICD-10-CM

## 2022-04-13 DIAGNOSIS — E1169 Type 2 diabetes mellitus with other specified complication: Secondary | ICD-10-CM | POA: Diagnosis not present

## 2022-04-13 DIAGNOSIS — Z6831 Body mass index (BMI) 31.0-31.9, adult: Secondary | ICD-10-CM | POA: Diagnosis not present

## 2022-04-13 MED ORDER — VITAMIN D (ERGOCALCIFEROL) 1.25 MG (50000 UNIT) PO CAPS: 50000.0000 [IU] | ORAL_CAPSULE | ORAL | 0 refills | Status: DC

## 2022-04-13 NOTE — Telephone Encounter (Signed)
Updated pt's chart.  

## 2022-04-19 NOTE — Progress Notes (Unsigned)
Chief Complaint:   OBESITY Obe is here to discuss his progress with his obesity treatment plan along with follow-up of his obesity related diagnoses. Roger Mcdaniel is on the Category 3 Plan and states he is following his eating plan approximately 75% of the time. [name] states he is doing yard work, and riding the exercise bike for 5 miles 2-3 times per week.    Today's visit was #: 6 Starting weight: 221 lbs Starting date: 01/05/2022 Today's weight: 182 lbs Today's date: 04/13/2022 Total lbs lost to date: 39 Total lbs lost since last in-office visit: 2  Interim History: Roger Mcdaniel is down an additional 2 pounds, and he has done well overall.  Subjective:   1. Vitamin D deficiency Roger Mcdaniel is taking prescription vitamin D as directed.  2. Diabetes mellitus type 2 in obese Roger Mcdaniel) [name] is currently taking metformin.  His fasting blood sugars range in the 90's.  Assessment/Plan:   1. Vitamin D deficiency Roger Mcdaniel will continue taking prescription vitamin D 50,000 units once weekly, and we will refill for 1 month.  - Vitamin D, Ergocalciferol, (DRISDOL) 1.25 MG (50000 UNIT) CAPS capsule; Take 1 capsule (50,000 Units total) by mouth every 7 (seven) days.  Dispense: 5 capsule; Refill: 0  2. Diabetes mellitus type 2 in obese [facility]) Roger Mcdaniel will continue taking metformin as directed.  3. Obesity, Current BMI 31.4 Roger Mcdaniel is currently in the action stage of change. As such, his goal is to continue with weight loss efforts. He has agreed to the Category 3 Plan.   Meal planning was discussed.  He will adhere to the plan 80-90%.  Protein shakes were discussed.  Exercise goals: As is.  Behavioral modification strategies: increasing lean protein intake, decreasing simple carbohydrates, increasing vegetables, increasing water intake, decreasing eating out, no skipping meals, meal planning and cooking strategies, keeping healthy foods in the home, and planning for success.  Roger Mcdaniel has agreed to follow-up  with our clinic in 2 weeks with myself, or with William Hamburger, NP. He was informed of the importance of frequent follow-up visits to maximize his success with intensive lifestyle modifications for his multiple health conditions.   Objective:   Blood pressure 127/67, pulse 67, temperature 97.8 F (36.6 C), height 5\' 4"  (1.626 m), weight 182 lb (82.6 kg), SpO2 97 %. Body mass index is 31.24 kg/m.  General: Cooperative, alert, well developed, in no acute distress. HEENT: Conjunctivae and lids unremarkable. Cardiovascular: Regular rhythm.  Lungs: Normal work of breathing. Neurologic: No focal deficits.   Lab Results  Component Value Date   CREATININE 0.98 01/05/2022   BUN 15 01/05/2022   NA 141 01/05/2022   K 5.4 (H) 01/05/2022   CL 99 01/05/2022   CO2 27 01/05/2022   Lab Results  Component Value Date   ALT 16 01/05/2022   AST 22 01/05/2022   ALKPHOS 98 01/05/2022   BILITOT 0.8 01/05/2022   Lab Results  Component Value Date   HGBA1C 6.4 (H) 01/05/2022   HGBA1C 6.4 09/29/2021   HGBA1C 6.4 05/18/2021   HGBA1C 6.4 (A) 11/14/2020   HGBA1C 6.9 (H) 04/04/2020   Lab Results  Component Value Date   INSULIN 10.1 01/05/2022   Lab Results  Component Value Date   TSH 2.200 01/05/2022   Lab Results  Component Value Date   CHOL 107 01/05/2022   HDL 45 01/05/2022   LDLCALC 48 01/05/2022   LDLDIRECT 143.5 04/27/2011   TRIG 63 01/05/2022   CHOLHDL 2 09/29/2021   Lab  Results  Component Value Date   VD25OH 18.0 (L) 01/05/2022   Lab Results  Component Value Date   WBC 7.4 11/05/2021   HGB 14.6 11/05/2021   HCT 45.1 11/05/2021   MCV 91.9 11/05/2021   PLT 256 11/05/2021   No results found for: "IRON", "TIBC", "FERRITIN"  Attestation Statements:   Reviewed by clinician on day of visit: allergies, medications, problem list, medical history, surgical history, family history, social history, and previous encounter notes.   Trude Mcburney, am acting as Energy manager  for [employer], DO.  I have reviewed the above documentation for accuracy and completeness, and I agree with the above. Corinna Capra, DO

## 2022-04-20 ENCOUNTER — Encounter (INDEPENDENT_AMBULATORY_CARE_PROVIDER_SITE_OTHER): Payer: Self-pay | Admitting: Bariatrics

## 2022-05-03 DIAGNOSIS — E119 Type 2 diabetes mellitus without complications: Secondary | ICD-10-CM | POA: Diagnosis not present

## 2022-05-03 LAB — HM DIABETES EYE EXAM

## 2022-05-04 ENCOUNTER — Encounter: Payer: Self-pay | Admitting: Family Medicine

## 2022-05-05 ENCOUNTER — Encounter (INDEPENDENT_AMBULATORY_CARE_PROVIDER_SITE_OTHER): Payer: Self-pay

## 2022-05-05 ENCOUNTER — Encounter: Payer: Self-pay | Admitting: Family Medicine

## 2022-05-09 ENCOUNTER — Other Ambulatory Visit: Payer: Self-pay | Admitting: Bariatrics

## 2022-05-15 ENCOUNTER — Other Ambulatory Visit: Payer: Self-pay | Admitting: Family Medicine

## 2022-05-15 DIAGNOSIS — Z125 Encounter for screening for malignant neoplasm of prostate: Secondary | ICD-10-CM

## 2022-05-15 DIAGNOSIS — E1169 Type 2 diabetes mellitus with other specified complication: Secondary | ICD-10-CM

## 2022-05-15 DIAGNOSIS — E118 Type 2 diabetes mellitus with unspecified complications: Secondary | ICD-10-CM

## 2022-05-15 DIAGNOSIS — E559 Vitamin D deficiency, unspecified: Secondary | ICD-10-CM

## 2022-05-17 ENCOUNTER — Other Ambulatory Visit (INDEPENDENT_AMBULATORY_CARE_PROVIDER_SITE_OTHER): Payer: Medicare Other

## 2022-05-17 ENCOUNTER — Ambulatory Visit: Payer: Medicare Other

## 2022-05-17 DIAGNOSIS — E118 Type 2 diabetes mellitus with unspecified complications: Secondary | ICD-10-CM | POA: Diagnosis not present

## 2022-05-17 DIAGNOSIS — E785 Hyperlipidemia, unspecified: Secondary | ICD-10-CM | POA: Diagnosis not present

## 2022-05-17 DIAGNOSIS — Z125 Encounter for screening for malignant neoplasm of prostate: Secondary | ICD-10-CM | POA: Diagnosis not present

## 2022-05-17 DIAGNOSIS — E559 Vitamin D deficiency, unspecified: Secondary | ICD-10-CM | POA: Diagnosis not present

## 2022-05-17 DIAGNOSIS — E1169 Type 2 diabetes mellitus with other specified complication: Secondary | ICD-10-CM

## 2022-05-17 LAB — LIPID PANEL
Cholesterol: 96 mg/dL (ref 0–200)
HDL: 46.3 mg/dL (ref >39.00)
LDL Cholesterol: 40 mg/dL (ref 0–99)
NonHDL: 50.1
Total CHOL/HDL Ratio: 2
Triglycerides: 50 mg/dL (ref 0.0–149.0)
VLDL: 10 mg/dL (ref 0.0–40.0)

## 2022-05-17 LAB — COMPREHENSIVE METABOLIC PANEL
ALT: 14 U/L (ref 0–53)
AST: 18 U/L (ref 0–37)
Albumin: 4 g/dL (ref 3.5–5.2)
Alkaline Phosphatase: 73 U/L (ref 39–117)
BUN: 19 mg/dL (ref 6–23)
CO2: 29 mEq/L (ref 19–32)
Calcium: 9.3 mg/dL (ref 8.4–10.5)
Chloride: 102 mEq/L (ref 96–112)
Creatinine, Ser: 0.99 mg/dL (ref 0.40–1.50)
GFR: 77.5 mL/min (ref >60.00)
Glucose, Bld: 95 mg/dL (ref 70–99)
Potassium: 4.6 mEq/L (ref 3.5–5.1)
Sodium: 139 mEq/L (ref 135–145)
Total Bilirubin: 1.1 mg/dL (ref 0.2–1.2)
Total Protein: 6.1 g/dL (ref 6.0–8.3)

## 2022-05-17 LAB — VITAMIN D 25 HYDROXY (VIT D DEFICIENCY, FRACTURES): VITD: 66.25 ng/mL (ref 30.00–100.00)

## 2022-05-17 LAB — MICROALBUMIN / CREATININE URINE RATIO
Creatinine,U: 185.1 mg/dL
Microalb Creat Ratio: 2.3 mg/g (ref 0.0–30.0)
Microalb, Ur: 4.3 mg/dL — ABNORMAL HIGH (ref 0.0–1.9)

## 2022-05-17 LAB — PSA, MEDICARE: PSA: 1.37 ng/ml (ref 0.10–4.00)

## 2022-05-17 LAB — HEMOGLOBIN A1C: Hgb A1c MFr Bld: 5.9 % (ref 4.6–6.5)

## 2022-05-24 ENCOUNTER — Ambulatory Visit (INDEPENDENT_AMBULATORY_CARE_PROVIDER_SITE_OTHER): Payer: Medicare Other | Admitting: Family Medicine

## 2022-05-24 ENCOUNTER — Encounter: Payer: Self-pay | Admitting: Family Medicine

## 2022-05-24 VITALS — BP 132/62 | HR 58 | Temp 97.9°F | Ht 63.75 in | Wt 175.0 lb

## 2022-05-24 DIAGNOSIS — I700 Atherosclerosis of aorta: Secondary | ICD-10-CM | POA: Diagnosis not present

## 2022-05-24 DIAGNOSIS — I10 Essential (primary) hypertension: Secondary | ICD-10-CM

## 2022-05-24 DIAGNOSIS — E118 Type 2 diabetes mellitus with unspecified complications: Secondary | ICD-10-CM | POA: Diagnosis not present

## 2022-05-24 DIAGNOSIS — E785 Hyperlipidemia, unspecified: Secondary | ICD-10-CM | POA: Diagnosis not present

## 2022-05-24 DIAGNOSIS — E1169 Type 2 diabetes mellitus with other specified complication: Secondary | ICD-10-CM

## 2022-05-24 DIAGNOSIS — E669 Obesity, unspecified: Secondary | ICD-10-CM | POA: Diagnosis not present

## 2022-05-24 DIAGNOSIS — E559 Vitamin D deficiency, unspecified: Secondary | ICD-10-CM

## 2022-05-24 DIAGNOSIS — I2510 Atherosclerotic heart disease of native coronary artery without angina pectoris: Secondary | ICD-10-CM

## 2022-05-24 DIAGNOSIS — Z96651 Presence of right artificial knee joint: Secondary | ICD-10-CM | POA: Diagnosis not present

## 2022-05-24 DIAGNOSIS — Z1211 Encounter for screening for malignant neoplasm of colon: Secondary | ICD-10-CM | POA: Diagnosis not present

## 2022-05-24 DIAGNOSIS — E66811 Obesity, class 1: Secondary | ICD-10-CM

## 2022-05-24 DIAGNOSIS — Z0000 Encounter for general adult medical examination without abnormal findings: Secondary | ICD-10-CM

## 2022-05-24 MED ORDER — METFORMIN HCL 500 MG PO TABS: 500.0000 mg | ORAL_TABLET | Freq: Every day | ORAL | 3 refills | Status: DC

## 2022-05-24 MED ORDER — ATORVASTATIN CALCIUM 40 MG PO TABS: 40.0000 mg | ORAL_TABLET | Freq: Every day | ORAL | 3 refills | Status: DC

## 2022-05-24 NOTE — Progress Notes (Signed)
Patient ID: Roger Mcdaniel, male    DOB: 18-Oct-1950, 71 y.o.   MRN: [id number]  This visit was conducted in person.  BP 132/62   Pulse (!) 58   Temp 97.9 F (36.6 C) (Temporal)   Ht 5' 3.75" (1.619 m)   Wt 175 lb (79.4 kg)   SpO2 96%   BMI 30.27 kg/m   Home BP stays 758 systolic  Brings home log 83-254 systolic   CC: AMW Subjective:   HPI: Roger Mcdaniel is a 71 y.o. male presenting on 05/24/2022 for Medicare Wellness   Did not see health advisor this year.   Hearing Screening   '500Hz'  '1000Hz'  '2000Hz'  '4000Hz'   Right ear 25 40 20 0  Left ear '20 25 20 ' 0  Vision Screening - Comments:: Last eye exam, 04/2022.  [place] Office Visit from 05/24/2022 in [place] [facility] [place]  PHQ-2 Total Score 0          05/24/2022    9:19 AM 05/22/2021   10:18 AM 04/04/2020    9:10 AM 08/22/2019   11:56 AM 12/12/2017    8:35 AM  Fall Risk   Falls in the past year? 1 0 0 0 No  Comment    [facility]mmi Telephone Survey: data to providers prior to load   Number falls in past yr: 0 0 0    Injury with Fall? 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 PFTs WNL. Notes persistent left chest discomfort when sneezing hard.   He's been seeing [facility] with 48 lb weight loss over the past 6 months! He's been able to fully come off lisinopril. [facility] on bicycle, push mows 2 acre yard weekly. Walks dog through downtown [place]. Started vitamin D through them.   R total knee replacement ([place]) 11/2020.    Preventative: Colon cancer screening - yearly stool kit. No h/o blood in stool. declines colonoscopy Prostate cancer screening - discussed.  Would like to continue yearly PSA.   Lung cancer screening - not eligible  Flu shot - yearly [place] 09/2019, 10/2019, booster x2 05/2020, 12/2020, bivalent 05/2021, 01/2022  Td 2000, Tdap 2012  Prevnar-13 11/2017, pnuemovax-23 09/2019, 12/2020  Shingrix - 01/2022, 03/2022 Advanced directives received and scanned 05/2017. HCPOA are son Heath Lark then DIL Heather. Grants discretion to [facility] to make decisions for life-prolonging measures.  Seat belt use discussed  Sunscreen use discussed. No changing moles on skin.  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 Interim medical history since our last visit reviewed. Allergies and medications reviewed and updated.  Outpatient Medications Prior to Visit  Medication Sig Dispense Refill   aspirin EC 81 MG tablet Take 1 tablet (81 mg total) by mouth daily. Swallow whole. 30 tablet 0   Lancets (ONETOUCH DELICA PLUS ZOXWRU04V) MISC CHECK SUGAR ONCE DAILY DX E11.9 100 each 2   Magnesium 250 MG TABS Take 250 mg by mouth daily.     ONETOUCH ULTRA test strip CHECK BLOOD SUGAR EVERY DAY 100 strip 3   vitamin C (ASCORBIC ACID) 500 MG tablet Take 1 tablet (500 mg total) by mouth 2 (two) times daily.     Vitamin D, Ergocalciferol, (DRISDOL) 1.25 MG (50000 UNIT) CAPS capsule Take 1 capsule (50,000 Units total) by mouth every 7 (seven) days. 5 capsule 0   zinc gluconate 50 MG tablet Take 50 mg by mouth daily.     atorvastatin (LIPITOR) 40 MG tablet Take 1  tablet (40 mg total) by mouth daily. 90 tablet 3   metFORMIN (GLUCOPHAGE) 500 MG tablet Take 1 tablet (500 mg total) by mouth 2 (two) times daily with a meal. 180 tablet 3   lisinopril (ZESTRIL) 10 MG tablet Take 1 tablet (10 mg total) by mouth daily. (Patient not taking: Reported on 05/24/2022) 90 tablet 3   No facility-administered medications prior to visit.     Per HPI unless specifically indicated in ROS section below Review of Systems  Objective:  BP 132/62   Pulse (!) 58   Temp 97.9 F (36.6 C) (Temporal)   Ht 5' 3.75" (1.619 m)   Wt 175 lb (79.4 kg)   SpO2 96%   BMI 30.27 kg/m   Wt Readings from Last 3 Encounters:  05/24/22 175 lb (79.4 kg)  04/13/22 182 lb (82.6 kg)  03/16/22 192 lb (87.1 kg)      Physical Exam Vitals and nursing note reviewed.  Constitutional:      General: He is not in acute distress.    Appearance: Normal appearance. He is well-developed. He is not ill-appearing.  HENT:     Head: Normocephalic and atraumatic.     Right Ear: Hearing, tympanic membrane, ear canal and external ear normal.     Left Ear: Hearing, tympanic membrane, ear canal and external ear normal.  Eyes:     General: No scleral icterus.    Extraocular Movements: Extraocular movements intact.     Conjunctiva/sclera: Conjunctivae normal.     Pupils: Pupils are equal, round, and reactive to light.  Neck:     Thyroid: No thyroid mass or thyromegaly.  Cardiovascular:     Rate and Rhythm: Normal rate and regular rhythm.     Pulses: Normal pulses.          Radial pulses are 2+ on the right side and 2+ on the left side.     Heart sounds: Normal heart sounds. No murmur heard. Pulmonary:     Effort: Pulmonary effort is normal. No respiratory distress.     Breath sounds: Normal breath sounds. No wheezing, rhonchi or rales.  Abdominal:     General: Bowel sounds are normal. There is no distension.     Palpations: Abdomen is soft. There is no mass.     Tenderness: There is no abdominal  tenderness. There is no guarding or rebound.     Hernia: No hernia is present.  Musculoskeletal:        General: Normal range of motion.     Cervical back: Normal range of motion and neck supple.     Right lower  leg: No edema.     Left lower leg: No edema.  Lymphadenopathy:     Cervical: No cervical adenopathy.  Skin:    General: Skin is warm and dry.     Findings: No rash.  Neurological:     General: No focal deficit present.     Mental Status: He is alert and oriented to person, place, and time.     Comments:  Recall 3/3  Calculation 5/5 serial 7s  Psychiatric:        Mood and Affect: Mood normal.        Behavior: Behavior normal.        Thought Content: Thought content normal.        Judgment: Judgment normal.       Results for orders placed or performed in visit on 05/17/22  PSA, Medicare  Result Value Ref Range   PSA 1.37 0.10 - 4.00 ng/ml  Microalbumin / creatinine urine ratio  Result Value Ref Range   Microalb, Ur 4.3 (H) 0.0 - 1.9 mg/dL   Creatinine,U 185.1 mg/dL   Microalb Creat Ratio 2.3 0.0 - 30.0 mg/g  Hemoglobin A1c  Result Value Ref Range   Hgb A1c MFr Bld 5.9 4.6 - 6.5 %  Comprehensive metabolic panel  Result Value Ref Range   Sodium 139 135 - 145 mEq/L   Potassium 4.6 3.5 - 5.1 mEq/L   Chloride 102 96 - 112 mEq/L   CO2 29 19 - 32 mEq/L   Glucose, Bld 95 70 - 99 mg/dL   BUN 19 6 - 23 mg/dL   Creatinine, Ser 0.99 0.40 - 1.50 mg/dL   Total Bilirubin 1.1 0.2 - 1.2 mg/dL   Alkaline Phosphatase 73 39 - 117 U/L   AST 18 0 - 37 U/L   ALT 14 0 - 53 U/L   Total Protein 6.1 6.0 - 8.3 g/dL   Albumin 4.0 3.5 - 5.2 g/dL   GFR 77.50 >60.00 mL/min   Calcium 9.3 8.4 - 10.5 mg/dL  Lipid panel  Result Value Ref Range   Cholesterol 96 0 - 200 mg/dL   Triglycerides 50.0 0.0 - 149.0 mg/dL   HDL 46.30 >39.00 mg/dL   VLDL 10.0 0.0 - 40.0 mg/dL   LDL Cholesterol 40 0 - 99 mg/dL   Total CHOL/HDL Ratio 2    NonHDL 50.10   VITAMIN D 25 Hydroxy (Vit-D Deficiency,  Fractures)  Result Value Ref Range   VITD 66.25 30.00 - 100.00 ng/mL    Assessment & Plan:   Problem List Items Addressed This Visit     Medicare annual wellness visit, subsequent - Primary (Chronic)    I have personally reviewed the Medicare Annual Wellness questionnaire and have noted 1. The patient's medical and social history 2. Their use of alcohol, tobacco or illicit drugs 3. Their current medications and supplements 4. The patient's functional ability including ADL's, fall risks, home safety risks and hearing or visual impairment. Cognitive function has been assessed and addressed as indicated.  5. Diet and physical activity 6. Evidence for depression or mood disorders The patients weight, height, BMI have been recorded in the chart. I have made referrals, counseling and provided education to the patient based on review of the above and I have provided the pt with a written personalized care plan for preventive services. Provider list updated.. See scanned questionairre as needed for further documentation. Reviewed preventative protocols and updated unless pt declined.       Hyperlipidemia associated with type  2 diabetes mellitus (HCC)    Chronic, great control on atorvastatin 74m daily and with weight loss to date. May do well on lower atorvastatin dose - I asked him to check with cardiology about aspirin and statin dosing at upcoming appointment. The ASCVD Risk score (Arnett DK, et al., 2019) failed to calculate for the following reasons:   The valid total cholesterol range is 130 to 320 mg/dL       Relevant Medications   atorvastatin (LIPITOR) 40 MG tablet   metFORMIN (GLUCOPHAGE) 500 MG tablet   Essential hypertension, benign    Chronic, stable off medication after weight loss. Now off lisinopril.       Relevant Medications   atorvastatin (LIPITOR) 40 MG tablet   Obesity, Class I, BMI 30-34.9    Congratulated on marked weight loss to date, 48 pounds over the past 6  months.  He is motivated to continue healthy diet and lifestyle changes asked up to now.      Controlled diabetes mellitus type 2 with complications (HCC)    Chronic, great control on current regimen of metformin twice daily.  Will drop metformin to once daily dosing after significant weight loss.  Reassess at 658-monthiabetes follow-up.      Relevant Medications   atorvastatin (LIPITOR) 40 MG tablet   metFORMIN (GLUCOPHAGE) 500 MG tablet   Atherosclerosis of aorta (HCC)    Continue aspirin, statin.      Relevant Medications   atorvastatin (LIPITOR) 40 MG tablet   Status post right knee replacement   CAD (coronary artery disease)    Continue aspirin, statin.      Relevant Medications   atorvastatin (LIPITOR) 40 MG tablet   Vitamin D deficiency    Discussed daily vitamin D dosing after he finishes weekly prescription course through bariatric clinic.      Other Visit Diagnoses     Special screening for malignant neoplasms, colon       Relevant Orders   Fecal occult blood, imunochemical        Meds ordered this encounter  Medications   atorvastatin (LIPITOR) 40 MG tablet    Sig: Take 1 tablet (40 mg total) by mouth daily.    Dispense:  90 tablet    Refill:  3   metFORMIN (GLUCOPHAGE) 500 MG tablet    Sig: Take 1 tablet (500 mg total) by mouth daily with breakfast.    Dispense:  90 tablet    Refill:  3   Orders Placed This Encounter  Procedures   Fecal occult blood, imunochemical    Standing Status:   Future    Standing Expiration Date:   05/25/2023     Patient instructions: Congratulations on weight loss to date! Stay off lisinopril - removed from med list.  Drop metformin to 50070mnce daily.  Pass by lab to pick up a stool kit Touch base with cardiology about atorvastatin and aspirin dosing when you see him later this year.  Return in 6 months for diabetes follow up visit.   Follow up plan: Return in about 6 months (around 11/24/2022) for follow up  visit.  JavRia BushD

## 2022-05-24 NOTE — Assessment & Plan Note (Signed)
Discussed daily vitamin D dosing after he finishes weekly prescription course through bariatric clinic.

## 2022-05-24 NOTE — Assessment & Plan Note (Signed)
Continue aspirin, statin.  

## 2022-05-24 NOTE — Assessment & Plan Note (Signed)
Chronic, stable off medication after weight loss. Now off lisinopril.

## 2022-05-24 NOTE — Assessment & Plan Note (Signed)
Chronic, great control on current regimen of metformin twice daily.  Will drop metformin to once daily dosing after significant weight loss.  Reassess at 34-month diabetes follow-up.

## 2022-05-24 NOTE — Patient Instructions (Addendum)
Congratulations on weight loss to date! Stay off lisinopril - removed from med list.  Drop metformin to 531m once daily.  Pass by lab to pick up a stool kit Touch base with cardiology about atorvastatin and aspirin dosing when you see him later this year.  Return in 6 months for diabetes follow up visit.   Health Maintenance After Age 45After age 70 you are at a higher risk for certain long-term diseases and infections as well as injuries from falls. Falls are a major cause of broken bones and head injuries in people who are older than age 70 Getting regular preventive care can help to keep you healthy and well. Preventive care includes getting regular testing and making lifestyle changes as recommended by your health care provider. Talk with your health care provider about: Which screenings and tests you should have. A screening is a test that checks for a disease when you have no symptoms. A diet and exercise plan that is right for you. What should I know about screenings and tests to prevent falls? Screening and testing are the best ways to find a health problem early. Early diagnosis and treatment give you the best chance of managing medical conditions that are common after age 70 Certain conditions and lifestyle choices may make you more likely to have a fall. Your health care provider may recommend: Regular vision checks. Poor vision and conditions such as cataracts can make you more likely to have a fall. If you wear glasses, make sure to get your prescription updated if your vision changes. Medicine review. Work with your health care provider to regularly review all of the medicines you are taking, including over-the-counter medicines. Ask your health care provider about any side effects that may make you more likely to have a fall. Tell your health care provider if any medicines that you take make you feel dizzy or sleepy. Strength and balance checks. Your health care provider may  recommend certain tests to check your strength and balance while standing, walking, or changing positions. Foot health exam. Foot pain and numbness, as well as not wearing proper footwear, can make you more likely to have a fall. Screenings, including: Osteoporosis screening. Osteoporosis is a condition that causes the bones to get weaker and break more easily. Blood pressure screening. Blood pressure changes and medicines to control blood pressure can make you feel dizzy. Depression screening. You may be more likely to have a fall if you have a fear of falling, feel depressed, or feel unable to do activities that you used to do. Alcohol use screening. Using too much alcohol can affect your balance and may make you more likely to have a fall. Follow these instructions at home: Lifestyle Do not drink alcohol if: Your health care provider tells you not to drink. If you drink alcohol: Limit how much you have to: 0-1 drink a day for women. 0-2 drinks a day for men. Know how much alcohol is in your drink. In the [place] one drink equals one 12 oz bottle of beer (355 mL), one 5 oz glass of wine (148 mL), or one 1 oz glass of hard liquor (44 mL). Do not use any products that contain nicotine or tobacco. These products include cigarettes, chewing tobacco, and vaping devices, such as e-cigarettes. If you need help quitting, ask your health care provider. Activity  Follow a regular exercise program to stay fit. This will help you maintain your balance. Ask your health care provider what types  of exercise are appropriate for you. If you need a cane or walker, use it as recommended by your health care provider. Wear supportive shoes that have nonskid soles. Safety  Remove any tripping hazards, such as rugs, cords, and clutter. Install safety equipment such as grab bars in bathrooms and safety rails on stairs. Keep rooms and walkways well-lit. General instructions Talk with your health care provider  about your risks for falling. Tell your health care provider if: You fall. Be sure to tell your health care provider about all falls, even ones that seem minor. You feel dizzy, tiredness (fatigue), or off-balance. Take over-the-counter and prescription medicines only as told by your health care provider. These include supplements. Eat a healthy diet and maintain a healthy weight. A healthy diet includes low-fat dairy products, low-fat (lean) meats, and fiber from whole grains, beans, and lots of fruits and vegetables. Stay current with your vaccines. Schedule regular health, dental, and eye exams. Summary Having a healthy lifestyle and getting preventive care can help to protect your health and wellness after age 80. Screening and testing are the best way to find a health problem early and help you avoid having a fall. Early diagnosis and treatment give you the best chance for managing medical conditions that are more common for people who are older than age 37. Falls are a major cause of broken bones and head injuries in people who are older than age 42. Take precautions to prevent a fall at home. Work with your health care provider to learn what changes you can make to improve your health and wellness and to prevent falls. This information is not intended to replace advice given to you by your health care provider. Make sure you discuss any questions you have with your health care provider. Document Revised: 02/02/2021 Document Reviewed: 02/02/2021 Elsevier Patient Education  [place].

## 2022-05-24 NOTE — Assessment & Plan Note (Signed)
Chronic, great control on atorvastatin 40mg  daily and with weight loss to date. May do well on lower atorvastatin dose - I asked him to check with cardiology about aspirin and statin dosing at upcoming appointment. The ASCVD Risk score (Arnett DK, et al., 2019) failed to calculate for the following reasons:   The valid total cholesterol range is 130 to 320 mg/dL

## 2022-05-24 NOTE — Assessment & Plan Note (Signed)
Congratulated on marked weight loss to date, 48 pounds over the past 6 months.  He is motivated to continue healthy diet and lifestyle changes asked up to now.

## 2022-05-24 NOTE — Assessment & Plan Note (Signed)

## 2022-05-25 ENCOUNTER — Ambulatory Visit: Payer: Medicare Other

## 2022-06-01 ENCOUNTER — Ambulatory Visit (INDEPENDENT_AMBULATORY_CARE_PROVIDER_SITE_OTHER): Payer: Medicare Other | Admitting: Adult Health

## 2022-06-01 ENCOUNTER — Encounter (INDEPENDENT_AMBULATORY_CARE_PROVIDER_SITE_OTHER): Payer: Self-pay | Admitting: Adult Health

## 2022-06-01 ENCOUNTER — Other Ambulatory Visit (INDEPENDENT_AMBULATORY_CARE_PROVIDER_SITE_OTHER): Payer: Medicare Other

## 2022-06-01 VITALS — BP 128/74 | HR 68 | Temp 97.4°F | Ht 63.75 in | Wt 170.0 lb

## 2022-06-01 DIAGNOSIS — I152 Hypertension secondary to endocrine disorders: Secondary | ICD-10-CM

## 2022-06-01 DIAGNOSIS — Z6829 Body mass index (BMI) 29.0-29.9, adult: Secondary | ICD-10-CM

## 2022-06-01 DIAGNOSIS — Z7984 Long term (current) use of oral hypoglycemic drugs: Secondary | ICD-10-CM

## 2022-06-01 DIAGNOSIS — E669 Obesity, unspecified: Secondary | ICD-10-CM

## 2022-06-01 DIAGNOSIS — E559 Vitamin D deficiency, unspecified: Secondary | ICD-10-CM | POA: Diagnosis not present

## 2022-06-01 DIAGNOSIS — Z1211 Encounter for screening for malignant neoplasm of colon: Secondary | ICD-10-CM

## 2022-06-01 DIAGNOSIS — E1169 Type 2 diabetes mellitus with other specified complication: Secondary | ICD-10-CM | POA: Diagnosis not present

## 2022-06-01 DIAGNOSIS — E1159 Type 2 diabetes mellitus with other circulatory complications: Secondary | ICD-10-CM | POA: Diagnosis not present

## 2022-06-02 LAB — FECAL OCCULT BLOOD, IMMUNOCHEMICAL: Fecal Occult Bld: NEGATIVE

## 2022-06-05 NOTE — Progress Notes (Unsigned)
Chief Complaint:   OBESITY Roger Mcdaniel is here to discuss his progress with his obesity treatment plan along with follow-up of his obesity related diagnoses. Zubin is on the Category 3 Plan and states he is following his eating plan approximately 75% of the time. Coley states he is yardwork, walking, going to the [employer]  15-30 minutes 2-4 times per week.  Today's visit was #: 7 Starting weight: 221 Starting date: 01/05/2022 Today's weight: 170 lbs Today's date: 06/01/2022 Total lbs lost to date: 51 lbs Total lbs lost since last in-office visit: 12 lbs  Interim History: Only 7th visit and is down 51 lbs.  Current BMI 29.3, weight 170 lbs.  When he entered the [employer] at age [age] *** [employer].  PCP ***  Subjective:   1. Hypertension associated with type 2 diabetes mellitus (HCC) Due to blood pressure decreased, PCP instructed him to hold lisinopril 10 mg 3 weeks ago.  Home readings after ACE stopped.  SBP 100-110  2. Vitamin D deficiency 05/17/2022 Vitamin D level 66.2  3. Diabetes mellitus type 2 in obese (HCC) PCP decreased Metformin 500 mg BID to daily.  05/17/2022 A1c 5.9, at goal.   Assessment/Plan:   1. Hypertension associated with type 2 diabetes mellitus (HCC) Continue to monitor ambulatory readings.   2. Vitamin D deficiency Convert from Ergocalciferol to OTC Vitamin D-3, 2,000 IU daily.   3. Diabetes mellitus type 2 in obese (HCC) Continue daily metformin 500 mg daily per PCP.   4. Obesity, Current BMI 29.3 IC next office visit.  Patient is aware to arrive 30 minutes early and fasting.   Azad is currently in the action stage of change. As such, his goal is to continue with weight loss efforts. He has agreed to the Category 3 Plan.   Exercise goals:  as is.    Behavioral modification strategies: increasing lean protein intake, decreasing simple carbohydrates, meal planning and cooking strategies, keeping healthy foods in the home, and planning for  success.  Detric has agreed to follow-up with our clinic in 3 weeks. He was informed of the importance of frequent follow-up visits to maximize his success with intensive lifestyle modifications for his multiple health conditions.   Objective:   Blood pressure 128/74, pulse 68, temperature (!) 97.4 F (36.3 C), height 5' 3.75" (1.619 m), weight 170 lb (77.1 kg), SpO2 98 %. Body mass index is 29.41 kg/m.  General: Cooperative, alert, well developed, in no acute distress. HEENT: Conjunctivae and lids unremarkable. Cardiovascular: Regular rhythm.  Lungs: Normal work of breathing. Neurologic: No focal deficits.   Lab Results  Component Value Date   CREATININE 0.99 05/17/2022   BUN 19 05/17/2022   NA 139 05/17/2022   K 4.6 05/17/2022   CL 102 05/17/2022   CO2 29 05/17/2022   Lab Results  Component Value Date   ALT 14 05/17/2022   AST 18 05/17/2022   ALKPHOS 73 05/17/2022   BILITOT 1.1 05/17/2022   Lab Results  Component Value Date   HGBA1C 5.9 05/17/2022   HGBA1C 6.4 (H) 01/05/2022   HGBA1C 6.4 09/29/2021   HGBA1C 6.4 05/18/2021   HGBA1C 6.4 (A) 11/14/2020   Lab Results  Component Value Date   INSULIN 10.1 01/05/2022   Lab Results  Component Value Date   TSH 2.200 01/05/2022   Lab Results  Component Value Date   CHOL 96 05/17/2022   HDL 46.30 05/17/2022   LDLCALC 40 05/17/2022   LDLDIRECT 143.5 04/27/2011  TRIG 50.0 05/17/2022   CHOLHDL 2 05/17/2022   Lab Results  Component Value Date   VD25OH 66.25 05/17/2022   VD25OH 18.0 (L) 01/05/2022   Lab Results  Component Value Date   WBC 7.4 11/05/2021   HGB 14.6 11/05/2021   HCT 45.1 11/05/2021   MCV 91.9 11/05/2021   PLT 256 11/05/2021   No results found for: "IRON", "TIBC", "FERRITIN"  Obesity Behavioral Intervention:   Approximately 15 minutes were spent on the discussion below.  ASK: We discussed the diagnosis of obesity with Braycen today and Moise agreed to give [place] permission to discuss obesity  behavioral modification therapy today.  ASSESS: Kullen has the diagnosis of obesity and his BMI today is 29.3. Jerami is in the action stage of change.   ADVISE: Joesph was educated on the multiple health risks of obesity as well as the benefit of weight loss to improve his health. He was advised of the need for long term treatment and the importance of lifestyle modifications to improve his current health and to decrease his risk of future health problems.  AGREE: Multiple dietary modification options and treatment options were discussed and Xhaiden agreed to follow the recommendations documented in the above note.  ARRANGE: Demetri was educated on the importance of frequent visits to treat obesity as outlined per CMS and USPSTF guidelines and agreed to schedule his next follow up appointment today.  Attestation Statements:   Reviewed by clinician on day of visit: allergies, medications, problem list, medical history, surgical history, family history, social history, and previous encounter notes.  Time spent on visit including pre-visit chart review and post-visit care and charting was 28 minutes.   I, Malcolm Metro, RMA, am acting as Energy manager for William Hamburger, NP.  I have reviewed the above documentation for accuracy and completeness, and I agree with the above. -  ***

## 2022-06-08 ENCOUNTER — Other Ambulatory Visit: Payer: Self-pay | Admitting: Family Medicine

## 2022-06-25 ENCOUNTER — Encounter: Payer: Self-pay | Admitting: Family Medicine

## 2022-06-25 DIAGNOSIS — Z23 Encounter for immunization: Secondary | ICD-10-CM | POA: Diagnosis not present

## 2022-06-25 NOTE — Telephone Encounter (Signed)
Updated pt's immunizations and med list.

## 2022-06-29 ENCOUNTER — Ambulatory Visit (INDEPENDENT_AMBULATORY_CARE_PROVIDER_SITE_OTHER): Payer: Medicare Other | Admitting: Adult Health

## 2022-06-29 ENCOUNTER — Encounter (INDEPENDENT_AMBULATORY_CARE_PROVIDER_SITE_OTHER): Payer: Self-pay | Admitting: Adult Health

## 2022-06-29 ENCOUNTER — Other Ambulatory Visit (INDEPENDENT_AMBULATORY_CARE_PROVIDER_SITE_OTHER): Payer: Self-pay | Admitting: Adult Health

## 2022-06-29 VITALS — BP 147/67 | HR 54 | Temp 97.7°F | Ht 63.75 in | Wt 171.0 lb

## 2022-06-29 DIAGNOSIS — E1169 Type 2 diabetes mellitus with other specified complication: Secondary | ICD-10-CM

## 2022-06-29 DIAGNOSIS — E559 Vitamin D deficiency, unspecified: Secondary | ICD-10-CM | POA: Diagnosis not present

## 2022-06-29 DIAGNOSIS — Z6830 Body mass index (BMI) 30.0-30.9, adult: Secondary | ICD-10-CM

## 2022-06-29 DIAGNOSIS — I152 Hypertension secondary to endocrine disorders: Secondary | ICD-10-CM

## 2022-06-29 DIAGNOSIS — R0602 Shortness of breath: Secondary | ICD-10-CM | POA: Diagnosis not present

## 2022-06-29 DIAGNOSIS — E669 Obesity, unspecified: Secondary | ICD-10-CM

## 2022-06-29 DIAGNOSIS — E1159 Type 2 diabetes mellitus with other circulatory complications: Secondary | ICD-10-CM | POA: Diagnosis not present

## 2022-06-29 DIAGNOSIS — Z7984 Long term (current) use of oral hypoglycemic drugs: Secondary | ICD-10-CM

## 2022-06-30 LAB — INSULIN, RANDOM: INSULIN: 4.8 u[IU]/mL (ref 2.6–24.9)

## 2022-06-30 LAB — VITAMIN B12: Vitamin B-12: 328 pg/mL (ref 232–1245)

## 2022-06-30 NOTE — Progress Notes (Unsigned)
Chief Complaint:   OBESITY Roger Mcdaniel is here to discuss his progress with his obesity treatment plan along with follow-up of his obesity related diagnoses. Roger Mcdaniel is on the Category 3 Plan and states he is following his eating plan approximately 75% of the time. Roger Mcdaniel states he is [employer] and walking 30+ minutes 2 times per week.  Today's visit was #: 8 Starting weight: 221 lbs Starting date: 01/05/2022 Today's weight: 171 lbs Today's date: 06/29/2022 Total lbs lost to date: 50 lbs Total lbs lost since last in-office visit: +1 lb  Interim History: 01/05/2022, RMR 2117, 06/29/2022 RMR 1728.  Metabolism has decreased by 389 calories/day.  Two surprise parties to celebrate his 71th birthday.  Final goal is to decrease visceral rating (18)  Subjective:   1. SOB (shortness of breath) on exertion ***  2. Hypertension associated with type 2 diabetes mellitus ([place]) Home blood pressure readings SBP 100-120, DBP 60-70.  05/04/2022 PCP recently discontinued lisinopril 10 mg daily.   3. Vitamin D deficiency 05/17/2022, Vitamin D level 66.25 at goal. He is currently taking OTC Vitamin D-3.   4. Diabetes mellitus type 2 in obese [facility]) He is only taking metformin 500 mg daily.  05/17/2022, A1c 5.9 at goal.   Assessment/Plan:   1. SOB (shortness of breath) on exertion Check IC today.   2. Hypertension associated with type 2 diabetes mellitus ([place]) Continue to closely monitor blood pressure and follow up with PCP.   3. Vitamin D deficiency Continue OTC supplementation.   4. Diabetes mellitus type 2 in obese [facility]) Check labs today.   - Insulin, random - Vitamin B12  5. Obesity, Current BMI 30.3 Convert from Category 3 to Category 2 to account for reduced metabolism.  Roger Mcdaniel is currently in the action stage of change. As such, his goal is to continue with weight loss efforts. He has agreed to the Category 2 Plan.   Exercise goals:  As is.   Behavioral modification strategies:  increasing lean protein intake, decreasing simple carbohydrates, meal planning and cooking strategies, keeping healthy foods in the home, and planning for success.  Roger Mcdaniel has agreed to follow-up with our clinic in 3-4 weeks. He was informed of the importance of frequent follow-up visits to maximize his success with intensive lifestyle modifications for his multiple health conditions.   Roger Mcdaniel was informed we would discuss his lab results at his next visit unless there is a critical issue that needs to be addressed sooner. Roger Mcdaniel agreed to keep his next visit at the agreed upon time to discuss these results.  Objective:   Blood pressure (!) 147/67, pulse (!) 54, temperature 97.7 F (36.5 C), height 5' 3.75" (1.619 m), weight 171 lb (77.6 kg), SpO2 99 %. Body mass index is 29.58 kg/m.  General: Cooperative, alert, well developed, in no acute distress. HEENT: Conjunctivae and lids unremarkable. Cardiovascular: Regular rhythm.  Lungs: Normal work of breathing. Neurologic: No focal deficits.   Lab Results  Component Value Date   CREATININE 0.99 05/17/2022   BUN 19 05/17/2022   NA 139 05/17/2022   K 4.6 05/17/2022   CL 102 05/17/2022   CO2 29 05/17/2022   Lab Results  Component Value Date   ALT 14 05/17/2022   AST 18 05/17/2022   ALKPHOS 73 05/17/2022   BILITOT 1.1 05/17/2022   Lab Results  Component Value Date   HGBA1C 5.9 05/17/2022   HGBA1C 6.4 (H) 01/05/2022   HGBA1C 6.4 09/29/2021   HGBA1C 6.4 05/18/2021  HGBA1C 6.4 (A) 11/14/2020   Lab Results  Component Value Date   INSULIN 4.8 06/29/2022   INSULIN 10.1 01/05/2022   Lab Results  Component Value Date   TSH 2.200 01/05/2022   Lab Results  Component Value Date   CHOL 96 05/17/2022   HDL 46.30 05/17/2022   LDLCALC 40 05/17/2022   LDLDIRECT 143.5 04/27/2011   TRIG 50.0 05/17/2022   CHOLHDL 2 05/17/2022   Lab Results  Component Value Date   VD25OH 66.25 05/17/2022   VD25OH 18.0 (L) 01/05/2022   Lab Results   Component Value Date   WBC 7.4 11/05/2021   HGB 14.6 11/05/2021   HCT 45.1 11/05/2021   MCV 91.9 11/05/2021   PLT 256 11/05/2021   No results found for: "IRON", "TIBC", "FERRITIN"  Obesity Behavioral Intervention:   Approximately 15 minutes were spent on the discussion below.  ASK: We discussed the diagnosis of obesity with Roger Mcdaniel today and Roger Mcdaniel agreed to give [place] permission to discuss obesity behavioral modification therapy today.  ASSESS: Roger Mcdaniel has the diagnosis of obesity and his BMI today is 30.3. Roger Mcdaniel is in the action stage of change.   ADVISE: Roger Mcdaniel was educated on the multiple health risks of obesity as well as the benefit of weight loss to improve his health. He was advised of the need for long term treatment and the importance of lifestyle modifications to improve his current health and to decrease his risk of future health problems.  AGREE: Multiple dietary modification options and treatment options were discussed and Roger Mcdaniel agreed to follow the recommendations documented in the above note.  ARRANGE: Roger Mcdaniel was educated on the importance of frequent visits to treat obesity as outlined per CMS and USPSTF guidelines and agreed to schedule his next follow up appointment today.  Attestation Statements:   Reviewed by clinician on day of visit: allergies, medications, problem list, medical history, surgical history, family history, social history, and previous encounter notes.  I, Davy Pique, RMA, am acting as Location manager for Mina Marble, NP.  I have reviewed the above documentation for accuracy and completeness, and I agree with the above. -  ***

## 2022-07-12 ENCOUNTER — Ambulatory Visit (INDEPENDENT_AMBULATORY_CARE_PROVIDER_SITE_OTHER): Payer: Medicare Other | Admitting: Pulmonary Disease

## 2022-07-12 ENCOUNTER — Encounter: Payer: Self-pay | Admitting: Pulmonary Disease

## 2022-07-12 VITALS — BP 124/76 | HR 61 | Temp 98.0°F | Ht 64.0 in | Wt 177.8 lb

## 2022-07-12 DIAGNOSIS — J9811 Atelectasis: Secondary | ICD-10-CM

## 2022-07-12 NOTE — Patient Instructions (Signed)
CT chest without contrast to follow-up on left lower lobe atelectasis -We will let you know what the CT scan shows  Continue graded activities as tolerated  Follow-up a year from now

## 2022-07-12 NOTE — Progress Notes (Signed)
Roger Mcdaniel    [id number]    [birth year]  Primary Care Physician:Gutierrez, Garlon Hatchet, MD  Referring Physician: Ria Bush, MD [address] [place],  [place] [address]  Chief complaint:   In for follow-up for abnormal CT showing atelectasis left lower lobe  HPI:  Recent CT shows atelectasis left lower lobe in March 2023  Did have a bronchoscopy that did not show any endobronchial lesions Repeat CT shows stable findings  PFT shows reversible airway obstruction with mild  He has lost about 50 pounds since the last visit Has been more active Does not get short of breath with moderate activity Occasional shortness of breath in the evening, not associated with activity  He had had a chest injury in November 2022, fell and injured his chest left him with a bruise.  Did not have any discomfort apart from just bruising on his chest.  A few weeks later did have some chest pain and he went to the hospital to get evaluated.  He had a CT scan of his chest showing atelectasis at the base.  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  He has hypertension, diabetes, hypercholesterolemia  Outpatient Encounter Medications as of 07/12/2022  Medication Sig   Ascorbic Acid (VITAMIN C) 1000 MG tablet Take 1,000 mg by mouth daily. Every morning   aspirin EC 81 MG tablet Take 1 tablet (81 mg total) by mouth daily. Swallow whole.   atorvastatin (LIPITOR) 40 MG tablet Take 1 tablet (40 mg total) by mouth daily.   Cholecalciferol (VITAMIN D3) 50 MCG (2000 UT) TABS Take 1 tablet by mouth daily. Every morning   Lancets (ONETOUCH DELICA PLUS [id number]) MISC CHECK SUGAR ONCE DAILY DX E11.9   Magnesium 250 MG TABS Take 250 mg by mouth  daily.   metFORMIN (GLUCOPHAGE) 500 MG tablet Take 1 tablet (500 mg total) by mouth daily with breakfast.   ONETOUCH ULTRA test strip CHECK BLOOD SUGAR EVERY DAY   zinc gluconate 50 MG tablet Take 50 mg by mouth daily.   No facility-administered encounter medications on file as of 07/12/2022.    Allergies as of 07/12/2022 - Review Complete 07/12/2022  Allergen Reaction Noted   Codeine sulfate Nausea Only 07/10/2008   Pravachol [pravastatin sodium] Other (See Comments) 07/19/2012    Past Medical History:  Diagnosis Date   Back pain    Bruises easily    Chest pain    CMC arthritis, thumb, degenerative    Collapse of left lung    DDD (degenerative disc disease), cervical 2015   s/o ACDF ([place])   DM type 2 (diabetes mellitus, type 2) ([place])    Dry skin    Edema, lower extremity    GERD 07/10/2008   Qualifier: Diagnosis of  By: Council Mechanic MD, Hilaria Ota    GERD (gastroesophageal reflux disease) 09/2003   History of ETT 05/23/2000   Nml (Dr. Acie Fredrickson)   History of kidney stones    HNP (herniated nucleus pulposus), cervical 12/20/2013   Hyperlipidemia 09/2003   Hypertension 06/2004   Joint pain    Obesity    OSA (obstructive sleep apnea)    was on CPAP,  not used  in 25-30 years    Pre-diabetes    SOB (shortness of breath)    Swallowing difficulty     Past Surgical History:  Procedure Laterality Date   ANTERIOR CERVICAL DECOMP/DISCECTOMY FUSION N/A 01/14/2014   Procedure: ANTERIOR CERVICAL DECOMPRESSION/DISCECTOMY FUSION CERVICAL FOUR-FIVE,CERVICAL FIVE-SIX ;  Surgeon: Hosie Spangle, MD   BRONCHIAL WASHINGS Left 11/05/2021   Procedure: BRONCHIAL WASHINGS;  Surgeon: Garner Nash, DO;  Location: [facility];  Service: Pulmonary;  Laterality: Left;   CATARACT EXTRACTION Right 02/28/2019   Per patient   CATARACT EXTRACTION Left 03/12/2019   Per patient   CYSTOSCOPY/URETEROSCOPY/HOLMIUM LASER/STENT PLACEMENT Right 05/28/2020   Procedure: CYSTOSCOPY RIGHT  URETEROSCOPY/HOLMIUM LASER/STENT PLACEMENT STONE BASKET RETRACTION;  Surgeon: Lucas Mallow, MD;  Location: [facility];  Service: Urology;  Laterality: Right;   EXTRACORPOREAL SHOCK WAVE LITHOTRIPSY Right 03/17/2020   Procedure: RIGHT EXTRACORPOREAL SHOCK WAVE LITHOTRIPSY (ESWL);  Surgeon: Ceasar Mons, MD;  Location: [facility];  Service: Urology;  Laterality: Right;   FLEXIBLE BRONCHOSCOPY  10/2021   Flexible video fiberoptic bronchoscopy and biopsies for partially collapsed L lung ([facility])   KNEE ARTHROPLASTY Right 11/27/2020   Procedure: COMPUTER ASSISTED TOTAL KNEE ARTHROPLASTY;  Surgeon: Rod Can, MD;  Location: [facility];  Service: Orthopedics;  Laterality: Right;   ORIF ANKLE FRACTURE  1980   right   RETINAL LASER PROCEDURE Right 03/14/2019   Per patient   SEPTOPLASTY  2005   for sleep apnea //cpap   SHOULDER SURGERY Right 06/2017   Dr Onnie Graham at [facility] Bilateral 11/05/2021   Procedure: VIDEO BRONCHOSCOPY WITHOUT FLUORO;  Surgeon: Garner Nash, DO;  Location: [facility];  Service: Pulmonary;  Laterality: Bilateral;  Cytology not needed   WRIST ARTHROSCOPY WITH CARPOMETACARPEL (CMC) ARTHROPLASTY Right 11/2018   R CMC and R MCP arthroplasty ([place])    Family History  Problem Relation Age of Onset   CVA Mother    Cancer Mother    Heart disease Father        MI x 2   Hypertension Brother    Obesity Brother    Heart disease Paternal Grandfather         MI   Cancer Other     Social History   Socioeconomic History   Marital status: Widowed    Spouse name: Not on file   Number of children: Not on file   Years of education: Not on file   Highest education level: Not on file  Occupational History   Occupation: Data processing manager    Comment: Primary school teacher   Occupation: semi retired  Tobacco Use   Smoking status: Never   Smokeless tobacco: Never  Scientific laboratory technician Use: Never used   Substance and Sexual Activity   Alcohol use: Not Currently    Alcohol/week: 1.0 standard drink of alcohol    Types: 1 Standard drinks or equivalent per week   Drug use: No   Sexual activity: Yes  Other Topics Concern   Not on file  Social History Narrative   Married. Widower - wife passed after leg surgery 11/2014   Children: 2 out of house   Occ: retired, was Software engineer of [employer]   Activity: no regular exercise   Social Determinants of Radio broadcast assistant Strain: Low Risk  (04/04/2020)   Overall Financial Resource Strain (CARDIA)    Difficulty of Paying Living Expenses: Not hard at all  Food Insecurity: No Food Insecurity (04/04/2020)   Hunger Vital Sign    Worried About Running Out of Food in the Last Year: Never true    Ran Out of Food in the Last Year: Never true  Transportation Needs: No Transportation Needs (04/04/2020)   PRAPARE - Hydrologist (Medical): No    Lack of Transportation (Non-Medical): No  Physical Activity: Inactive (04/04/2020)   Exercise Vital Sign    Days of Exercise per Week: 0 days    Minutes of Exercise per Session: 0 min  Stress: No Stress Concern Present (04/04/2020)   [place]    Feeling of Stress : Not at all  Social Connections: Not on file  Intimate Partner Violence: Not At Risk (04/04/2020)   Humiliation, Afraid, Rape, and Kick questionnaire    Fear of Current or Ex-Partner: No    Emotionally Abused: No    Physically Abused: No    Sexually Abused: No    Review of Systems  Constitutional:  Negative for fatigue.  Respiratory:  Negative for cough and shortness of breath.   Psychiatric/Behavioral:  Positive for sleep disturbance.     Vitals:   07/12/22 0835  BP: 124/76  Pulse: 61  Temp: 98 F (36.7 C)  SpO2: 100%     Physical Exam Constitutional:      Appearance: He is obese.  HENT:     Head: Normocephalic.     Mouth/Throat:      Mouth: Mucous membranes are moist.  Eyes:     Pupils: Pupils are equal, round, and reactive to light.  Cardiovascular:     Rate and Rhythm: Normal rate and regular rhythm.     Heart sounds: No murmur heard.    No friction rub.  Pulmonary:     Effort: No respiratory distress.     Breath sounds: No stridor. No wheezing or rhonchi.  Musculoskeletal:     Cervical back: No rigidity or tenderness.  Neurological:     Mental Status: He is alert.  Psychiatric:        Mood and Affect: Mood normal.    Data Reviewed: CT scan 10/06/2021 was reviewed with the patient showing atelectasis left base, and had a CT scan of his abdomen 03/07/2020 that she did not reveal atelectasis at the lung base  Most recent CT scan of the chest on 12/17/2021 reviewed with the patient showing stable findings-atelectasis left lower lobe  Pulmonary function test 01/11/2022-no obstruction, no restriction, there was 14% improvement with albuterol use-suggestive of hyperactivity  Assessment:  Atelectasis -Left base -No endobronchial lesion Has no significant symptoms at present -I think it is prudent to repeat the CT scan 1 more time to ensure stability and no evolution or presence of endobronchial lesion  Shortness of breath that he had initially -Breathing is much better  Has had significant weight loss since her last visit with effort -Increase activity -Was going to the weight loss program  Patient never smoked, no weight loss, no personal history of cancer  Plan/Recommendations: Repeat CT scan of the chest without contrast to evaluate atelectasis left lower lobe  Follow-up in a year  Encouraged to stay active  Congratulated about weight loss efforts  Sherrilyn Rist MD [facility] 07/12/2022, 8:41 AM  CC: Ria Bush, MD

## 2022-07-27 ENCOUNTER — Ambulatory Visit (INDEPENDENT_AMBULATORY_CARE_PROVIDER_SITE_OTHER): Payer: Medicare Other | Admitting: Adult Health

## 2022-07-27 ENCOUNTER — Encounter (INDEPENDENT_AMBULATORY_CARE_PROVIDER_SITE_OTHER): Payer: Self-pay | Admitting: Adult Health

## 2022-07-27 VITALS — BP 118/66 | HR 75 | Temp 97.7°F | Ht 64.0 in | Wt 168.0 lb

## 2022-07-27 DIAGNOSIS — Z6828 Body mass index (BMI) 28.0-28.9, adult: Secondary | ICD-10-CM | POA: Diagnosis not present

## 2022-07-27 DIAGNOSIS — E1159 Type 2 diabetes mellitus with other circulatory complications: Secondary | ICD-10-CM

## 2022-07-27 DIAGNOSIS — Z7984 Long term (current) use of oral hypoglycemic drugs: Secondary | ICD-10-CM | POA: Diagnosis not present

## 2022-07-27 DIAGNOSIS — E669 Obesity, unspecified: Secondary | ICD-10-CM

## 2022-07-27 DIAGNOSIS — E118 Type 2 diabetes mellitus with unspecified complications: Secondary | ICD-10-CM

## 2022-07-27 DIAGNOSIS — I152 Hypertension secondary to endocrine disorders: Secondary | ICD-10-CM | POA: Diagnosis not present

## 2022-07-29 NOTE — Progress Notes (Addendum)
Chief Complaint:   OBESITY Roger Mcdaniel is here to discuss his progress with his obesity treatment plan along with follow-up of his obesity related diagnoses. Roger Mcdaniel is on the Category 2 Plan and states he is following his eating plan approximately 75% of the time. Roger Mcdaniel states he is going to [employer], biking and yard work 5 times per week.  Today's visit was #: 9 Starting weight: 221 lbs Starting date: 01/05/2022 Today's weight: 168 lbs Today's date: 07/27/2022 Total lbs lost to date: 53 lbs Total lbs lost since last in-office visit: 3 lbs  Interim History:  Roger Mcdaniel reports is finished cutting yard for the season.  Now he is working on [employer] .  4 miles of steps yesterday during yard work.  His height was retaken he is 5'4"- updated in system from 5'3" His wife's birthday passed, she would of been 83, 07/18/2022.  She passed 01/25/2015.   Of Note: He has achieved Maintenance Phase in 9 visits at [facility]. Lisinopril d/c'd Metformin 500 mg decreased from BID to daily.   Subjective:   1. Hypertension associated with type 2 diabetes mellitus ([place]) 05/04/2022, PCP discontinued lisinopril 10 mg.   Blood pressure remains at goal.    2. Controlled type 2 diabetes mellitus with complication, without long-term current use of insulin (H[facility]) Lab Results  Component Value Date   HGBA1C 5.9 05/17/2022   HGBA1C 6.4 (H) 01/05/2022   HGBA1C 6.4 09/29/2021    06/29/2022, Insulin level 4.8, B12 328.   PCP manages metformin 500 mg at breakfast.   Assessment/Plan:   1. Hypertension associated with type 2 diabetes mellitus ([place]) Continue heart healthy eating and regular exercise.   2. Controlled type 2 diabetes mellitus with complication, without long-term current use of insulin (HCC) Continue metformin per PCP.   3. Obesity, Current BMI 28.9 Maintenance phase!  Handouts:  Thanksgiving strategies, Holiday recipes.    Roger Mcdaniel is currently in the action stage of change. As such, his goal  is to continue with weight loss efforts. He has agreed to the Category 2 Plan.   Exercise goals:  As is, and walking his dog Roger Mcdaniel.   Behavioral modification strategies: increasing lean protein intake, decreasing simple carbohydrates, meal planning and cooking strategies, keeping healthy foods in the home, and planning for success.  Roger Mcdaniel has agreed to follow-up with our clinic in 8 weeks. He was informed of the importance of frequent follow-up visits to maximize his success with intensive lifestyle modifications for his multiple health conditions.   Objective:   Blood pressure 118/66, pulse 75, temperature 97.7 F (36.5 C), height 5' 4"$  (1.626 m), weight 168 lb (76.2 kg), SpO2 92 %. Body mass index is 28.84 kg/m.  General: Cooperative, alert, well developed, in no acute distress. HEENT: Conjunctivae and lids unremarkable. Cardiovascular: Regular rhythm.  Lungs: Normal work of breathing. Neurologic: No focal deficits.   Lab Results  Component Value Date   CREATININE 0.99 05/17/2022   BUN 19 05/17/2022   NA 139 05/17/2022   K 4.6 05/17/2022   CL 102 05/17/2022   CO2 29 05/17/2022   Lab Results  Component Value Date   ALT 14 05/17/2022   AST 18 05/17/2022   ALKPHOS 73 05/17/2022   BILITOT 1.1 05/17/2022   Lab Results  Component Value Date   HGBA1C 5.9 05/17/2022   HGBA1C 6.4 (H) 01/05/2022   HGBA1C 6.4 09/29/2021   HGBA1C 6.4 05/18/2021   HGBA1C 6.4 (A) 11/14/2020   Lab Results  Component  Value Date   INSULIN 4.8 06/29/2022   INSULIN 10.1 01/05/2022   Lab Results  Component Value Date   TSH 2.200 01/05/2022   Lab Results  Component Value Date   CHOL 96 05/17/2022   HDL 46.30 05/17/2022   LDLCALC 40 05/17/2022   LDLDIRECT 143.5 04/27/2011   TRIG 50.0 05/17/2022   CHOLHDL 2 05/17/2022   Lab Results  Component Value Date   VD25OH 66.25 05/17/2022   VD25OH 18.0 (L) 01/05/2022   Lab Results  Component Value Date   WBC 7.4 11/05/2021   HGB 14.6  11/05/2021   HCT 45.1 11/05/2021   MCV 91.9 11/05/2021   PLT 256 11/05/2021   No results found for: "IRON", "TIBC", "FERRITIN"  Attestation Statements:   Reviewed by clinician on day of visit: allergies, medications, problem list, medical history, surgical history, family history, social history, and previous encounter notes.  I, Davy Pique, RMA, am acting as Location manager for Mina Marble, NP.  I have reviewed the above documentation for accuracy and completeness, and I agree with the above. -  Clive Parcel d. Rashawna Scoles, NP-C

## 2022-08-12 ENCOUNTER — Ambulatory Visit
Admission: RE | Admit: 2022-08-12 | Discharge: 2022-08-12 | Disposition: A | Discharge status: Home / Self Care | Payer: Medicare Other | Source: Ambulatory Visit | Attending: Pulmonary Disease | Admitting: Pulmonary Disease

## 2022-08-12 DIAGNOSIS — I700 Atherosclerosis of aorta: Secondary | ICD-10-CM | POA: Diagnosis not present

## 2022-08-12 DIAGNOSIS — J9811 Atelectasis: Secondary | ICD-10-CM

## 2022-08-13 ENCOUNTER — Encounter: Payer: Self-pay | Admitting: Family Medicine

## 2022-08-23 DIAGNOSIS — M13849 Other specified arthritis, unspecified hand: Secondary | ICD-10-CM | POA: Diagnosis not present

## 2022-08-23 DIAGNOSIS — M65341 Trigger finger, right ring finger: Secondary | ICD-10-CM | POA: Diagnosis not present

## 2022-08-23 DIAGNOSIS — M65342 Trigger finger, left ring finger: Secondary | ICD-10-CM | POA: Diagnosis not present

## 2022-08-23 DIAGNOSIS — R52 Pain, unspecified: Secondary | ICD-10-CM | POA: Diagnosis not present

## 2022-09-14 ENCOUNTER — Ambulatory Visit: Payer: Medicare Other | Admitting: Cardiovascular Disease

## 2022-09-28 ENCOUNTER — Ambulatory Visit (INDEPENDENT_AMBULATORY_CARE_PROVIDER_SITE_OTHER): Payer: Medicare Other | Admitting: Adult Health

## 2022-10-03 ENCOUNTER — Other Ambulatory Visit: Payer: Self-pay | Admitting: Family Medicine

## 2022-10-03 ENCOUNTER — Encounter: Payer: Self-pay | Admitting: Family Medicine

## 2022-10-04 ENCOUNTER — Ambulatory Visit (INDEPENDENT_AMBULATORY_CARE_PROVIDER_SITE_OTHER)
Admission: RE | Admit: 2022-10-04 | Discharge: 2022-10-04 | Disposition: A | Discharge status: Home / Self Care | Payer: Medicare Other | Source: Ambulatory Visit | Attending: Family Medicine | Admitting: Family Medicine

## 2022-10-04 ENCOUNTER — Encounter: Payer: Self-pay | Admitting: Family Medicine

## 2022-10-04 ENCOUNTER — Ambulatory Visit (INDEPENDENT_AMBULATORY_CARE_PROVIDER_SITE_OTHER): Payer: Medicare Other | Admitting: Family Medicine

## 2022-10-04 VITALS — BP 146/82 | HR 66 | Temp 98.2°F | Ht 64.0 in | Wt 188.5 lb

## 2022-10-04 DIAGNOSIS — R300 Dysuria: Secondary | ICD-10-CM

## 2022-10-04 DIAGNOSIS — M545 Low back pain, unspecified: Secondary | ICD-10-CM

## 2022-10-04 DIAGNOSIS — N200 Calculus of kidney: Secondary | ICD-10-CM

## 2022-10-04 DIAGNOSIS — R109 Unspecified abdominal pain: Secondary | ICD-10-CM

## 2022-10-04 LAB — POC URINALSYSI DIPSTICK (AUTOMATED)
Bilirubin, UA: NEGATIVE
Blood, UA: NEGATIVE
Glucose, UA: NEGATIVE
Ketones, UA: NEGATIVE
Leukocytes, UA: NEGATIVE
Nitrite, UA: NEGATIVE
Protein, UA: NEGATIVE
Spec Grav, UA: 1.01 (ref 1.010–1.025)
Urobilinogen, UA: 0.2 E.U./dL
pH, UA: 6 (ref 5.0–8.0)

## 2022-10-04 MED ORDER — ONETOUCH DELICA PLUS LANCET33G MISC: 2 refills | Status: DC

## 2022-10-04 MED ORDER — NAPROXEN 375 MG PO TBEC: 1.0000 | DELAYED_RELEASE_TABLET | Freq: Two times a day (BID) | ORAL | 0 refills | Status: DC

## 2022-10-04 NOTE — Patient Instructions (Addendum)
Urine ok today, xray without obvious kidney stone - will await radiology evaluation. I've sent urine culture today, recommend treating with anti inflammatory sent to pharmacy naprosyn 375mg  twice daily with meals for 5-7 days then as needed.  Use heating pad, gentle stretching of back.  For belching - try Gas-X (simethicone).  Let us know if not improving with this.

## 2022-10-04 NOTE — Telephone Encounter (Signed)
Patient scheduled an appointment today 10/04/22 with Dr. Danise Mina at 2:00 pm

## 2022-10-04 NOTE — Progress Notes (Unsigned)
Patient ID: Roger Mcdaniel, male    DOB: 1951/03/18, 72 y.o.   MRN: [id number]  This visit was conducted in person.  BP (!) 146/82   Pulse 66   Temp 98.2 F (36.8 C)   Ht 5\' 4"  (1.626 m)   Wt 188 lb 8 oz (85.5 kg)   SpO2 98%   BMI 32.36 kg/m    CC: UTI symptoms, back pain Subjective:   HPI: Roger Mcdaniel is a 72 y.o. male presenting on 10/04/2022 for Back Pain (Lower right X 3 days ), Urinary Frequency (Every 15-20 mins), and Dysuria (Urine is milky looking. Drinking a lot of fluids. )   3d h/o dysuria, R lower back pain, dark urine. Notes nocturia x2-3. Also notes increased urinary frequency. Mild headache and increased belching. Denies inciting trauma/injury or falls. No trouble riding bicycle.   No recent abx course.  No fevers/chills, abd pain, blood in urine, blood in stool, nausea/vomiting. No rectal pain/pressure.   Recent gastroenteritis 2 wks ago. Normal bowel movements now.   Punctate left kidney stone by CT chest latest 07/2022.  Prior R kidney stone on CT abd/pelvis 02/2020 - 1.4cm right renal pelvis without hydronephrosis, also found R kidney cyst measuring 6x6cm.   He saw urologist Dr Gloriann Loan 2021 s/p blasting of large right kidney stone, pt states he needed to return for further stone extraction, had ureteral stent placed at that time.      Relevant past medical, surgical, family and social history reviewed and updated as indicated. Interim medical history since our last visit reviewed. Allergies and medications reviewed and updated. Outpatient Medications Prior to Visit  Medication Sig Dispense Refill   Ascorbic Acid (VITAMIN C) 1000 MG tablet Take 1,000 mg by mouth daily. Every morning     aspirin EC 81 MG tablet Take 1 tablet (81 mg total) by mouth daily. Swallow whole. 30 tablet 0   atorvastatin (LIPITOR) 40 MG tablet Take 1 tablet (40 mg total) by mouth daily. 90 tablet 3   Cholecalciferol (VITAMIN D3) 50 MCG (2000 UT) TABS Take 1 tablet by mouth daily. Every  morning     Magnesium 250 MG TABS Take 250 mg by mouth daily.     metFORMIN (GLUCOPHAGE) 500 MG tablet Take 1 tablet (500 mg total) by mouth daily with breakfast. 90 tablet 3   ONETOUCH ULTRA test strip CHECK BLOOD SUGAR EVERY DAY 100 strip 3   zinc gluconate 50 MG tablet Take 50 mg by mouth daily.     Lancets (ONETOUCH DELICA PLUS [id number]) MISC CHECK SUGAR ONCE DAILY DX E11.9 100 each 2   No facility-administered medications prior to visit.     Per HPI unless specifically indicated in ROS section below Review of Systems  Objective:  BP (!) 146/82   Pulse 66   Temp 98.2 F (36.8 C)   Ht 5\' 4"  (1.626 m)   Wt 188 lb 8 oz (85.5 kg)   SpO2 98%   BMI 32.36 kg/m   Wt Readings from Last 3 Encounters:  10/04/22 188 lb 8 oz (85.5 kg)  07/27/22 168 lb (76.2 kg)  07/12/22 177 lb 12.8 oz (80.6 kg)      Physical Exam Vitals and nursing note reviewed.  Constitutional:      Appearance: Normal appearance. He is not ill-appearing.  HENT:     Head: Normocephalic and atraumatic.  Eyes:     Extraocular Movements: Extraocular movements intact.     Pupils: Pupils are equal,  round, and reactive to light.  Cardiovascular:     Rate and Rhythm: Normal rate and regular rhythm.     Pulses: Normal pulses.     Heart sounds: Normal heart sounds. No murmur heard. Pulmonary:     Effort: Pulmonary effort is normal. No respiratory distress.     Breath sounds: Normal breath sounds. No wheezing, rhonchi or rales.  Abdominal:     General: Bowel sounds are normal. There is no distension.     Palpations: Abdomen is soft. There is no mass.     Tenderness: There is no abdominal tenderness. There is no right CVA tenderness, left CVA tenderness, guarding or rebound. Negative signs include Murphy's sign.     Hernia: No hernia is present.    Musculoskeletal:     Right lower leg: No edema.     Left lower leg: No edema.     Comments:  No pain midline spine Right paraspinous mm tenderness, reproducible to  palpation along right lateral side, with sitting up from supine position  Skin:    General: Skin is warm and dry.     Findings: No rash.  Neurological:     Mental Status: He is alert.  Psychiatric:        Mood and Affect: Mood normal.        Behavior: Behavior normal.       Results for orders placed or performed in visit on 10/04/22  POCT Urinalysis Dipstick (Automated)  Result Value Ref Range   Color, UA Yellow    Clarity, UA clear    Glucose, UA Negative Negative   Bilirubin, UA Negative    Ketones, UA Negative    Spec Grav, UA 1.010 1.010 - 1.025   Blood, UA Negative    pH, UA 6.0 5.0 - 8.0   Protein, UA Negative Negative   Urobilinogen, UA 0.2 0.2 or 1.0 E.U./dL   Nitrite, UA Negative    Leukocytes, UA Negative Negative   Lab Results  Component Value Date   HGBA1C 5.9 05/17/2022    Assessment & Plan:   Problem List Items Addressed This Visit     Right nephrolithiasis    H/o this s/p treatment 2021 UA without blood.  Update KUB.       Acute right-sided low back pain without sciatica - Primary    R lateral low back pain of sudden onset without inciting trauma/injury.  Positional, reproducible points to MSK cause. Regardless, in h/o kidney stones, check UA and KUB - overall reassuring, await radiology read.  Rx short course of naprosyn 375mg  BID with meals, rec gentle stretching, ice/heat, update if not improving with this.       Relevant Medications   Naproxen (EC-NAPROSYN) 375 MG TBEC   Dysuria    UA today normal. Send UCx.  Symptoms not consistent with prostatitis.       Relevant Orders   POCT Urinalysis Dipstick (Automated) (Completed)   Urine Culture   Other Visit Diagnoses     Right flank pain       Relevant Orders   DG Abd 1 View        Meds ordered this encounter  Medications   Naproxen (EC-NAPROSYN) 375 MG TBEC    Sig: Take 1 tablet (375 mg total) by mouth in the morning and at bedtime. Take with meals for 5-7 days, then as needed     Dispense:  30 tablet    Refill:  0   Lancets (ONETOUCH DELICA PLUS LANCET33G)  MISC    Sig: Use to check sugars daily    Dispense:  100 each    Refill:  2    Dx E11.9   Orders Placed This Encounter  Procedures   Urine Culture   DG Abd 1 View    Standing Status:   Future    Number of Occurrences:   1    Standing Expiration Date:   10/05/2023    Order Specific Question:   Reason for Exam (SYMPTOM  OR DIAGNOSIS REQUIRED)    Answer:   R flank pain, h/o kidney stone    Order Specific Question:   Preferred imaging location?    Answer:   [facility]   POCT Urinalysis Dipstick (Automated)     Patient Instructions  Urine ok today, xray without obvious kidney stone - will await radiology evaluation. I've sent urine culture today, recommend treating with anti inflammatory sent to pharmacy naprosyn 375mg  twice daily with meals for 5-7 days then as needed.  Use heating pad, gentle stretching of back.  For belching - try Gas-X (simethicone).  Let know if not improving with this.   Follow up plan: Return if symptoms worsen or fail to improve.  [place], MD

## 2022-10-04 NOTE — Telephone Encounter (Signed)
Will see today.  

## 2022-10-05 DIAGNOSIS — R300 Dysuria: Secondary | ICD-10-CM | POA: Insufficient documentation

## 2022-10-05 DIAGNOSIS — M545 Low back pain, unspecified: Secondary | ICD-10-CM | POA: Insufficient documentation

## 2022-10-05 LAB — URINE CULTURE
MICRO NUMBER:: 14401874
Result:: NO GROWTH
SPECIMEN QUALITY:: ADEQUATE

## 2022-10-05 NOTE — Assessment & Plan Note (Signed)
R lateral low back pain of sudden onset without inciting trauma/injury.  Positional, reproducible points to MSK cause. Regardless, in h/o kidney stones, check UA and KUB - overall reassuring, await radiology read.  Rx short course of naprosyn 375mg  BID with meals, rec gentle stretching, ice/heat, update if not improving with this.

## 2022-10-05 NOTE — Assessment & Plan Note (Signed)
UA today normal. Send UCx.  Symptoms not consistent with prostatitis.

## 2022-10-05 NOTE — Assessment & Plan Note (Signed)
H/o this s/p treatment 2021 UA without blood.  Update KUB.

## 2022-10-18 ENCOUNTER — Ambulatory Visit (INDEPENDENT_AMBULATORY_CARE_PROVIDER_SITE_OTHER): Payer: Medicare Other | Admitting: Adult Health

## 2022-10-18 ENCOUNTER — Telehealth: Payer: Self-pay | Admitting: Family Medicine

## 2022-10-18 NOTE — Telephone Encounter (Signed)
Patient called and asked would Dr. Danise Mina see his brother Roger Mcdaniel as a new patient. Call back number  [phone].

## 2022-10-19 ENCOUNTER — Encounter: Payer: Self-pay | Admitting: Family Medicine

## 2022-10-22 NOTE — Telephone Encounter (Signed)
Ok to schedule new patient appointment thanks.

## 2022-10-25 ENCOUNTER — Ambulatory Visit: Payer: Medicare Other | Admitting: Cardiovascular Disease

## 2022-11-04 ENCOUNTER — Encounter: Payer: Self-pay | Admitting: Cardiovascular Disease

## 2022-11-04 NOTE — Progress Notes (Signed)
Cardiology Office Note   Date:  11/05/2022   ID:  Roger Mcdaniel, Roger Mcdaniel October 31, 1950, MRN [id number]  PCP:  Ria Bush, MD  Cardiologist:   Mertie Moores, MD   Chief Complaint  Patient presents with   Hypertension        Hyperlipidemia   1. Hypertension 2. Obesity 3. Obstructive sleep apnea   Notes prior to 2014   Caleb is a 72 year old gentleman with a history of hypertension and sleep apnea. I saw him approximately one month ago for hypertension. He has been gradually titrating his medications up and he still had episodes of hypertension. He's been limiting his salt intake. He denies any chest pain or shortness of breath.  He's been taking care of his granddaughter for the past 6 months. As result he's gained a lot of weight. He's not been able to exercise.  He was tried on Pravachol. He had lots of muscle aches and pains and had to stop the Pravachol. His muscle aches and pains have improved.  Feb 09, 2013:  Doing well from a cardiac standpoint.  He had some generalized aches that lasted 3 days.  Associated with some stomach issues.  Feb. 23, 2015:  Morrison is feeling well. He has been having some episodes of lightheadedness and has been found to have orthostatic hypotension. One is his 37 yo cousins diet this past week of a sudden heart attack.     He has had some left shoulder / rotator cuff difficulities.   He has had some leg swelling.  He has not been exercising much.    06/28/2014:  Liborio is doing ok.  Had neck surgery since I have seen him.   He has taken a new job - much less stress. He has lost > 10 lbs.     December 19, 2014:  BURLE KIVETT is a 72 y.o. male who presents for follow up of his HTN Doing much better with his new job.  Exercising , walking regulary.   December 23, 2015:  Azende is seen back for a 1 year visit. His wife died after leg surgery a year Has gained some weight  Has been diagnosed with DM type 2.   Has been going to diabetes classes  and has lost some weight. Using the stairs instead of the elevator .    Jan. 16, 2023: Hale is seen back for follow up visit  Was seen 5 years ago  [place] on the left side of his chest Nov. 11.   Developed left sided chest pain on Dec. 22 Called nurse ,   called EMS  Went to the er , BP was elevated , Troponin was negative , work up in [employer] was negative .  Was given script for NTG .  CRR shows partial collaspse of the left lower lobe with progression of the left hemidiaphragm elevation  CT showed the L pneumothorax  Also showed extensive CAC     Pain is not worsened with deep breath The discomfort has generally improved.  BP at home is generally improved.  We did a stress test years ago .   LDL is 45.    Feb. 9, 2024 Vanessa is seen for a 1 year follow up of his HLD, chest pain  He had a myoview last year for evaluation of his CP  Myoview from jan. 17, 2023 was low risk, no ischemia, no infarction Wife Tye [place] passed away about 8 years ago .  BP has  been good at home  No CP , no dyspnea .  Last week went to [place].  Labs from Aug. 2023 LDL = 40 Chol = 96 HDL = 46 Trigs = 50  Overall dong well      Past Medical History:  Diagnosis Date   Back pain    Bruises easily    Chest pain    CMC arthritis, thumb, degenerative    Collapse of left lung    DDD (degenerative disc disease), cervical 2015   s/o ACDF ([place])   DM type 2 (diabetes mellitus, type 2) ([place])    Dry skin    Edema, lower extremity    GERD 07/10/2008   Qualifier: Diagnosis of  By: Council Mechanic MD, Hilaria Ota    GERD (gastroesophageal reflux disease) 09/2003   History of ETT 05/23/2000   Nml (Dr. Acie Fredrickson)   History of kidney stones    HNP (herniated nucleus pulposus), cervical 12/20/2013   Hyperlipidemia 09/2003   Hypertension 06/2004   Joint pain    Obesity    OSA (obstructive sleep apnea)    was on CPAP, not used  in 25-30 years    Pre-diabetes    SOB (shortness of breath)    Swallowing  difficulty     Past Surgical History:  Procedure Laterality Date   ANTERIOR CERVICAL DECOMP/DISCECTOMY FUSION N/A 01/14/2014   Procedure: ANTERIOR CERVICAL DECOMPRESSION/DISCECTOMY FUSION CERVICAL FOUR-FIVE,CERVICAL FIVE-SIX ;  Surgeon: Hosie Spangle, MD   BRONCHIAL WASHINGS Left 11/05/2021   Procedure: BRONCHIAL WASHINGS;  Surgeon: Garner Nash, DO;  Location: [facility];  Service: Pulmonary;  Laterality: Left;   CATARACT EXTRACTION Right 02/28/2019   Per patient   CATARACT EXTRACTION Left 03/12/2019   Per patient   CYSTOSCOPY/URETEROSCOPY/HOLMIUM LASER/STENT PLACEMENT Right 05/28/2020   Procedure: CYSTOSCOPY RIGHT URETEROSCOPY/HOLMIUM LASER/STENT PLACEMENT STONE BASKET RETRACTION;  Surgeon: Lucas Mallow, MD;  Location: [facility];  Service: Urology;  Laterality: Right;   EXTRACORPOREAL SHOCK WAVE LITHOTRIPSY Right 03/17/2020   Procedure: RIGHT EXTRACORPOREAL SHOCK WAVE LITHOTRIPSY (ESWL);  Surgeon: Ceasar Mons, MD;  Location: [facility];  Service: Urology;  Laterality: Right;   FLEXIBLE BRONCHOSCOPY  10/2021   Flexible video fiberoptic bronchoscopy and biopsies for partially collapsed L lung ([facility])   KNEE ARTHROPLASTY Right 11/27/2020   Procedure: COMPUTER ASSISTED TOTAL KNEE ARTHROPLASTY;  Surgeon: Rod Can, MD;  Location: [facility];  Service: Orthopedics;  Laterality: Right;   ORIF ANKLE FRACTURE  1980   right   RETINAL LASER PROCEDURE Right 03/14/2019   Per patient   SEPTOPLASTY  2005   for sleep apnea //cpap   SHOULDER SURGERY Right 06/2017   Dr Onnie Graham at [facility] Bilateral 11/05/2021   Procedure: VIDEO BRONCHOSCOPY WITHOUT FLUORO;  Surgeon: Garner Nash, DO;  Location: [place];  Service: Pulmonary;  Laterality: Bilateral;  Cytology not needed   WRIST ARTHROSCOPY WITH CARPOMETACARPEL (CMC) ARTHROPLASTY Right 11/2018   R CMC and R MCP arthroplasty ([place])     Current Outpatient  Medications  Medication Sig Dispense Refill   Ascorbic Acid (VITAMIN C) 1000 MG tablet Take 1,000 mg by mouth daily. Every morning     aspirin EC 81 MG tablet Take 1 tablet (81 mg total) by mouth daily. Swallow whole. 30 tablet 0   atorvastatin (LIPITOR) 40 MG tablet Take 1 tablet (40 mg total) by mouth daily. 90 tablet 3   Cholecalciferol (VITAMIN D3) 50 MCG (2000 UT) TABS Take 1 tablet  by mouth daily. Every morning     Lancets (ONETOUCH DELICA PLUS 123XX123) MISC Use to check sugars daily 100 each 2   Magnesium 250 MG TABS Take 250 mg by mouth daily.     metFORMIN (GLUCOPHAGE) 500 MG tablet Take 1 tablet (500 mg total) by mouth daily with breakfast. 90 tablet 3   ONETOUCH ULTRA test strip CHECK BLOOD SUGAR EVERY DAY 100 strip 3   zinc gluconate 50 MG tablet Take 50 mg by mouth daily.     No current facility-administered medications for this visit.    Allergies:   Codeine sulfate and Pravachol [pravastatin sodium]    Social History:  The patient  reports that he has never smoked. He has never used smokeless tobacco. He reports that he does not currently use alcohol after a past usage of about 1.0 standard drink of alcohol per week. He reports that he does not use drugs.   Family History:  The patient's family history includes CVA in his mother; Cancer in his mother and another family member; Heart disease in his father and paternal grandfather; Hypertension in his brother; Obesity in his brother.    ROS:  Please see the history of present illness.      All other systems are reviewed and negative.    Physical Exam: Blood pressure 130/66, pulse 62, height 5' 4.5" (1.638 m), weight 183 lb (83 kg), SpO2 100 %.       GEN:  Well nourished, well developed in no acute distress HEENT: Normal NECK: No JVD; No carotid bruits LYMPHATICS: No lymphadenopathy CARDIAC: RRR , no murmurs, rubs, gallops RESPIRATORY:  Clear to auscultation without rales, wheezing or rhonchi  ABDOMEN: Soft,  non-tender, non-distended MUSCULOSKELETAL:  No edema; No deformity  SKIN: Warm and dry NEUROLOGIC:  Alert and oriented x 3    EKG:       Recent Labs: 01/05/2022: TSH 2.200 05/17/2022: ALT 14; BUN 19; Creatinine, Ser 0.99; Potassium 4.6; Sodium 139    Lipid Panel    Component Value Date/Time   CHOL 96 05/17/2022 0733   CHOL 107 01/05/2022 0858   TRIG 50.0 05/17/2022 0733   TRIG 107 04/02/2013 0739   HDL 46.30 05/17/2022 0733   HDL 45 01/05/2022 0858   HDL 38 (L) 04/02/2013 0739   CHOLHDL 2 05/17/2022 0733   VLDL 10.0 05/17/2022 0733   LDLCALC 40 05/17/2022 0733   LDLCALC 48 01/05/2022 0858   LDLCALC 55 04/02/2013 0739   LDLDIRECT 143.5 04/27/2011 0816      Wt Readings from Last 3 Encounters:  11/05/22 183 lb (83 kg)  10/04/22 188 lb 8 oz (85.5 kg)  07/27/22 168 lb (76.2 kg)      Other studies Reviewed: Additional studies/ records that were reviewed today include: . Review of the above records demonstrates:    ASSESSMENT AND PLAN:  1. Hypertension -  BP is well controlled.    2. Obesity -   has lost quite a bit of weight .     3.  Pneumothorax/chest pain :      4. Hyperglycemia:  per his primary  MD     5. Hyperlipidemia:    lipids are well controlled.    Current medicines are reviewed at length with the patient today.  The patient does not have concerns regarding medicines.  The following changes have been made:  no change  Labs/ tests ordered today include:   No orders of the defined types were placed in this encounter.  Disposition:      I will plan on seeing him in 1 year.   Signed, Mertie Moores, MD  11/05/2022 9:08 AM    [facility] [address], [place], [place]  [address] Phone: [phone]; Fax: [phone]

## 2022-11-05 ENCOUNTER — Encounter: Payer: Self-pay | Admitting: Cardiovascular Disease

## 2022-11-05 ENCOUNTER — Ambulatory Visit: Payer: Medicare Other | Attending: Cardiovascular Disease | Admitting: Cardiovascular Disease

## 2022-11-05 VITALS — BP 130/66 | HR 62 | Ht 64.5 in | Wt 183.0 lb

## 2022-11-05 DIAGNOSIS — I10 Essential (primary) hypertension: Secondary | ICD-10-CM

## 2022-11-05 DIAGNOSIS — E1169 Type 2 diabetes mellitus with other specified complication: Secondary | ICD-10-CM | POA: Diagnosis not present

## 2022-11-05 DIAGNOSIS — I2510 Atherosclerotic heart disease of native coronary artery without angina pectoris: Secondary | ICD-10-CM | POA: Diagnosis not present

## 2022-11-05 DIAGNOSIS — E785 Hyperlipidemia, unspecified: Secondary | ICD-10-CM

## 2022-11-05 NOTE — Patient Instructions (Signed)
Medication Instructions:  Your physician recommends that you continue on your current medications as directed. Please refer to the Current Medication list given to you today.   *If you need a refill on your cardiac medications before your next appointment, please call your pharmacy*   Lab Work: NONE If you have labs (blood work) drawn today and your tests are completely normal, you will receive your results only by: [place] (if you have MyChart) OR A paper copy in the mail If you have any lab test that is abnormal or we need to change your treatment, we will call you to review the results.   Testing/Procedures: NONE   Follow-Up: At [facility], you and your health needs are our priority.  As part of our continuing mission to provide you with exceptional heart care, we have created designated Provider Care Teams.  These Care Teams include your primary Cardiologist (physician) and Advanced Practice Providers (APPs -  Physician Assistants and Nurse Practitioners) who all work together to provide you with the care you need, when you need it.  We recommend signing up for the patient portal called "MyChart".  Sign up information is provided on this After Visit Summary.  MyChart is used to connect with patients for Virtual Visits (Telemedicine).  Patients are able to view lab/test results, encounter notes, upcoming appointments, etc.  Non-urgent messages can be sent to your provider as well.   To learn more about what you can do with MyChart, go to [email or web address].    Your next appointment:   1 year(s)  Provider:   Mertie Moores, MD

## 2022-11-08 ENCOUNTER — Ambulatory Visit (INDEPENDENT_AMBULATORY_CARE_PROVIDER_SITE_OTHER): Payer: Medicare Other | Admitting: Adult Health

## 2022-11-08 ENCOUNTER — Encounter (INDEPENDENT_AMBULATORY_CARE_PROVIDER_SITE_OTHER): Payer: Self-pay | Admitting: Adult Health

## 2022-11-08 VITALS — BP 149/70 | HR 66 | Temp 98.0°F | Ht 64.0 in | Wt 179.0 lb

## 2022-11-08 DIAGNOSIS — I2510 Atherosclerotic heart disease of native coronary artery without angina pectoris: Secondary | ICD-10-CM | POA: Diagnosis not present

## 2022-11-08 DIAGNOSIS — E669 Obesity, unspecified: Secondary | ICD-10-CM | POA: Insufficient documentation

## 2022-11-08 DIAGNOSIS — I152 Hypertension secondary to endocrine disorders: Secondary | ICD-10-CM

## 2022-11-08 DIAGNOSIS — E1169 Type 2 diabetes mellitus with other specified complication: Secondary | ICD-10-CM

## 2022-11-08 DIAGNOSIS — Z6837 Body mass index (BMI) 37.0-37.9, adult: Secondary | ICD-10-CM

## 2022-11-08 DIAGNOSIS — Z7984 Long term (current) use of oral hypoglycemic drugs: Secondary | ICD-10-CM

## 2022-11-08 DIAGNOSIS — Z6830 Body mass index (BMI) 30.0-30.9, adult: Secondary | ICD-10-CM

## 2022-11-08 DIAGNOSIS — E1159 Type 2 diabetes mellitus with other circulatory complications: Secondary | ICD-10-CM | POA: Diagnosis not present

## 2022-11-08 DIAGNOSIS — E559 Vitamin D deficiency, unspecified: Secondary | ICD-10-CM | POA: Diagnosis not present

## 2022-11-08 DIAGNOSIS — E785 Hyperlipidemia, unspecified: Secondary | ICD-10-CM | POA: Diagnosis not present

## 2022-11-09 ENCOUNTER — Encounter: Payer: Self-pay | Admitting: Cardiovascular Disease

## 2022-11-09 LAB — COMPREHENSIVE METABOLIC PANEL
ALT: 22 IU/L (ref 0–44)
AST: 24 IU/L (ref 0–40)
Albumin/Globulin Ratio: 1.9 (ref 1.2–2.2)
Albumin: 4.3 g/dL (ref 3.9–4.9)
Alkaline Phosphatase: 87 IU/L (ref 44–121)
BUN/Creatinine Ratio: 18 (ref 10–24)
BUN: 17 mg/dL (ref 8–27)
Bilirubin Total: 0.9 mg/dL (ref 0.0–1.2)
CO2: 24 mmol/L (ref 20–29)
Calcium: 9.3 mg/dL (ref 8.6–10.2)
Chloride: 102 mmol/L (ref 96–106)
Creatinine, Ser: 0.93 mg/dL (ref 0.76–1.27)
Globulin, Total: 2.3 g/dL (ref 1.5–4.5)
Glucose: 93 mg/dL (ref 70–99)
Potassium: 5.1 mmol/L (ref 3.5–5.2)
Sodium: 140 mmol/L (ref 134–144)
Total Protein: 6.6 g/dL (ref 6.0–8.5)
eGFR: 88 mL/min/{1.73_m2} (ref >59)

## 2022-11-09 LAB — INSULIN, RANDOM: INSULIN: 8.9 u[IU]/mL (ref 2.6–24.9)

## 2022-11-09 LAB — LIPID PANEL
Chol/HDL Ratio: 2.4 ratio (ref 0.0–5.0)
Cholesterol, Total: 129 mg/dL (ref 100–199)
HDL: 53 mg/dL (ref >39)
LDL Chol Calc (NIH): 63 mg/dL (ref 0–99)
Triglycerides: 59 mg/dL (ref 0–149)
VLDL Cholesterol Cal: 13 mg/dL (ref 5–40)

## 2022-11-09 LAB — HEMOGLOBIN A1C
Est. average glucose Bld gHb Est-mCnc: 120 mg/dL
Hgb A1c MFr Bld: 5.8 % — ABNORMAL HIGH (ref 4.8–5.6)

## 2022-11-09 LAB — VITAMIN D 25 HYDROXY (VIT D DEFICIENCY, FRACTURES): Vit D, 25-Hydroxy: 47.9 ng/mL (ref 30.0–100.0)

## 2022-11-10 ENCOUNTER — Encounter (INDEPENDENT_AMBULATORY_CARE_PROVIDER_SITE_OTHER): Payer: Self-pay | Admitting: Adult Health

## 2022-11-10 NOTE — Progress Notes (Signed)
Chief Complaint:   OBESITY Roger Mcdaniel is here to discuss his progress with his obesity treatment plan along with follow-up of his obesity related diagnoses. Roger Mcdaniel is on practicing portion control and making smarter food choices, such as increasing vegetables and decreasing simple carbohydrates and states he is following his eating plan approximately 30% of the time.  Roger Mcdaniel states he is walking and climbing stairs 30 minutes 3 times per week.  Today's visit was #: 10 Starting weight: 221 lbs Starting date: 01/05/2022 Today's weight: 179 lbs Today's date: 11/10/2022 Total lbs lost to date: 42 Total lbs lost since last in-office visit: + 9 lbs  Interim History:  Roger Mcdaniel is currently in Mx Phase, followed at [facility] Results with pt: Muscle Mass + 2.8 lbs Adipose Mass + 8.4 lbs  His brother recently moved to local area.  Subjective:   1. Hypertension associated with type 2 diabetes mellitus ([place]) Followed by Dr. Barbette Or Last OV Notes 11/05/2022: Feb. 9, 2024 Roger Mcdaniel is seen for a 1 year follow up of his HLD, chest pain  He had a myoview last year for evaluation of his CP  Myoview from jan. 17, 2023 was low risk, no ischemia, no infarction Wife Roger Mcdaniel passed away about 8 years ago .  BP has been good at home  No CP , no dyspnea .   Last week went to [place].  Labs from Aug. 2023 LDL = 40 Chol = 96 HDL = 46 Trigs = 50   Overall dong well   2. Diabetes mellitus type 2 in obese [facility]) Lab Results  Component Value Date   HGBA1C 5.8 (H) 11/08/2022   HGBA1C 5.9 05/17/2022   HGBA1C 6.4 (H) 01/05/2022   PCP managed Metformin 533m QD  3. Hyperlipidemia associated with type 2 diabetes mellitus ([facility] Lipid Panel     Component Value Date/Time   CHOL 129 11/08/2022 0814   TRIG 59 11/08/2022 0814   TRIG 107 04/02/2013 0739   HDL 53 11/08/2022 0814   HDL 38 (L) 04/02/2013 0739   CHOLHDL 2.4 11/08/2022 0814   CHOLHDL 2 05/17/2022 0733   VLDL 10.0  05/17/2022 0733   LDLCALC 63 11/08/2022 0814   LDLCALC 55 04/02/2013 0739   LDLDIRECT 143.5 04/27/2011 0816   LABVLDL 13 11/08/2022 0814  PCP manages daily Atorvastatin 451m He denies myalgias  4. Vitamin D deficiency  Latest Reference Range & Units 11/08/22 08:14  Vitamin D, 25-Hydroxy 30.0 - 100.0 ng/mL 47.9  He is taking OTC Vit D3 2000 IU QD  Assessment/Plan:   1. Hypertension associated with type 2 diabetes mellitus ([facility] - Comprehensive metabolic panel  2. Diabetes mellitus type 2 in obese (HCC) Check Labs - Hemoglobin A1c - Insulin, random  3. Hyperlipidemia associated with type 2 diabetes mellitus ([facility] - Lipid panel  4. Vitamin D deficiency Check Labs - VITAMIN D 25 Hydroxy (Vit-D Deficiency, Fractures)  6. Obestiy, Beginning BMI 37.93  BoWillys currently in the action stage of change. As such, his goal is to maintain weight for now. He has agreed to practicing portion control and making smarter food choices, such as increasing vegetables and decreasing simple carbohydrates.   Exercise goals: Older adults should determine their level of effort for physical activity relative to their level of fitness.   Behavioral modification strategies: increasing lean protein intake, decreasing simple carbohydrates, increasing vegetables, increasing water intake, decreasing sodium intake, meal planning and cooking strategies, keeping healthy  foods in the home, ways to avoid boredom eating, and planning for success.  Roger Mcdaniel has agreed to follow-up with our clinic in 8 weeks. He was informed of the importance of frequent follow-up visits to maximize his success with intensive lifestyle modifications for his multiple health conditions.   Roger Mcdaniel was informed we would discuss his lab results at his next visit unless there is a critical issue that needs to be addressed sooner. Roger Mcdaniel agreed to keep his next visit at the agreed upon time to discuss these  results.  Objective:   Blood pressure (!) 149/70, pulse 66, temperature 98 F (36.7 C), height 5' 4"$  (1.626 m), weight 179 lb (81.2 kg), SpO2 (!) 66 %. Body mass index is 30.73 kg/m.  General: Cooperative, alert, well developed, in no acute distress. HEENT: Conjunctivae and lids unremarkable. Cardiovascular: Regular rhythm.  Lungs: Normal work of breathing. Neurologic: No focal deficits.   Lab Results  Component Value Date   CREATININE 0.93 11/08/2022   BUN 17 11/08/2022   NA 140 11/08/2022   K 5.1 11/08/2022   CL 102 11/08/2022   CO2 24 11/08/2022   Lab Results  Component Value Date   ALT 22 11/08/2022   AST 24 11/08/2022   ALKPHOS 87 11/08/2022   BILITOT 0.9 11/08/2022   Lab Results  Component Value Date   HGBA1C 5.8 (H) 11/08/2022   HGBA1C 5.9 05/17/2022   HGBA1C 6.4 (H) 01/05/2022   HGBA1C 6.4 09/29/2021   HGBA1C 6.4 05/18/2021   Lab Results  Component Value Date   INSULIN 8.9 11/08/2022   INSULIN 4.8 06/29/2022   INSULIN 10.1 01/05/2022   Lab Results  Component Value Date   TSH 2.200 01/05/2022   Lab Results  Component Value Date   CHOL 129 11/08/2022   HDL 53 11/08/2022   LDLCALC 63 11/08/2022   LDLDIRECT 143.5 04/27/2011   TRIG 59 11/08/2022   CHOLHDL 2.4 11/08/2022   Lab Results  Component Value Date   VD25OH 47.9 11/08/2022   VD25OH 66.25 05/17/2022   VD25OH 18.0 (L) 01/05/2022   Lab Results  Component Value Date   WBC 7.4 11/05/2021   HGB 14.6 11/05/2021   HCT 45.1 11/05/2021   MCV 91.9 11/05/2021   PLT 256 11/05/2021   No results found for: "IRON", "TIBC", "FERRITIN"   Attestation Statements:   Reviewed by clinician on day of visit: allergies, medications, problem list, medical history, surgical history, family history, social history, and previous encounter notes.   I have reviewed the above documentation for accuracy and completeness, and I agree with the above. -  Roger Spink d. Angelyse Heslin, NP-C

## 2022-11-29 ENCOUNTER — Encounter: Payer: Self-pay | Admitting: Family Medicine

## 2022-11-29 ENCOUNTER — Ambulatory Visit (INDEPENDENT_AMBULATORY_CARE_PROVIDER_SITE_OTHER): Payer: Medicare Other | Admitting: Family Medicine

## 2022-11-29 VITALS — BP 138/70 | HR 59 | Temp 97.4°F | Ht 64.0 in | Wt 189.1 lb

## 2022-11-29 DIAGNOSIS — I2510 Atherosclerotic heart disease of native coronary artery without angina pectoris: Secondary | ICD-10-CM | POA: Diagnosis not present

## 2022-11-29 DIAGNOSIS — E669 Obesity, unspecified: Secondary | ICD-10-CM

## 2022-11-29 DIAGNOSIS — E1169 Type 2 diabetes mellitus with other specified complication: Secondary | ICD-10-CM | POA: Diagnosis not present

## 2022-11-29 DIAGNOSIS — E559 Vitamin D deficiency, unspecified: Secondary | ICD-10-CM | POA: Diagnosis not present

## 2022-11-29 NOTE — Assessment & Plan Note (Signed)
Chronic, stable. Followed by healthy [facility] wellness center.  Obesity complicated by diabetes, osteoarthritis, hypertension, hyperlipidemia, CAD.

## 2022-11-29 NOTE — Patient Instructions (Signed)
Good to see you today You are doing great! Continue current medicines.  Return as needed or in 6 months for physical.

## 2022-11-29 NOTE — Assessment & Plan Note (Signed)
Doing well on 2000 IU daily replacement.

## 2022-11-29 NOTE — Progress Notes (Signed)
Patient ID: Roger Mcdaniel, male    DOB: [birth year], 72 y.o.   MRN: [id number]  This visit was conducted in person.  BP 138/70   Pulse (!) 59   Temp (!) 97.4 F (36.3 C) (Temporal)   Ht '5\' 4"'$  (1.626 m)   Wt 189 lb 2 oz (85.8 kg)   SpO2 100%   BMI 32.46 kg/m    CC: 6 mo f/u visit  Subjective:   HPI: Roger Mcdaniel is a 72 y.o. male presenting on 11/29/2022 for Medical Management of Chronic Issues (Here for 6 mo DM f/u.)   Recent trip with son to [place]. Lots of walking during this trip.  H/o R knee replacement - noticing some discomfort to R lateral knee. Planning to increase bicycling.   Regularly sees healthy [facility] wellness center.  Found to be vit d deficient - now on 2000 IU daily. .  Incidental collapse of L lung 11/2021 - followed by pulm s/p reassuring bronchoscopy.  Latest CT scan 07/2022 reassuring.   DM - does regularly check fasting sugars 96 this morning. Compliant with antihyperglycemic regimen which includes: metformin '500mg'$  daily. Denies low sugars or hypoglycemic symptoms. Denies paresthesias, blurry vision. Last diabetic eye exam 04/2022. Glucometer brand: one touch ultra. Last foot exam: 10/2021 - DUE. DSME: completed remotely at [facility].  Lab Results  Component Value Date   HGBA1C 5.8 (H) 11/08/2022   Diabetic Foot Exam - Simple   Simple Foot Form Diabetic Foot exam was performed with the following findings: Yes 11/29/2022  8:13 AM  Visual Inspection No deformities, no ulcerations, no other skin breakdown bilaterally: Yes Sensation Testing Intact to touch and monofilament testing bilaterally: Yes Pulse Check Posterior Tibialis and Dorsalis pulse intact bilaterally: Yes Comments    Lab Results  Component Value Date   MICROALBUR 4.3 (H) 05/17/2022         Relevant past medical, surgical, family and social history reviewed and updated as indicated. Interim medical history since our last visit reviewed. Allergies and medications reviewed and  updated. Outpatient Medications Prior to Visit  Medication Sig Dispense Refill   Ascorbic Acid (VITAMIN C) 1000 MG tablet Take 1,000 mg by mouth daily. Every morning     aspirin EC 81 MG tablet Take 1 tablet (81 mg total) by mouth daily. Swallow whole. 30 tablet 0   atorvastatin (LIPITOR) 40 MG tablet Take 1 tablet (40 mg total) by mouth daily. 90 tablet 3   Cholecalciferol (VITAMIN D3) 50 MCG (2000 UT) TABS Take 1 tablet by mouth daily. Every morning     Lancets (ONETOUCH DELICA PLUS 123XX123) MISC Use to check sugars daily 100 each 2   Magnesium 250 MG TABS Take 250 mg by mouth daily.     metFORMIN (GLUCOPHAGE) 500 MG tablet Take 1 tablet (500 mg total) by mouth daily with breakfast. 90 tablet 3   ONETOUCH ULTRA test strip CHECK BLOOD SUGAR EVERY DAY 100 strip 3   zinc gluconate 50 MG tablet Take 50 mg by mouth daily.     No facility-administered medications prior to visit.     Per HPI unless specifically indicated in ROS section below Review of Systems  Objective:  BP 138/70   Pulse (!) 59   Temp (!) 97.4 F (36.3 C) (Temporal)   Ht '5\' 4"'$  (1.626 m)   Wt 189 lb 2 oz (85.8 kg)   SpO2 100%   BMI 32.46 kg/m   Wt Readings from Last 3 Encounters:  11/29/22 189 lb 2 oz (85.8 kg)  11/08/22 179 lb (81.2 kg)  11/05/22 183 lb (83 kg)      Physical Exam Vitals and nursing note reviewed.  Constitutional:      Appearance: Normal appearance. He is not ill-appearing.  Eyes:     Extraocular Movements: Extraocular movements intact.     Conjunctiva/sclera: Conjunctivae normal.     Pupils: Pupils are equal, round, and reactive to light.  Cardiovascular:     Rate and Rhythm: Normal rate and regular rhythm.     Pulses: Normal pulses.     Heart sounds: Normal heart sounds. No murmur heard. Pulmonary:     Effort: Pulmonary effort is normal. No respiratory distress.     Breath sounds: Normal breath sounds. No wheezing, rhonchi or rales.  Musculoskeletal:     Right lower leg: No edema.      Left lower leg: No edema.     Comments: See HPI for foot exam if done  Skin:    General: Skin is warm and dry.     Findings: No rash.  Neurological:     Mental Status: He is alert.  Psychiatric:        Mood and Affect: Mood normal.        Behavior: Behavior normal.       Results for orders placed or performed in visit on 11/08/22  Comprehensive metabolic panel  Result Value Ref Range   Glucose 93 70 - 99 mg/dL   BUN 17 8 - 27 mg/dL   Creatinine, Ser 0.93 0.76 - 1.27 mg/dL   eGFR 88 >59 mL/min/1.73   BUN/Creatinine Ratio 18 10 - 24   Sodium 140 134 - 144 mmol/L   Potassium 5.1 3.5 - 5.2 mmol/L   Chloride 102 96 - 106 mmol/L   CO2 24 20 - 29 mmol/L   Calcium 9.3 8.6 - 10.2 mg/dL   Total Protein 6.6 6.0 - 8.5 g/dL   Albumin 4.3 3.9 - 4.9 g/dL   Globulin, Total 2.3 1.5 - 4.5 g/dL   Albumin/Globulin Ratio 1.9 1.2 - 2.2   Bilirubin Total 0.9 0.0 - 1.2 mg/dL   Alkaline Phosphatase 87 44 - 121 IU/L   AST 24 0 - 40 IU/L   ALT 22 0 - 44 IU/L  VITAMIN D 25 Hydroxy (Vit-D Deficiency, Fractures)  Result Value Ref Range   Vit D, 25-Hydroxy 47.9 30.0 - 100.0 ng/mL  Hemoglobin A1c  Result Value Ref Range   Hgb A1c MFr Bld 5.8 (H) 4.8 - 5.6 %   Est. average glucose Bld gHb Est-mCnc 120 mg/dL  Insulin, random  Result Value Ref Range   INSULIN 8.9 2.6 - 24.9 uIU/mL  Lipid panel  Result Value Ref Range   Cholesterol, Total 129 100 - 199 mg/dL   Triglycerides 59 0 - 149 mg/dL   HDL 53 >39 mg/dL   VLDL Cholesterol Cal 13 5 - 40 mg/dL   LDL Chol Calc (NIH) 63 0 - 99 mg/dL   Chol/HDL Ratio 2.4 0.0 - 5.0 ratio    Assessment & Plan:   Problem List Items Addressed This Visit     Obesity, Class I, BMI 30-34.9    Chronic, stable. Followed by healthy weight and wellness center.  Obesity complicated by diabetes, osteoarthritis, hypertension, hyperlipidemia, CAD.       Type 2 diabetes mellitus with other specified complication (HCC) - Primary    Chronic, stable. Congratulated on  great control to date. He is motivated  to continue healthy diet and lifestyle choices.      Vitamin D deficiency    Doing well on 2000 IU daily replacement.         No orders of the defined types were placed in this encounter.   No orders of the defined types were placed in this encounter.   Patient Instructions  Good to see you today You are doing great! Continue current medicines.  Return as needed or in 6 months for physical.   Follow up plan: Return in about 6 months (around 06/01/2023), or if symptoms worsen or fail to improve, for medicare wellness visit.  Ria Bush, MD

## 2022-11-29 NOTE — Assessment & Plan Note (Signed)
Chronic, stable. Congratulated on great control to date. He is motivated to continue healthy diet and lifestyle choices.

## 2022-12-15 ENCOUNTER — Other Ambulatory Visit: Payer: Self-pay | Admitting: Family Medicine

## 2022-12-15 DIAGNOSIS — E118 Type 2 diabetes mellitus with unspecified complications: Secondary | ICD-10-CM

## 2022-12-16 ENCOUNTER — Encounter: Payer: Self-pay | Admitting: Family Medicine

## 2022-12-20 DIAGNOSIS — M13849 Other specified arthritis, unspecified hand: Secondary | ICD-10-CM | POA: Diagnosis not present

## 2022-12-20 DIAGNOSIS — M65341 Trigger finger, right ring finger: Secondary | ICD-10-CM | POA: Diagnosis not present

## 2022-12-20 DIAGNOSIS — M65342 Trigger finger, left ring finger: Secondary | ICD-10-CM | POA: Diagnosis not present

## 2022-12-20 DIAGNOSIS — R52 Pain, unspecified: Secondary | ICD-10-CM | POA: Diagnosis not present

## 2022-12-27 DIAGNOSIS — M65342 Trigger finger, left ring finger: Secondary | ICD-10-CM | POA: Diagnosis not present

## 2023-01-03 ENCOUNTER — Ambulatory Visit (INDEPENDENT_AMBULATORY_CARE_PROVIDER_SITE_OTHER): Payer: Medicare Other | Admitting: Adult Health

## 2023-01-10 DIAGNOSIS — M25641 Stiffness of right hand, not elsewhere classified: Secondary | ICD-10-CM | POA: Diagnosis not present

## 2023-05-06 DIAGNOSIS — E119 Type 2 diabetes mellitus without complications: Secondary | ICD-10-CM | POA: Diagnosis not present

## 2023-05-06 LAB — HM DIABETES EYE EXAM

## 2023-05-08 ENCOUNTER — Other Ambulatory Visit: Payer: Self-pay | Admitting: Family Medicine

## 2023-05-08 DIAGNOSIS — E1169 Type 2 diabetes mellitus with other specified complication: Secondary | ICD-10-CM

## 2023-05-12 ENCOUNTER — Encounter (INDEPENDENT_AMBULATORY_CARE_PROVIDER_SITE_OTHER): Payer: Self-pay

## 2023-05-26 ENCOUNTER — Other Ambulatory Visit: Payer: Self-pay | Admitting: Family Medicine

## 2023-05-26 DIAGNOSIS — Z125 Encounter for screening for malignant neoplasm of prostate: Secondary | ICD-10-CM

## 2023-05-26 DIAGNOSIS — E1169 Type 2 diabetes mellitus with other specified complication: Secondary | ICD-10-CM

## 2023-05-26 DIAGNOSIS — E559 Vitamin D deficiency, unspecified: Secondary | ICD-10-CM

## 2023-05-27 ENCOUNTER — Encounter: Payer: Self-pay | Admitting: Family Medicine

## 2023-05-27 DIAGNOSIS — Z23 Encounter for immunization: Secondary | ICD-10-CM | POA: Diagnosis not present

## 2023-05-27 NOTE — Telephone Encounter (Signed)
Updated pt's chart.  

## 2023-05-31 ENCOUNTER — Other Ambulatory Visit (INDEPENDENT_AMBULATORY_CARE_PROVIDER_SITE_OTHER): Payer: Medicare Other

## 2023-05-31 DIAGNOSIS — E785 Hyperlipidemia, unspecified: Secondary | ICD-10-CM | POA: Diagnosis not present

## 2023-05-31 DIAGNOSIS — E559 Vitamin D deficiency, unspecified: Secondary | ICD-10-CM | POA: Diagnosis not present

## 2023-05-31 DIAGNOSIS — Z125 Encounter for screening for malignant neoplasm of prostate: Secondary | ICD-10-CM | POA: Diagnosis not present

## 2023-05-31 DIAGNOSIS — E1169 Type 2 diabetes mellitus with other specified complication: Secondary | ICD-10-CM | POA: Diagnosis not present

## 2023-05-31 LAB — HEMOGLOBIN A1C: Hgb A1c MFr Bld: 6.1 % (ref 4.6–6.5)

## 2023-05-31 LAB — COMPREHENSIVE METABOLIC PANEL
ALT: 16 U/L (ref 0–53)
AST: 20 U/L (ref 0–37)
Albumin: 4.2 g/dL (ref 3.5–5.2)
Alkaline Phosphatase: 77 U/L (ref 39–117)
BUN: 22 mg/dL (ref 6–23)
CO2: 30 meq/L (ref 19–32)
Calcium: 9.1 mg/dL (ref 8.4–10.5)
Chloride: 100 meq/L (ref 96–112)
Creatinine, Ser: 1 mg/dL (ref 0.40–1.50)
GFR: 76.02 mL/min (ref >60.00)
Glucose, Bld: 93 mg/dL (ref 70–99)
Potassium: 4.2 meq/L (ref 3.5–5.1)
Sodium: 139 meq/L (ref 135–145)
Total Bilirubin: 1.2 mg/dL (ref 0.2–1.2)
Total Protein: 6.8 g/dL (ref 6.0–8.3)

## 2023-05-31 LAB — MICROALBUMIN / CREATININE URINE RATIO
Creatinine,U: 103.3 mg/dL
Microalb Creat Ratio: 10.5 mg/g (ref 0.0–30.0)
Microalb, Ur: 10.9 mg/dL — ABNORMAL HIGH (ref 0.0–1.9)

## 2023-05-31 LAB — LIPID PANEL
Cholesterol: 115 mg/dL (ref 0–200)
HDL: 46.3 mg/dL (ref >39.00)
LDL Cholesterol: 52 mg/dL (ref 0–99)
NonHDL: 69
Total CHOL/HDL Ratio: 2
Triglycerides: 83 mg/dL (ref 0.0–149.0)
VLDL: 16.6 mg/dL (ref 0.0–40.0)

## 2023-06-01 LAB — VITAMIN D 25 HYDROXY (VIT D DEFICIENCY, FRACTURES): VITD: 47.5 ng/mL (ref 30.00–100.00)

## 2023-06-01 LAB — VITAMIN B12: Vitamin B-12: 218 pg/mL (ref 211–911)

## 2023-06-01 LAB — PSA, MEDICARE: PSA: 1.35 ng/mL (ref 0.10–4.00)

## 2023-06-06 ENCOUNTER — Encounter: Payer: Self-pay | Admitting: Family Medicine

## 2023-06-06 ENCOUNTER — Ambulatory Visit (INDEPENDENT_AMBULATORY_CARE_PROVIDER_SITE_OTHER): Payer: Medicare Other | Admitting: Family Medicine

## 2023-06-06 VITALS — BP 128/62 | HR 62 | Temp 97.2°F | Ht 64.0 in | Wt 207.0 lb

## 2023-06-06 DIAGNOSIS — I700 Atherosclerosis of aorta: Secondary | ICD-10-CM

## 2023-06-06 DIAGNOSIS — E559 Vitamin D deficiency, unspecified: Secondary | ICD-10-CM

## 2023-06-06 DIAGNOSIS — Z1211 Encounter for screening for malignant neoplasm of colon: Secondary | ICD-10-CM | POA: Diagnosis not present

## 2023-06-06 DIAGNOSIS — E1169 Type 2 diabetes mellitus with other specified complication: Secondary | ICD-10-CM | POA: Diagnosis not present

## 2023-06-06 DIAGNOSIS — Z0000 Encounter for general adult medical examination without abnormal findings: Secondary | ICD-10-CM

## 2023-06-06 DIAGNOSIS — I10 Essential (primary) hypertension: Secondary | ICD-10-CM | POA: Diagnosis not present

## 2023-06-06 DIAGNOSIS — E538 Deficiency of other specified B group vitamins: Secondary | ICD-10-CM | POA: Insufficient documentation

## 2023-06-06 DIAGNOSIS — Z7189 Other specified counseling: Secondary | ICD-10-CM

## 2023-06-06 DIAGNOSIS — E785 Hyperlipidemia, unspecified: Secondary | ICD-10-CM | POA: Diagnosis not present

## 2023-06-06 DIAGNOSIS — R21 Rash and other nonspecific skin eruption: Secondary | ICD-10-CM | POA: Insufficient documentation

## 2023-06-06 MED ORDER — TRIAMCINOLONE ACETONIDE 0.1 % EX CREA: 1.0000 | TOPICAL_CREAM | Freq: Two times a day (BID) | CUTANEOUS | 0 refills | Status: DC

## 2023-06-06 MED ORDER — METFORMIN HCL 500 MG PO TABS: 500.0000 mg | ORAL_TABLET | Freq: Every day | ORAL | 4 refills | Status: DC

## 2023-06-06 MED ORDER — ATORVASTATIN CALCIUM 40 MG PO TABS: 40.0000 mg | ORAL_TABLET | Freq: Every day | ORAL | 4 refills | Status: DC

## 2023-06-06 MED ORDER — VITAMIN B-12 1000 MCG PO TABS: 1000.0000 ug | ORAL_TABLET | Freq: Every day | ORAL | Status: DC

## 2023-06-06 NOTE — Assessment & Plan Note (Signed)
Anticipate poison ivy dermatitis - Rx triamcinolone cream.

## 2023-06-06 NOTE — Assessment & Plan Note (Addendum)
Chronic, stable on low dose metformin.

## 2023-06-06 NOTE — Addendum Note (Signed)
Addended by: Eustaquio Boyden on: 06/06/2023 08:32 AM   Modules accepted: Orders

## 2023-06-06 NOTE — Assessment & Plan Note (Addendum)
Reviewed weight gain noted.  Discussed healthy diet and lifestyle to maintain weight control.  Obesity complicated by diabetes, OA, HTN, HLD, CAD.

## 2023-06-06 NOTE — Patient Instructions (Addendum)
Pass by lab to pick up stool kit Start b12 daily.  May use triamcinolone cream to itchy rash on left posterior knee.  Good to see you today  Return as needed or in 6 months for diabetes check.

## 2023-06-06 NOTE — Assessment & Plan Note (Signed)
Chronic, stable. Continue atorvastatin. The ASCVD Risk score (Arnett DK, et al., 2019) failed to calculate for the following reasons:   The valid total cholesterol range is 130 to 320 mg/dL d

## 2023-06-06 NOTE — Assessment & Plan Note (Signed)
Advanced directives received and scanned 05/2017. HCPOA are son Gaynelle AduRobbie then DIL Heather. [facility].

## 2023-06-06 NOTE — Assessment & Plan Note (Signed)
Continue aspirin, statin.  

## 2023-06-06 NOTE — Progress Notes (Addendum)
Ph: [phone] Fax: [phone]   Patient ID: Roger Mcdaniel, male    DOB: 06-01-1951, 73 y.o.   MRN: [id number]  This visit was conducted in person.  BP 128/62   Pulse 62   Temp (!) 97.2 F (36.2 C) (Temporal)   Ht 5\' 4"  (1.626 m)   Wt 207 lb (93.9 kg)   SpO2 99%   BMI 35.53 kg/m    CC: AMW Subjective:   HPI: Roger Mcdaniel is a 72 y.o. male presenting on 06/06/2023 for Annual Exam   Did not see health advisor this year.   Hearing Screening   500Hz  1000Hz  2000Hz  4000Hz   Right ear 20 40 20 Fail  Left ear 25 40 20 Fail  Vision Screening - Comments:: Reports last eye exam was 04/2023  Flowsheet Row Office Visit from 06/06/2023 in [facility]  PHQ-2 Total Score 0          06/06/2023    7:52 AM 11/29/2022    8:03 AM 05/24/2022    9:19 AM 05/22/2021   10:18 AM 04/04/2020    9:10 AM  Fall Risk   Falls in the past year? 0 0 1 0 0  Number falls in past yr: 0  0 0 0  Injury with Fall? 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 PFTs WNL. Notes persistent left chest discomfort when sneezing hard.    R total knee replacement ([place]) 11/2020.   Almost 30 lb weight gain in the past 6 months.  No longer seeing healthy weight [facility] wellness center  Red rash to left posterior knee present for the past 2 days. It is itchy. He's been push mowing lawn, may have gotten in poison ivy.   Preventative: Colon cancer screening - yearly stool kit. No h/o blood in stool - declines colonoscopy Prostate cancer screening - discussed.  Continues yearly PSA.  Lung cancer screening - not eligible  Flu shot - yearly COVID vaccine - Pfizer 09/2019, 10/2019, booster x2  05/2020, 12/2020, bivalent 05/2021, 01/2022, 04/2023 Td 2000, Tdap 2012  Prevnar-13 11/2017, pnuemovax-23 09/2019, 12/2020  RSV - discussed - will wait to 76 Shingrix - 01/2022, 03/2022 Advanced directives received and scanned 05/2017. HCPOA are son Gaynelle Adu then DIL Heather. Grants discretion to [facility] to make decisions for life-prolonging measures.  Seat belt use discussed  Sunscreen use discussed. No changing moles on skin.  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 Interim medical history since our last visit reviewed. Allergies and medications reviewed and updated. Outpatient Medications Prior to Visit  Medication Sig Dispense Refill  . Ascorbic Acid (VITAMIN C)  1000 MG tablet Take 1,000 mg by mouth daily. Every morning    . aspirin EC 81 MG tablet Take 1 tablet (81 mg total) by mouth daily. Swallow whole. 30 tablet 0  . Cholecalciferol (VITAMIN D3) 50 MCG (2000 UT) TABS Take 1 tablet by mouth daily. Every morning    . glucose blood (ONETOUCH ULTRA) test strip Use as instructed to check blood sugar once a day 100 strip 1  . Lancets (ONETOUCH DELICA PLUS LANCET33G) MISC Use to check sugars daily 100 each 2  . Magnesium 250 MG TABS Take 250 mg by mouth daily.    [name] zinc gluconate 50 MG tablet Take 50 mg by mouth daily.    [name] atorvastatin (LIPITOR) 40 MG tablet TAKE 1 TABLET BY MOUTH EVERY DAY 90 tablet 0  . metFORMIN (GLUCOPHAGE) 500 MG tablet TAKE 1 TABLET BY MOUTH EVERY DAY WITH BREAKFAST  90 tablet 0   No facility-administered medications prior to visit.     Per HPI unless specifically indicated in ROS section below Review of Systems  Objective:  BP 128/62   Pulse 62   Temp (!) 97.2 F (36.2 C) (Temporal)   Ht 5\' 4"  (1.626 m)   Wt 207 lb (93.9 kg)   SpO2 99%   BMI 35.53 kg/m   Wt Readings from Last 3 Encounters:  06/06/23 207 lb (93.9 kg)  11/29/22 189 lb 2 oz (85.8 kg)  11/08/22 179 lb (81.2 kg)      Physical Exam Vitals and nursing note reviewed.  Constitutional:      General: He is not in acute distress.    Appearance: Normal appearance. He is well-developed. He is not ill-appearing.  HENT:     Head: Normocephalic and atraumatic.     Right Ear: Hearing, tympanic membrane, ear canal and external ear normal.     Left Ear: Hearing, tympanic membrane, ear canal and external ear normal.     Nose: Nose normal.     Mouth/Throat:     Mouth: Mucous membranes are moist.     Pharynx: Oropharynx is clear. No oropharyngeal exudate or posterior oropharyngeal erythema.  Eyes:     General: No scleral icterus.    Extraocular Movements: Extraocular movements intact.     Conjunctiva/sclera: Conjunctivae normal.     Pupils: Pupils are equal, round, and reactive to light.  Neck:     Thyroid: No thyroid mass or thyromegaly.     Vascular: No carotid bruit.  Cardiovascular:     Rate and Rhythm: Normal rate and regular rhythm.     Pulses: Normal pulses.          Radial pulses are 2+ on the right side and 2+ on the left side.     Heart sounds: Normal heart sounds. No murmur heard. Pulmonary:     Effort: Pulmonary effort is normal. No respiratory distress.     Breath sounds: Normal breath sounds. No wheezing, rhonchi or rales.  Abdominal:     General: Bowel sounds are normal. There is no distension.     Palpations: Abdomen is soft. There is no mass.     Tenderness: There is no abdominal tenderness. There is no guarding or rebound.     Hernia: No hernia is present.   Musculoskeletal:        General: Normal range of motion.     Cervical back: Normal range of motion and neck supple.     Right lower leg: No edema.     Left  lower leg: No edema.  Lymphadenopathy:     Cervical: No cervical adenopathy.  Skin:    General: Skin is warm and dry.     Findings: Erythema and rash present.     Comments: Pruritic microvesicular rash to left popliteal region   Neurological:     General: No focal deficit present.     Mental Status: He is alert and oriented to person, place, and time.     Comments:  Recall 3/3  Calculation 5/5 DLROW  Psychiatric:        Mood and Affect: Mood normal.        Behavior: Behavior normal.        Thought Content: Thought content normal.        Judgment: Judgment normal.      Results for orders placed or performed in visit on 05/31/23  Vitamin B12  Result Value Ref Range   Vitamin B-12 218 211 - 911 pg/mL  PSA, Medicare  Result Value Ref Range   PSA 1.35 0.10 - 4.00 ng/ml  VITAMIN D 25 Hydroxy (Vit-D Deficiency, Fractures)  Result Value Ref Range   VITD 47.50 30.00 - 100.00 ng/mL  Comprehensive metabolic panel  Result Value Ref Range   Sodium 139 135 - 145 mEq/L   Potassium 4.2 3.5 - 5.1 mEq/L   Chloride 100 96 - 112 mEq/L   CO2 30 19 - 32 mEq/L   Glucose, Bld 93 70 - 99 mg/dL   BUN 22 6 - 23 mg/dL   Creatinine, Ser 8.11 0.40 - 1.50 mg/dL   Total Bilirubin 1.2 0.2 - 1.2 mg/dL   Alkaline Phosphatase 77 39 - 117 U/L   AST 20 0 - 37 U/L   ALT 16 0 - 53 U/L   Total Protein 6.8 6.0 - 8.3 g/dL   Albumin 4.2 3.5 - 5.2 g/dL   GFR 91.47 >82.95 mL/min   Calcium 9.1 8.4 - 10.5 mg/dL  Lipid panel  Result Value Ref Range   Cholesterol 115 0 - 200 mg/dL   Triglycerides 62.1 0.0 - 149.0 mg/dL   HDL 30.86 >57.84 mg/dL   VLDL 69.6 0.0 - 29.5 mg/dL   LDL Cholesterol 52 0 - 99 mg/dL   Total CHOL/HDL Ratio 2    NonHDL 69.00   Microalbumin / creatinine urine ratio  Result Value Ref Range   Microalb, Ur 10.9 (H) 0.0 - 1.9 mg/dL    Creatinine,U 284.1 mg/dL   Microalb Creat Ratio 10.5 0.0 - 30.0 mg/g  Hemoglobin A1c  Result Value Ref Range   Hgb A1c MFr Bld 6.1 4.6 - 6.5 %    Assessment & Plan:   Problem List Items Addressed This Visit     Advanced care planning/counseling discussion (Chronic)    Advanced directives received and scanned 05/2017. HCPOA are son Gaynelle Adu then DIL Heather. Grants discretion to [facility] to make decisions for life-prolonging measures.       Medicare annual wellness visit, subsequent - Primary (Chronic)    I have personally reviewed the Medicare Annual Wellness questionnaire and have noted 1. The patient's medical and social history 2. Their use of alcohol, tobacco or illicit drugs 3. Their current medications and supplements 4. The patient's functional ability including ADL's, fall risks, home safety risks and hearing or visual impairment. Cognitive function has been assessed and addressed as indicated.  5. Diet and physical activity 6. Evidence for depression or mood disorders The patients weight, height, BMI have been recorded in the chart. I have  made referrals, counseling and provided education to the patient based on review of the above and I have provided the pt with a written personalized care plan for preventive services. Provider list updated.. See scanned questionairre as needed for further documentation. Reviewed preventative protocols and updated unless pt declined.       Hyperlipidemia associated with type 2 diabetes mellitus (HCC)    Chronic, stable. Continue atorvastatin. The ASCVD Risk score (Arnett DK, et al., 2019) failed to calculate for the following reasons:   The valid total cholesterol range is 130 to 320 mg/dL d      Relevant Medications   atorvastatin (LIPITOR) 40 MG tablet   metFORMIN (GLUCOPHAGE) 500 MG tablet   Essential hypertension, benign    Chronic, stable off medication.       Relevant Medications   atorvastatin (LIPITOR) 40 MG tablet   Severe  obesity (BMI 35.0-39.9) with comorbidity (HCC)    Reviewed weight gain noted.  Discussed healthy diet and lifestyle to maintain weight control.  Obesity complicated by diabetes, OA, HTN, HLD, CAD.       Relevant Medications   metFORMIN (GLUCOPHAGE) 500 MG tablet   Type 2 diabetes mellitus with other specified complication (HCC)    Chronic, stable on low dose metformin.       Relevant Medications   atorvastatin (LIPITOR) 40 MG tablet   metFORMIN (GLUCOPHAGE) 500 MG tablet   Atherosclerosis of aorta (HCC)    Continue aspirin, statin.       Relevant Medications   atorvastatin (LIPITOR) 40 MG tablet   Vitamin D deficiency    Continue 2000 international units  daily .      Low serum vitamin B12    Rec start b12 daily OTC.       Skin rash    Anticipate poison ivy dermatitis - Rx triamcinolone cream.       Other Visit Diagnoses     Special screening for malignant neoplasms, colon       Relevant Orders   Fecal occult blood, imunochemical        Meds ordered this encounter  Medications  . triamcinolone cream (KENALOG) 0.1 %    Sig: Apply 1 Application topically 2 (two) times daily. Apply to AA.    Dispense:  30 g    Refill:  0  . atorvastatin (LIPITOR) 40 MG tablet    Sig: Take 1 tablet (40 mg total) by mouth daily.    Dispense:  90 tablet    Refill:  4  . metFORMIN (GLUCOPHAGE) 500 MG tablet    Sig: Take 1 tablet (500 mg total) by mouth daily with breakfast.    Dispense:  90 tablet    Refill:  4  . cyanocobalamin (VITAMIN B12) 1000 MCG tablet    Sig: Take 1 tablet (1,000 mcg total) by mouth daily.    Orders Placed This Encounter  Procedures  . Fecal occult blood, imunochemical    Standing Status:   Future    Standing Expiration Date:   06/05/2024    Patient Instructions  Pass by lab to pick up stool kit Start b12 daily.  May use triamcinolone cream to itchy rash on left posterior knee.  Good to see you today  Return as needed or in 6  months for diabetes check.   Follow up plan: Return in about 6 months (around 12/04/2023) for follow up visit.  Eustaquio Boyden, MD

## 2023-06-06 NOTE — Assessment & Plan Note (Signed)

## 2023-06-06 NOTE — Assessment & Plan Note (Addendum)
Chronic, stable off medication.  

## 2023-06-06 NOTE — Assessment & Plan Note (Signed)
Rec start b12 daily OTC.

## 2023-06-06 NOTE — Assessment & Plan Note (Signed)
Continue 2000 international units daily

## 2023-06-10 ENCOUNTER — Other Ambulatory Visit: Payer: Self-pay

## 2023-06-10 DIAGNOSIS — Z1211 Encounter for screening for malignant neoplasm of colon: Secondary | ICD-10-CM

## 2023-06-10 LAB — FECAL OCCULT BLOOD, IMMUNOCHEMICAL: Fecal Occult Bld: NEGATIVE

## 2023-07-01 ENCOUNTER — Other Ambulatory Visit: Payer: Self-pay | Admitting: Family Medicine

## 2023-07-02 ENCOUNTER — Other Ambulatory Visit: Payer: Self-pay | Admitting: Family Medicine

## 2023-07-02 DIAGNOSIS — E118 Type 2 diabetes mellitus with unspecified complications: Secondary | ICD-10-CM

## 2023-07-06 ENCOUNTER — Other Ambulatory Visit: Payer: Self-pay | Admitting: Family Medicine

## 2023-09-02 DIAGNOSIS — M25512 Pain in left shoulder: Secondary | ICD-10-CM | POA: Diagnosis not present

## 2023-10-11 ENCOUNTER — Encounter: Payer: Self-pay | Admitting: Family Medicine

## 2023-10-11 DIAGNOSIS — I10 Essential (primary) hypertension: Secondary | ICD-10-CM

## 2023-10-11 MED ORDER — LISINOPRIL 20 MG PO TABS: 20.0000 mg | ORAL_TABLET | Freq: Every day | ORAL | 6 refills | Status: DC

## 2023-10-28 ENCOUNTER — Other Ambulatory Visit (INDEPENDENT_AMBULATORY_CARE_PROVIDER_SITE_OTHER): Payer: Medicare Other

## 2023-10-28 DIAGNOSIS — I10 Essential (primary) hypertension: Secondary | ICD-10-CM | POA: Diagnosis not present

## 2023-10-28 LAB — BASIC METABOLIC PANEL
BUN: 22 mg/dL (ref 6–23)
CO2: 29 meq/L (ref 19–32)
Calcium: 9 mg/dL (ref 8.4–10.5)
Chloride: 101 meq/L (ref 96–112)
Creatinine, Ser: 1.01 mg/dL (ref 0.40–1.50)
GFR: 74.9 mL/min (ref >60.00)
Glucose, Bld: 104 mg/dL — ABNORMAL HIGH (ref 70–99)
Potassium: 4.7 meq/L (ref 3.5–5.1)
Sodium: 138 meq/L (ref 135–145)

## 2023-10-31 ENCOUNTER — Encounter: Payer: Self-pay | Admitting: Family Medicine

## 2023-12-05 ENCOUNTER — Ambulatory Visit: Payer: Medicare Other | Admitting: Family Medicine

## 2023-12-09 ENCOUNTER — Encounter: Payer: Self-pay | Admitting: Family Medicine

## 2023-12-09 ENCOUNTER — Ambulatory Visit: Payer: Medicare Other | Admitting: Family Medicine

## 2023-12-09 VITALS — BP 135/80 | HR 68 | Temp 98.5°F | Ht 64.0 in | Wt 219.5 lb

## 2023-12-09 DIAGNOSIS — E1169 Type 2 diabetes mellitus with other specified complication: Secondary | ICD-10-CM | POA: Diagnosis not present

## 2023-12-09 DIAGNOSIS — Z7984 Long term (current) use of oral hypoglycemic drugs: Secondary | ICD-10-CM | POA: Diagnosis not present

## 2023-12-09 DIAGNOSIS — I10 Essential (primary) hypertension: Secondary | ICD-10-CM

## 2023-12-09 DIAGNOSIS — I2510 Atherosclerotic heart disease of native coronary artery without angina pectoris: Secondary | ICD-10-CM

## 2023-12-09 DIAGNOSIS — Z1283 Encounter for screening for malignant neoplasm of skin: Secondary | ICD-10-CM

## 2023-12-09 LAB — POCT GLYCOSYLATED HEMOGLOBIN (HGB A1C): Hemoglobin A1C: 6.5 % — AB (ref 4.0–5.6)

## 2023-12-09 LAB — MICROALBUMIN / CREATININE URINE RATIO
Creatinine,U: 88.8 mg/dL
Microalb Creat Ratio: 193.2 mg/g — ABNORMAL HIGH (ref 0.0–30.0)
Microalb, Ur: 17.2 mg/dL — ABNORMAL HIGH (ref 0.0–1.9)

## 2023-12-09 NOTE — Assessment & Plan Note (Addendum)
 Chronic, deteriorated control with weight gain noted however diabetes remains in controlled range with A1c at 6.5% Continue metformin 500mg  daily. Discussed LB lab error - will repeat Umicroalb/cr ratio no charge today.  Discussed possible addition of SGLT2i pending Umicroal/cr ratio as well as mechanism of action of medication and possible side effects/adverse effects to monitor for.

## 2023-12-09 NOTE — Assessment & Plan Note (Signed)
 Continue aspirin, statin. Upcoming appt with cards next week.

## 2023-12-09 NOTE — Patient Instructions (Addendum)
 Diabetes remains controlled.  Urine test today  Continue current medicines.  We will refer you to dermatology for skin cancer screening.  We may consider additional medicine like Jardiance or [place]

## 2023-12-09 NOTE — Assessment & Plan Note (Signed)
 30 lb weight gain noted over the past year, 12 lbs in 6 months.  Discussed with patient. Encouraged healthy diet and lifestyle choices to affect sustainable weight loss. Could consider GLP1RA.  Obesity complicated by diabetes, HTN, CAD, OA.

## 2023-12-09 NOTE — Progress Notes (Signed)
 Ph: [phone] Fax: [phone]   Patient ID: Roger Mcdaniel, male    DOB: [birth year], 73 y.o.   MRN: [id number]  This visit was conducted in person.  BP 135/80 (BP Location: Right Arm, Cuff Size: Large)   Pulse 68   Temp 98.5 F (36.9 C) (Oral)   Ht 5\' 4"  (1.626 m)   Wt 219 lb 8 oz (99.6 kg)   SpO2 97%   BMI 37.68 kg/m   156/64 on recheck 135/80 at home this morning   CC: 6 mo DM f/u visit  Subjective:   HPI: Roger Mcdaniel is a 73 y.o. 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 Using E-bike regularly. Mowing season is about to start (push-mower).   Sister just diagnosed with melanoma.   Upcoming appt with cardiology Dr Elease Hashimoto next week.   Discussed recently discovered [facility] laboratory miscalculation - the UACR calculation in the software of the system was incorrect but the absolute levels of microalbumin and creatinine were correct. Lab Results  Component Value Date   MICROALBUR 10.9 (H) 05/31/2023   MICROALBUR 4.3 (H) 05/17/2022  Ratio 23 (04/2022).  Ratio 105 (05/2023).   DM - does regularly check sugars 102 this morning. Weight gain noted - 12 lbs since last visit, 30 lbs since 11/2022. Compliant with antihyperglycemic regimen which includes: metformin 500mg  daily. Denies low sugars or hypoglycemic symptoms. Denies paresthesias, blurry vision. Last diabetic eye exam 04/2023. Glucometer brand: OneTouch Ultra. Last foot exam: 11/2022 - DUE. DSME: completed remotely [facility] pharmacy). Lab Results  Component Value Date   HGBA1C 6.5 (A) 12/09/2023   Diabetic Foot Exam - Simple   Simple Foot Form Diabetic Foot exam was performed with the following findings: Yes 12/09/2023  7:38 AM  Visual Inspection No deformities, no ulcerations, no other skin breakdown bilaterally: Yes Sensation Testing Intact to touch and monofilament  testing bilaterally: Yes Pulse Check Posterior Tibialis and Dorsalis pulse intact bilaterally: Yes Comments No claudication 2+ DP bilaterally L great toenail with onychomycotic changes    Lab Results  Component Value Date   MICROALBUR 10.9 (H) 05/31/2023    HTN - Compliant with current antihypertensive regimen of lisinopril 20mg  daily.  Does check blood pressures at home: 135/80 this morning. No low blood pressure readings or symptoms of dizziness/syncope. Denies HA, vision changes, CP/tightness, SOB, leg swelling.    He was started on meloxicam 15mg  daily by ortho for shoulder pain - he stopped this 10/2023.      Relevant past medical, surgical, family and social history reviewed and updated as indicated. Interim medical history since our last visit reviewed. Allergies and medications reviewed and updated. Outpatient Medications Prior to Visit  Medication Sig Dispense Refill   Ascorbic Acid (VITAMIN C) 1000 MG tablet Take 1,000 mg by mouth daily. Every morning     aspirin EC 81 MG tablet Take 1 tablet (81 mg total) by mouth daily. Swallow whole. 30 tablet 0   atorvastatin (LIPITOR) 40 MG tablet Take 1 tablet (40 mg total) by mouth daily. 90 tablet 4   Cholecalciferol (VITAMIN D3) 50 MCG (2000 UT) TABS Take 1 tablet by mouth daily. Every morning     cyanocobalamin (VITAMIN B12) 1000 MCG tablet Take 1 tablet (1,000 mcg total) by mouth daily.     Lancets (ONETOUCH DELICA PLUS LANCET33G) MISC CHECK SUGAR ONCE DAILY DX E11.9  100 each 2   lisinopril (ZESTRIL) 20 MG tablet Take 1 tablet (20 mg total) by mouth daily. 30 tablet 6   Magnesium 250 MG TABS Take 250 mg by mouth daily.     metFORMIN (GLUCOPHAGE) 500 MG tablet Take 1 tablet (500 mg total) by mouth daily with breakfast. 90 tablet 4   ONETOUCH ULTRA test strip USE AS INSTRUCTED TO CHECK BLOOD SUGAR ONCE A DAY 100 strip 1   zinc gluconate 50 MG tablet Take 50 mg by mouth daily.     triamcinolone cream (KENALOG) 0.1 % Apply 1  Application topically 2 (two) times daily. Apply to AA. 30 g 0   No facility-administered medications prior to visit.     Per HPI unless specifically indicated in ROS section below Review of Systems  Objective:  BP 135/80 (BP Location: Right Arm, Cuff Size: Large)   Pulse 68   Temp 98.5 F (36.9 C) (Oral)   Ht 5\' 4"  (1.626 m)   Wt 219 lb 8 oz (99.6 kg)   SpO2 97%   BMI 37.68 kg/m   Wt Readings from Last 3 Encounters:  12/09/23 219 lb 8 oz (99.6 kg)  06/06/23 207 lb (93.9 kg)  11/29/22 189 lb 2 oz (85.8 kg)      Physical Exam Vitals and nursing note reviewed.  Constitutional:      Appearance: Normal appearance. He is not ill-appearing.  Eyes:     Extraocular Movements: Extraocular movements intact.     Conjunctiva/sclera: Conjunctivae normal.     Pupils: Pupils are equal, round, and reactive to light.  Cardiovascular:     Rate and Rhythm: Normal rate and regular rhythm.     Pulses: Normal pulses.     Heart sounds: Normal heart sounds. No murmur heard. Pulmonary:     Effort: Pulmonary effort is normal. No respiratory distress.     Breath sounds: Normal breath sounds. No wheezing, rhonchi or rales.  Musculoskeletal:     Right lower leg: No edema.     Left lower leg: No edema.     Comments: See HPI for foot exam if done  Skin:    General: Skin is warm and dry.     Findings: No rash.  Neurological:     Mental Status: He is alert.  Psychiatric:        Mood and Affect: Mood normal.        Behavior: Behavior normal.       Results for orders placed or performed in visit on 12/09/23  POCT glycosylated hemoglobin (Hb A1C)   Collection Time: 12/09/23  7:30 AM  Result Value Ref Range   Hemoglobin A1C 6.5 (A) 4.0 - 5.6 %   HbA1c POC (<> result, manual entry)     HbA1c, POC (prediabetic range)     HbA1c, POC (controlled diabetic range)      Assessment & Plan:   Problem List Items Addressed This Visit     Essential hypertension, benign   Chronic, stable. Continue  lisinopril 20mg  daily.  Will use home readings to titrate bp meds       Severe obesity (BMI 35.0-39.9) with comorbidity (HCC)   30 lb weight gain noted over the past year, 12 lbs in 6 months.  Discussed with patient. Encouraged healthy diet and lifestyle choices to affect sustainable weight loss. Could consider GLP1RA.  Obesity complicated by diabetes, HTN, CAD, OA.       Type 2 diabetes mellitus with other specified complication (HCC) -  Primary   Chronic, deteriorated control with weight gain noted however diabetes remains in controlled range with A1c at 6.5% Continue metformin 500mg  daily. Discussed LB lab error - will repeat Umicroalb/cr ratio no charge today.  Discussed possible addition of SGLT2i pending Umicroal/cr ratio as well as mechanism of action of medication and possible side effects/adverse effects to monitor for.       Relevant Orders   POCT glycosylated hemoglobin (Hb A1C) (Completed)   Microalbumin / creatinine urine ratio   CAD (coronary artery disease)   Continue aspirin, statin. Upcoming appt with cards next week.       Other Visit Diagnoses       Skin cancer screening       Relevant Orders   Ambulatory referral to Dermatology        No orders of the defined types were placed in this encounter.   Orders Placed This Encounter  Procedures   Microalbumin / creatinine urine ratio   Ambulatory referral to Dermatology    Referral Priority:   Routine    Referral Type:   Consultation    Referral Reason:   Specialty Services Required    Requested Specialty:   Dermatology    Number of Visits Requested:   1   POCT glycosylated hemoglobin (Hb A1C)    Patient Instructions  Diabetes remains controlled.  Urine test today  Continue current medicines.  We will refer you to dermatology for skin cancer screening.  We may consider additional medicine like Jardiance or Farxiga   Follow up plan: Return in about 6 months (around 06/10/2024), or if symptoms worsen  or fail to improve, for annual exam, prior fasting for blood work, medicare wellness visit.  Eustaquio Boyden, MD

## 2023-12-09 NOTE — Assessment & Plan Note (Signed)
 Chronic, stable. Continue lisinopril 20mg  daily.  Will use home readings to titrate bp meds

## 2023-12-12 ENCOUNTER — Encounter: Payer: Self-pay | Admitting: Family Medicine

## 2023-12-12 ENCOUNTER — Other Ambulatory Visit: Payer: Self-pay | Admitting: Family Medicine

## 2023-12-12 MED ORDER — DAPAGLIFLOZIN PROPANEDIOL 5 MG PO TABS: 5.0000 mg | ORAL_TABLET | Freq: Every day | ORAL | 3 refills | Status: DC

## 2023-12-15 NOTE — Progress Notes (Unsigned)
 Cardiology Office Note   Date:  12/16/2023   ID:  Roger Mcdaniel, Roger Mcdaniel [birth year], MRN [id number]  PCP:  Eustaquio Boyden, MD  Cardiologist:   Kristeen Miss, MD   Chief Complaint  Patient presents with   Coronary Artery Disease        Hypertension        Hyperlipidemia   1. Hypertension 2. Obesity 3. Obstructive sleep apnea   Notes prior to 2014   Roger Mcdaniel is a 73 year old gentleman with a history of hypertension and sleep apnea. I saw him approximately one month ago for hypertension. He has been gradually titrating his medications up and he still had episodes of hypertension. He's been limiting his salt intake. He denies any chest pain or shortness of breath.  He's been taking care of his granddaughter for the past 6 months. As result he's gained a lot of weight. He's not been able to exercise.  He was tried on Pravachol. He had lots of muscle aches and pains and had to stop the Pravachol. His muscle aches and pains have improved.  Feb 09, 2013:  Doing well from a cardiac standpoint.  He had some generalized aches that lasted 3 days.  Associated with some stomach issues.  Feb. 23, 2015:  Roger Mcdaniel is feeling well. He has been having some episodes of lightheadedness and has been found to have orthostatic hypotension. One is his 76 yo cousins diet this past week of a sudden heart attack.     He has had some left shoulder / rotator cuff difficulities.   He has had some leg swelling.  He has not been exercising much.    06/28/2014:  Roger Mcdaniel is doing ok.  Had neck surgery since I have seen him.   He has taken a new job - much less stress. He has lost > 10 lbs.     December 19, 2014:  Roger Mcdaniel is a 73 y.o. male who presents for follow up of his HTN Doing much better with his new job.  Exercising , walking regulary.   December 23, 2015:  Roger Mcdaniel is seen back for a 1 year visit. His wife died after leg surgery a year Has gained some weight  Has been diagnosed with DM type 2.    Has been going to diabetes classes and has lost some weight. Using the stairs instead of the elevator .    Jan. 16, 2023: Roger Mcdaniel is seen back for follow up visit  Was seen 5 years ago  Larey Seat on the left side of his chest Nov. 11.   Developed left sided chest pain on Dec. 22 Called nurse ,   called EMS  Went to the er , BP was elevated , Troponin was negative , work up in [employer] was negative .  Was given script for NTG .  CRR shows partial collaspse of the left lower lobe with progression of the left hemidiaphragm elevation  CT showed the L pneumothorax  Also showed extensive CAC     Pain is not worsened with deep breath The discomfort has generally improved.  BP at home is generally improved.  We did a stress test years ago .   LDL is 45.    Feb. 9, 2024 Roger Mcdaniel is seen for a 1 year follow up of his HLD, chest pain  He had a myoview last year for evaluation of his CP  Myoview from jan. 17, 2023 was low risk, no ischemia, no infarction Wife [id number]  passed away about 8 years ago .  BP has been good at home  No CP , no dyspnea .  Last week went to [place].  Labs from Aug. 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 Elease Hashimoto)   History of kidney stones    HNP (herniated nucleus pulposus), cervical 12/20/2013    Hyperlipidemia 09/2003   Hypertension 06/2004   Joint pain    Obesity    OSA (obstructive sleep apnea)    was on CPAP, not used  in 25-30 years    Pre-diabetes    SOB (shortness of breath)    Swallowing difficulty     Past Surgical History:  Procedure Laterality Date   ANTERIOR CERVICAL DECOMP/DISCECTOMY FUSION N/A 01/14/2014   Procedure: ANTERIOR CERVICAL DECOMPRESSION/DISCECTOMY FUSION CERVICAL FOUR-FIVE,CERVICAL FIVE-SIX ;  Surgeon: Hewitt Shorts, MD   BRONCHIAL WASHINGS Left 11/05/2021   Procedure: BRONCHIAL WASHINGS;  Surgeon: Josephine Igo, DO;  Location: [facility];  Service: Pulmonary;  Laterality: Left;   CATARACT EXTRACTION Right 02/28/2019   Per patient   CATARACT EXTRACTION Left 03/12/2019   Per patient   CYSTOSCOPY/URETEROSCOPY/HOLMIUM LASER/STENT PLACEMENT Right 05/28/2020   Procedure: CYSTOSCOPY RIGHT URETEROSCOPY/HOLMIUM LASER/STENT PLACEMENT STONE BASKET RETRACTION;  Surgeon: Crista Elliot, MD;  Location: [facility];  Service: Urology;  Laterality: Right;   EXTRACORPOREAL SHOCK WAVE LITHOTRIPSY Right 03/17/2020   Procedure: RIGHT EXTRACORPOREAL SHOCK WAVE LITHOTRIPSY (ESWL);  Surgeon: Rene Paci, MD;  Location: [facility];  Service: Urology;  Laterality: Right;   FLEXIBLE BRONCHOSCOPY  10/2021   Flexible video fiberoptic bronchoscopy and biopsies for partially collapsed L lung (Icard)   KNEE ARTHROPLASTY Right 11/27/2020   Procedure: COMPUTER ASSISTED TOTAL KNEE ARTHROPLASTY;  Surgeon: Samson Frederic, MD;  Location: [facility];  Service: Orthopedics;  Laterality: Right;   ORIF ANKLE FRACTURE  1980   right   RETINAL LASER PROCEDURE Right 03/14/2019   Per patient   SEPTOPLASTY  2005   for sleep apnea //cpap   SHOULDER SURGERY Right 06/2017   Dr Rennis Chris at [facility]   VIDEO BRONCHOSCOPY Bilateral 11/05/2021   Procedure: VIDEO BRONCHOSCOPY WITHOUT FLUORO;  Surgeon: Josephine Igo, DO;  Location: [facility];   Service: Pulmonary;  Laterality: Bilateral;  Cytology not needed   WRIST ARTHROSCOPY WITH CARPOMETACARPEL (CMC) ARTHROPLASTY Right 11/2018   R CMC and R MCP arthroplasty (Gramig)     Current Outpatient Medications  Medication Sig Dispense Refill   Ascorbic Acid (VITAMIN C) 1000 MG tablet Take 1,000 mg by mouth daily. Every morning  aspirin EC 81 MG tablet Take 1 tablet (81 mg total) by mouth daily. Swallow whole. 30 tablet 0   atorvastatin (LIPITOR) 40 MG tablet Take 1 tablet (40 mg total) by mouth daily. 90 tablet 4   Cholecalciferol (VITAMIN D3) 50 MCG (2000 UT) TABS Take 1 tablet by mouth daily. Every morning     cyanocobalamin (VITAMIN B12) 1000 MCG tablet Take 1 tablet (1,000 mcg total) by mouth daily.     Lancets (ONETOUCH DELICA PLUS LANCET33G) MISC CHECK SUGAR ONCE DAILY DX E11.9 100 each 2   lisinopril (ZESTRIL) 20 MG tablet Take 1 tablet (20 mg total) by mouth daily. 30 tablet 6   Magnesium 250 MG TABS Take 250 mg by mouth daily.     metFORMIN (GLUCOPHAGE) 500 MG tablet Take 1 tablet (500 mg total) by mouth daily with breakfast. 90 tablet 4   ONETOUCH ULTRA test strip USE AS INSTRUCTED TO CHECK BLOOD SUGAR ONCE A DAY 100 strip 1   zinc gluconate 50 MG tablet Take 50 mg by mouth daily.     dapagliflozin propanediol (FARXIGA) 5 MG TABS tablet Take 1 tablet (5 mg total) by mouth daily before breakfast. (Patient not taking: Reported on 12/16/2023) 30 tablet 3   No current facility-administered medications for this visit.    Allergies:   Codeine sulfate and Pravachol [pravastatin sodium]    Social History:  The patient  reports that he has never smoked. He has never used smokeless tobacco. He reports that he does not currently use alcohol after a past usage of about 1.0 standard drink of alcohol per week. He reports that he does not use drugs.   Family History:  The patient's family history includes CVA in his mother; Cancer in his mother and another family member; Heart disease  in his father and paternal grandfather; Hypertension in his brother; Obesity in his brother.    ROS:  Please see the history of present illness.      All other systems are reviewed and negative.    Physical Exam: Blood pressure 139/82, pulse 66, height 5\' 4"  (1.626 m), weight 222 lb (100.7 kg), SpO2 96%.       GEN:  mildly obese male,  in no acute distress HEENT: Normal NECK: No JVD; No carotid bruits LYMPHATICS: No lymphadenopathy CARDIAC: RRR , no murmurs, rubs, gallops RESPIRATORY:  Clear to auscultation without rales, wheezing or rhonchi  ABDOMEN: Soft, non-tender, non-distended MUSCULOSKELETAL:  No edema; No deformity  SKIN: Warm and dry NEUROLOGIC:  Alert and oriented x 3     EKG:     EKG Interpretation Date/Time:  Friday December 16 2023 08:23:22 EDT Ventricular Rate:  64 PR Interval:  166 QRS Duration:  90 QT Interval:  434 QTC Calculation: 447 R Axis:   -38  Text Interpretation: Normal sinus rhythm Left axis deviation Possible Lateral infarct (cited on or before 16-Dec-2023) When compared with ECG of 16-Dec-2023 07:55, less artifact,   No significant change since last tracing Confirmed by Kristeen Miss ([id number]) on 12/16/2023 8:44:42 AM       Recent Labs: 05/31/2023: ALT 16 10/28/2023: BUN 22; Creatinine, Ser 1.01; Potassium 4.7; Sodium 138    Lipid Panel    Component Value Date/Time   CHOL 115 05/31/2023 0719   CHOL 129 11/08/2022 0814   TRIG 83.0 05/31/2023 0719   TRIG 107 04/02/2013 0739   HDL 46.30 05/31/2023 0719   HDL 53 11/08/2022 0814   HDL 38 (L) 04/02/2013 0739   CHOLHDL 2  05/31/2023 0719   VLDL 16.6 05/31/2023 0719   LDLCALC 52 05/31/2023 0719   LDLCALC 63 11/08/2022 0814   LDLCALC 55 04/02/2013 0739   LDLDIRECT 143.5 04/27/2011 0816      Wt Readings from Last 3 Encounters:  12/16/23 222 lb (100.7 kg)  12/09/23 219 lb 8 oz (99.6 kg)  06/06/23 207 lb (93.9 kg)      Other studies Reviewed: Additional studies/ records that were  reviewed today include: . Review of the above records demonstrates:    ASSESSMENT AND PLAN:  1. Hypertension -    BP is stable,  continue current meds.    2. Obesity -     advised more exercise, cutting back on calories  Weight loss    3.  Pneumothorax/chest pain :      4. Hyperglycemia:   has developed proteinuria .   His primary started [place] - cannot afford it      5. Hyperlipidemia:    lipids look good . LDL is 52    Current medicines are reviewed at length with the patient today.  The patient does not have concerns regarding medicines.  The following changes have been made:  no change  Labs/ tests ordered today include:   Orders Placed This Encounter  Procedures   EKG 12-Lead     Disposition:      follow up with [place] in a year     Signed, Kristeen Miss, MD  12/16/2023 8:13 AM    [facility] [address] [place], [place], [place]  [address] Phone: [phone]; Fax: [phone]

## 2023-12-16 ENCOUNTER — Ambulatory Visit: Payer: Medicare Other | Attending: Cardiovascular Disease | Admitting: Cardiovascular Disease

## 2023-12-16 ENCOUNTER — Encounter: Payer: Self-pay | Admitting: Cardiovascular Disease

## 2023-12-16 VITALS — BP 139/82 | HR 66 | Ht 64.0 in | Wt 222.0 lb

## 2023-12-16 DIAGNOSIS — I10 Essential (primary) hypertension: Secondary | ICD-10-CM | POA: Insufficient documentation

## 2023-12-16 NOTE — Patient Instructions (Signed)

## 2023-12-19 ENCOUNTER — Other Ambulatory Visit (HOSPITAL_COMMUNITY): Payer: Self-pay

## 2023-12-26 ENCOUNTER — Other Ambulatory Visit (HOSPITAL_COMMUNITY): Payer: Self-pay

## 2023-12-27 ENCOUNTER — Telehealth: Payer: Self-pay

## 2023-12-27 ENCOUNTER — Other Ambulatory Visit (HOSPITAL_COMMUNITY): Payer: Self-pay

## 2023-12-27 NOTE — Telephone Encounter (Signed)
 Pharmacy Patient Advocate Encounter  Received notification from  [facility]  that Prior Authorization for Farxiga 5 mg tablets has been APPROVED from 12/27/23 to 12/26/24. Spoke to pharmacy to process.Copay is $500.    PA #/Case ID/Reference #: --   *copay is very high, insurance will not pay for the generic, please consider changing therapies

## 2023-12-29 ENCOUNTER — Encounter: Payer: Self-pay | Admitting: Family Medicine

## 2023-12-29 NOTE — Telephone Encounter (Signed)
 Received faxed follow up form for [facility] requesting additional info and offering alternative covered drugs on the formulary.   Placed form in Dr Timoteo Expose box.

## 2023-12-31 ENCOUNTER — Encounter: Payer: Self-pay | Admitting: Family Medicine

## 2024-01-02 ENCOUNTER — Telehealth: Payer: Self-pay | Admitting: Family Medicine

## 2024-01-02 NOTE — Telephone Encounter (Signed)
 Plz schedule fasting lab visit the week prior to pt's 06/15/24 CPE.

## 2024-01-02 NOTE — Telephone Encounter (Signed)
 Copied from CRM [id number]. Topic: Clinical - Request for Lab/Test Order >> Jan 02, 2024 10:25 AM Drema Balzarine wrote: Reason for CRM: Patient scheduled lab appointment prior to physical 06/08/24 and needs lab orders put in

## 2024-01-02 NOTE — Telephone Encounter (Signed)
 Noted.

## 2024-01-02 NOTE — Telephone Encounter (Signed)
 Patient scheduled.

## 2024-01-02 NOTE — Telephone Encounter (Signed)
 I will order closer to the date.

## 2024-01-10 ENCOUNTER — Encounter: Payer: Self-pay | Admitting: Family Medicine

## 2024-01-10 DIAGNOSIS — Z808 Family history of malignant neoplasm of other organs or systems: Secondary | ICD-10-CM

## 2024-01-10 DIAGNOSIS — Z1283 Encounter for screening for malignant neoplasm of skin: Secondary | ICD-10-CM

## 2024-01-24 ENCOUNTER — Other Ambulatory Visit: Payer: Self-pay | Admitting: Family Medicine

## 2024-01-24 DIAGNOSIS — E118 Type 2 diabetes mellitus with unspecified complications: Secondary | ICD-10-CM

## 2024-02-17 ENCOUNTER — Telehealth: Payer: Self-pay | Admitting: Family Medicine

## 2024-02-17 ENCOUNTER — Encounter: Payer: Self-pay | Admitting: Family Medicine

## 2024-02-17 DIAGNOSIS — L089 Local infection of the skin and subcutaneous tissue, unspecified: Secondary | ICD-10-CM | POA: Diagnosis not present

## 2024-02-17 NOTE — Telephone Encounter (Signed)
 Copied from CRM [id number]. Topic: Appointments - Scheduling Inquiry for Clinic >> Feb 17, 2024  9:08 AM Baldomero Bone wrote: Reason for CRM: Patient fell a week ago, ortho doctor said no break but infected. Prescribed patient cephalexin 500 MG capsules and advised to follow up with PCP. Patient wants to be seen on 02/24/2024 but no appointments available until the end June with Dr Mariam Shingles. If Dr Crissie Dome cannot see the patient, as a last resort, he will see anyone available. Callback number is [phone]

## 2024-02-17 NOTE — Telephone Encounter (Signed)
 Pt got scheduled for Tues 2pm

## 2024-02-17 NOTE — Telephone Encounter (Signed)
 Looks like pt called back and was scheduled on 02/21/24.

## 2024-02-17 NOTE — Telephone Encounter (Signed)
 Lvm asking pt to call back. Wanted to offer UC f/u on 02/21/24 at 2:00, if still slot is still available.

## 2024-02-21 ENCOUNTER — Encounter: Payer: Self-pay | Admitting: Family Medicine

## 2024-02-21 ENCOUNTER — Ambulatory Visit (INDEPENDENT_AMBULATORY_CARE_PROVIDER_SITE_OTHER): Admitting: Family Medicine

## 2024-02-21 VITALS — BP 134/64 | HR 69 | Temp 98.1°F | Ht 64.0 in | Wt 210.4 lb

## 2024-02-21 DIAGNOSIS — E1169 Type 2 diabetes mellitus with other specified complication: Secondary | ICD-10-CM | POA: Diagnosis not present

## 2024-02-21 DIAGNOSIS — L03115 Cellulitis of right lower limb: Secondary | ICD-10-CM | POA: Diagnosis not present

## 2024-02-21 DIAGNOSIS — Z7984 Long term (current) use of oral hypoglycemic drugs: Secondary | ICD-10-CM | POA: Diagnosis not present

## 2024-02-21 NOTE — Patient Instructions (Addendum)
 VISIT SUMMARY:  You visited today due to right ankle pain and possible infection following a fall two weeks ago. You have a history of diabetes and hypertension, both of which are being managed well. You also have a history of osteoarthritis and had a right knee replacement in 2022.  YOUR PLAN:  -INFECTION OF RIGHT ANKLE: You have an infection in your right ankle following a fall, which has caused pain, swelling, and a fever. X-rays showed no fractures. Continue taking Keflex 500 mg four times daily for the next three days. Keep your leg elevated to help reduce swelling. If your symptoms persist after finishing the antibiotics, we may need to consider extending or adding antibiotics.  -TYPE 2 DIABETES MELLITUS: Type 2 diabetes is a condition where your body does not use insulin  properly, leading to high blood sugar levels. Your diabetes is currently managed with Farxiga , which is effective but costly until your deductible is met.  -HYPERTENSION: Hypertension, or high blood pressure, is well-controlled with your current treatment.  -OSTEOARTHRITIS: Osteoarthritis is a condition that causes joint pain and stiffness. You have a history of this condition but no current issues.  -RIGHT KNEE REPLACEMENT: You had a right knee replacement in 2022, and there are no complications reported from this surgery.  INSTRUCTIONS: Continue taking Keflex 500 mg four times daily for the next three days. Keep your leg elevated to help reduce swelling. If your symptoms persist after finishing the antibiotics, we may need to re-evaluate and consider extending or adding antibiotics.

## 2024-02-21 NOTE — Assessment & Plan Note (Addendum)
 Story/exam most consistent with R lateral ankle cellulitis.  Currently on keflex treatment with improvement noted.  Finish abx course, let us  know if ongoing or not fully resolved, consider extending abx course vs additional antibiotic.  Pt agrees with plan.

## 2024-02-21 NOTE — Assessment & Plan Note (Signed)
 He is tolerating newest medication farxiga  as well as chronic metformin 

## 2024-02-21 NOTE — Progress Notes (Signed)
 Ph: [phone] Fax: [phone]   Patient ID: Roger Mcdaniel, male    DOB: 03/28/1951, 73 y.o.   MRN: [id number]  This visit was conducted in person.  BP 134/64   Pulse 69   Temp 98.1 F (36.7 C) (Oral)   Ht 5\' 4"  (1.626 m)   Wt 210 lb 6 oz (95.4 kg)   SpO2 98%   BMI 36.11 kg/m    Chief Complaint  Patient presents with   Ankle Pain    C/o R ankle pain. Se[facility] at [facility] on 02/17/24 after a fall while camping about 2 wks ago. Dx, infection- tx abx.     Subjective:   Discussed the use of AI scribe software for clinical note transcription with the patient, who gave verbal consent to proceed.  History of Present Illness   Roger Mcdaniel is a 73 year old male with diabetes and hypertension who presents with right ankle pain and possible infection following a fall.  Two weeks ago, he fell while camping, impacting his right ankle. Initially, there was no pain, but a week later, he developed pain, bruising, and redness on the heel. By last Friday, he had a fever of 100.62F, which resolved after sweating overnight. An evaluation at [facility] on May 23 showed no fractures but some arthritis. He was prescribed Keflex 500 mg four times daily for seven days, starting last Friday. The ankle pain persists, especially at night, but feels slightly better today after resting with legs elevated.  He was camping in [place]  for nine days, engaging in model airplane flying. He reports tingling in the leg prior to the fall. No tick bites were noted, although he found a tick on himself a few weeks ago.           Relevant past medical, surgical, family and social history reviewed and updated as indicated. Interim medical history since our last visit reviewed. Allergies and medications reviewed and updated. Outpatient Medications Prior to Visit  Medication Sig Dispense Refill   Ascorbic Acid (VITAMIN C ) 1000 MG tablet Take 1,000 mg by mouth daily. Every morning     aspirin  EC 81 MG  tablet Take 1 tablet (81 mg total) by mouth daily. Swallow whole. 30 tablet 0   atorvastatin  (LIPITOR ) 40 MG tablet Take 1 tablet (40 mg total) by mouth daily. 90 tablet 4   cephALEXin (KEFLEX) 500 MG capsule Take 1 capsule 4 times a day by oral route as directed for 7 days.     Cholecalciferol (VITAMIN D3) 50 MCG (2000 UT) TABS Take 1 tablet by mouth daily. Every morning     cyanocobalamin  (VITAMIN B12) 1000 MCG tablet Take 1 tablet (1,000 mcg total) by mouth daily.     dapagliflozin  propanediol (FARXIGA ) 5 MG TABS tablet Take 1 tablet (5 mg total) by mouth daily before breakfast. 30 tablet 3   glucose blood (ONETOUCH ULTRA TEST) test strip USE AS INSTRUCTED TO CHECK BLOOD SUGAR ONCE A DAY 50 strip 11   Lancets (ONETOUCH DELICA PLUS LANCET33G) MISC CHECK SUGAR ONCE DAILY DX E11.9 100 each 2   lisinopril  (ZESTRIL ) 20 MG tablet Take 1 tablet (20 mg total) by mouth daily. 30 tablet 6   Magnesium  250 MG TABS Take 250 mg by mouth daily.     metFORMIN  (GLUCOPHAGE ) 500 MG tablet Take 1 tablet (500 mg total) by mouth daily with breakfast. 90 tablet 4   zinc  gluconate 50 MG tablet Take 50 mg by mouth daily.  No facility-administered medications prior to visit.     Per HPI unless specifically indicated in ROS section below Review of Systems  Objective:  BP 134/64   Pulse 69   Temp 98.1 F (36.7 C) (Oral)   Ht 5\' 4"  (1.626 m)   Wt 210 lb 6 oz (95.4 kg)   SpO2 98%   BMI 36.11 kg/m   Wt Readings from Last 3 Encounters:  02/21/24 210 lb 6 oz (95.4 kg)  12/16/23 222 lb (100.7 kg)  12/09/23 219 lb 8 oz (99.6 kg)      Physical Exam   EXTREMITIES: Swelling on lateral right ankle, tender to palpation. Mild redness on right ankle, not warm to touch.       Physical Exam Vitals and nursing note reviewed.  Musculoskeletal:        General: Swelling and tenderness present.     Comments:  2+ DP bilaterally FROM at bilateral ankles Tiny scab removed from R ankle - looked under microscope -  scab not tick  Skin:    General: Skin is warm and dry.     Findings: Erythema and rash present.  Neurological:     Mental Status: He is alert.  Psychiatric:        Mood and Affect: Mood normal.        Behavior: Behavior normal.       Results   Procedure: Microscopic examination of scab from right ankle  RADIOLOGY Ankle X-ray: Negative for fracture, mild arthritis (02/17/2024) at orthopedics office      Results for orders placed or performed in visit on 12/09/23  POCT glycosylated hemoglobin (Hb A1C)   Collection Time: 12/09/23  7:30 AM  Result Value Ref Range   Hemoglobin A1C 6.5 (A) 4.0 - 5.6 %   HbA1c POC (<> result, manual entry)     HbA1c, POC (prediabetic range)     HbA1c, POC (controlled diabetic range)    Microalbumin / creatinine urine ratio   Collection Time: 12/09/23  8:12 AM  Result Value Ref Range   Microalb, Ur 17.2 (H) 0.0 - 1.9 mg/dL   Creatinine,U 04.5 mg/dL   Microalb Creat Ratio 193.2 (H) 0.0 - 30.0 mg/g   Lab Results  Component Value Date   NA 138 10/28/2023   CL 101 10/28/2023   K 4.7 10/28/2023   CO2 29 10/28/2023   BUN 22 10/28/2023   CREATININE 1.01 10/28/2023   GFR 74.90 10/28/2023   CALCIUM  9.0 10/28/2023   PHOS 2.9 04/27/2011   ALBUMIN 4.2 05/31/2023   GLUCOSE 104 (H) 10/28/2023   Assessment & Plan:      Infection of right ankle Infection post-fall with pain, swelling, and fever. X-rays negative for fracture. Possible tick bite considered. - Continue Keflex 500 mg QID for 3 days. - Elevate leg to reduce swelling. - Re-evaluate post-antibiotics if symptoms persist; consider extending or adding antibiotics.  Type 2 diabetes mellitus Managed with Farxiga , effective but costly until deductible met.  Hypertension Well-controlled with current treatment.  Osteoarthritis History of knee replacement in 2022, no current issues.  Right knee replacement Completed in 2022, no complications reported.      Problem List Items Addressed  This Visit     Type 2 diabetes mellitus with other specified complication (HCC)   He is tolerating newest medication farxiga  as well as chronic metformin        Cellulitis of right ankle - Primary   Story/exam most consistent with R lateral ankle cellulitis.  Currently on  keflex treatment with improvement noted.  Finish abx course, let us  know if ongoing or not fully resolved, consider extending abx course vs additional antibiotic.  Pt agrees with plan.         No orders of the defined types were placed in this encounter.   No orders of the defined types were placed in this encounter.   Patient Instructions  VISIT SUMMARY:  You visited today due to right ankle pain and possible infection following a fall two weeks ago. You have a history of diabetes and hypertension, both of which are being managed well. You also have a history of osteoarthritis and had a right knee replacement in 2022.  YOUR PLAN:  -INFECTION OF RIGHT ANKLE: You have an infection in your right ankle following a fall, which has caused pain, swelling, and a fever. X-rays showed no fractures. Continue taking Keflex 500 mg four times daily for the next three days. Keep your leg elevated to help reduce swelling. If your symptoms persist after finishing the antibiotics, we may need to consider extending or adding antibiotics.  -TYPE 2 DIABETES MELLITUS: Type 2 diabetes is a condition where your body does not use insulin  properly, leading to high blood sugar levels. Your diabetes is currently managed with Farxiga , which is effective but costly until your deductible is met.  -HYPERTENSION: Hypertension, or high blood pressure, is well-controlled with your current treatment.  -OSTEOARTHRITIS: Osteoarthritis is a condition that causes joint pain and stiffness. You have a history of this condition but no current issues.  -RIGHT KNEE REPLACEMENT: You had a right knee replacement in 2022, and there are no complications  reported from this surgery.  INSTRUCTIONS: Continue taking Keflex 500 mg four times daily for the next three days. Keep your leg elevated to help reduce swelling. If your symptoms persist after finishing the antibiotics, we may need to re-evaluate and consider extending or adding antibiotics.  Follow up plan: Return if symptoms worsen or fail to improve.  Claire Crick, MD

## 2024-02-23 ENCOUNTER — Encounter: Payer: Self-pay | Admitting: Family Medicine

## 2024-03-28 ENCOUNTER — Other Ambulatory Visit: Payer: Self-pay | Admitting: Family Medicine

## 2024-03-28 DIAGNOSIS — I10 Essential (primary) hypertension: Secondary | ICD-10-CM

## 2024-04-09 ENCOUNTER — Other Ambulatory Visit: Payer: Self-pay | Admitting: Family Medicine

## 2024-04-09 DIAGNOSIS — E1169 Type 2 diabetes mellitus with other specified complication: Secondary | ICD-10-CM

## 2024-04-12 ENCOUNTER — Other Ambulatory Visit: Payer: Self-pay | Admitting: Family Medicine

## 2024-04-12 DIAGNOSIS — E1169 Type 2 diabetes mellitus with other specified complication: Secondary | ICD-10-CM

## 2024-04-19 DIAGNOSIS — L570 Actinic keratosis: Secondary | ICD-10-CM | POA: Diagnosis not present

## 2024-04-19 DIAGNOSIS — C44619 Basal cell carcinoma of skin of left upper limb, including shoulder: Secondary | ICD-10-CM | POA: Diagnosis not present

## 2024-04-19 DIAGNOSIS — D2261 Melanocytic nevi of right upper limb, including shoulder: Secondary | ICD-10-CM | POA: Diagnosis not present

## 2024-04-19 DIAGNOSIS — D2262 Melanocytic nevi of left upper limb, including shoulder: Secondary | ICD-10-CM | POA: Diagnosis not present

## 2024-04-19 DIAGNOSIS — D235 Other benign neoplasm of skin of trunk: Secondary | ICD-10-CM | POA: Diagnosis not present

## 2024-04-19 DIAGNOSIS — D485 Neoplasm of uncertain behavior of skin: Secondary | ICD-10-CM | POA: Diagnosis not present

## 2024-04-19 DIAGNOSIS — D225 Melanocytic nevi of trunk: Secondary | ICD-10-CM | POA: Diagnosis not present

## 2024-04-19 DIAGNOSIS — L821 Other seborrheic keratosis: Secondary | ICD-10-CM | POA: Diagnosis not present

## 2024-04-19 DIAGNOSIS — D2271 Melanocytic nevi of right lower limb, including hip: Secondary | ICD-10-CM | POA: Diagnosis not present

## 2024-04-19 DIAGNOSIS — D2272 Melanocytic nevi of left lower limb, including hip: Secondary | ICD-10-CM | POA: Diagnosis not present

## 2024-04-27 DIAGNOSIS — M25512 Pain in left shoulder: Secondary | ICD-10-CM | POA: Diagnosis not present

## 2024-05-11 DIAGNOSIS — C44619 Basal cell carcinoma of skin of left upper limb, including shoulder: Secondary | ICD-10-CM | POA: Diagnosis not present

## 2024-05-18 DIAGNOSIS — M25512 Pain in left shoulder: Secondary | ICD-10-CM | POA: Diagnosis not present

## 2024-05-25 DIAGNOSIS — M25512 Pain in left shoulder: Secondary | ICD-10-CM | POA: Diagnosis not present

## 2024-05-30 ENCOUNTER — Telehealth: Payer: Self-pay

## 2024-05-30 NOTE — Telephone Encounter (Signed)
 Place pre op form in your  box for review. Pt has AWV 06/15/24. Do you want us  to set an additional visit or can you fill out at that visit?

## 2024-05-31 NOTE — Telephone Encounter (Signed)
 I didn't see preop form in my basket yesterday. Looks like arthroscopic RTC repair left planned.  Upcoming appt is [facility] f/u visit. Ok to discuss preop at that time.  He would need cardiac clearance from his cardiologist Dr Alveta.

## 2024-06-01 ENCOUNTER — Encounter: Payer: Self-pay | Admitting: Family Medicine

## 2024-06-01 NOTE — Telephone Encounter (Addendum)
 Called patient let him know. He will reach out to cardiology for appointment. Will us  know if any issues getting set up with new provider his has retired.

## 2024-06-04 ENCOUNTER — Telehealth: Payer: Self-pay | Admitting: *Deleted

## 2024-06-04 ENCOUNTER — Telehealth: Payer: Self-pay

## 2024-06-04 NOTE — Telephone Encounter (Signed)
 Pt has been scheduled tele preop appt 06/11/24, due to surgeon is waiting for preop clearance before scheduling procedure.   Med rec and consent are done.

## 2024-06-04 NOTE — Telephone Encounter (Signed)
   Name: Roger Mcdaniel  DOB: [birth year]  MRN: [id number]  Primary Cardiologist: Aleene Passe, MD (Inactive)   Preoperative team, please contact this patient and set up a phone call appointment for further preoperative risk assessment. Please obtain consent and complete medication review. Thank you for your help.  I confirm that guidance regarding antiplatelet and oral anticoagulation therapy has been completed and, if necessary, noted below.  Regarding ASA therapy, we recommend continuation of ASA throughout the perioperative period. However, if the surgeon feels that cessation of ASA is required in the perioperative period, it may be stopped 5-7 days prior to surgery with a plan to resume it as soon as felt to be feasible from a surgical standpoint in the post-operative period.    I also confirmed the patient resides in the state of [place] . As per [facility] Medical Board telemedicine laws, the patient must reside in the state in which the provider is licensed.   Josefa CHRISTELLA Beauvais, NP 06/04/2024, 2:09 PM [facility]

## 2024-06-04 NOTE — Telephone Encounter (Signed)
 Pt has been scheduled tele preop appt 06/11/24, due to surgeon is waiting for preop clearance before scheduling procedure.   Med rec and consent are done.     Patient Consent for Virtual Visit        Roger Mcdaniel has provided verbal consent on 06/04/2024 for a virtual visit (video or telephone).   CONSENT FOR VIRTUAL VISIT FOR:  Roger Mcdaniel  By participating in this virtual visit I agree to the following:  I hereby voluntarily request, consent and authorize [facility] and its employed or contracted physicians, physician assistants, nurse practitioners or other licensed health care professionals (the Practitioner), to provide me with telemedicine health care services (the "Services) as deemed necessary by the treating Practitioner. I acknowledge and consent to receive the Services by the Practitioner via telemedicine. I understand that the telemedicine visit will involve communicating with the Practitioner through live audiovisual communication technology and the disclosure of certain medical information by electronic transmission. I acknowledge that I have been given the opportunity to request an in-person assessment or other available alternative prior to the telemedicine visit and am voluntarily participating in the telemedicine visit.  I understand that I have the right to withhold or withdraw my consent to the use of telemedicine in the course of my care at any time, without affecting my right to future care or treatment, and that the Practitioner or I may terminate the telemedicine visit at any time. I understand that I have the right to inspect all information obtained and/or recorded in the course of the telemedicine visit and may receive copies of available information for a reasonable fee.  I understand that some of the potential risks of receiving the Services via telemedicine include:  Delay or interruption in medical evaluation due to technological equipment failure or  disruption; Information transmitted may not be sufficient (e.g. poor resolution of images) to allow for appropriate medical decision making by the Practitioner; and/or  In rare instances, security protocols could fail, causing a breach of personal health information.  Furthermore, I acknowledge that it is my responsibility to provide information about my medical history, conditions and care that is complete and accurate to the best of my ability. I acknowledge that Practitioner's advice, recommendations, and/or decision may be based on factors not within their control, such as incomplete or inaccurate data provided by me or distortions of diagnostic images or specimens that may result from electronic transmissions. I understand that the practice of medicine is not an exact science and that Practitioner makes no warranties or guarantees regarding treatment outcomes. I acknowledge that a copy of this consent can be made available to me via my patient portal [facility] MyChart), or I can request a printed copy by calling the office of  [facility].    I understand that my insurance will be billed for this visit.   I have read or had this consent read to me. I understand the contents of this consent, which adequately explains the benefits and risks of the Services being provided via telemedicine.  I have been provided ample opportunity to ask questions regarding this consent and the Services and have had my questions answered to my satisfaction. I give my informed consent for the services to be provided through the use of telemedicine in my medical care

## 2024-06-04 NOTE — Telephone Encounter (Signed)
   Pre-operative Risk Assessment    Patient Name: Roger Mcdaniel  DOB: 12/20/1950 MRN: [id number]   Date of last office visit: 12/16/23 ALEENE PASSE, MD Date of next office visit: NONE   Request for Surgical Clearance    Procedure:  LEFT SHOULDER ARTHROPLASTY, SUBACROMIAL  Date of Surgery:  Clearance TBD                                Surgeon:  DR REYES BILLING Surgeon's Group or Practice Name:  [facility] Phone number:  [phone] Fax number:  [phone]   ATTN: KATHERYN STONE   Type of Clearance Requested:   - Medical  - Pharmacy:  Hold Aspirin      Type of Anesthesia:  Not Indicated   Additional requests/questions:    SignedLucie DELENA Ku   06/04/2024, 1:59 PM

## 2024-06-07 NOTE — Telephone Encounter (Signed)
 Will it be ok to do pre op at next visit or do you want sperate appointment?

## 2024-06-08 ENCOUNTER — Other Ambulatory Visit: Payer: Self-pay | Admitting: Family Medicine

## 2024-06-08 ENCOUNTER — Other Ambulatory Visit

## 2024-06-08 DIAGNOSIS — E538 Deficiency of other specified B group vitamins: Secondary | ICD-10-CM

## 2024-06-08 DIAGNOSIS — Z125 Encounter for screening for malignant neoplasm of prostate: Secondary | ICD-10-CM

## 2024-06-08 DIAGNOSIS — E119 Type 2 diabetes mellitus without complications: Secondary | ICD-10-CM | POA: Diagnosis not present

## 2024-06-08 DIAGNOSIS — E559 Vitamin D deficiency, unspecified: Secondary | ICD-10-CM

## 2024-06-08 DIAGNOSIS — E1169 Type 2 diabetes mellitus with other specified complication: Secondary | ICD-10-CM

## 2024-06-08 DIAGNOSIS — E785 Hyperlipidemia, unspecified: Secondary | ICD-10-CM

## 2024-06-08 LAB — MICROALBUMIN / CREATININE URINE RATIO
Creatinine,U: 89.4 mg/dL
Microalb Creat Ratio: 179.3 mg/g — ABNORMAL HIGH (ref 0.0–30.0)
Microalb, Ur: 16 mg/dL — ABNORMAL HIGH (ref 0.0–1.9)

## 2024-06-08 LAB — LIPID PANEL
Cholesterol: 123 mg/dL (ref 0–200)
HDL: 43.1 mg/dL
LDL Cholesterol: 66 mg/dL (ref 0–99)
NonHDL: 79.93
Total CHOL/HDL Ratio: 3
Triglycerides: 70 mg/dL (ref 0.0–149.0)
VLDL: 14 mg/dL (ref 0.0–40.0)

## 2024-06-08 LAB — COMPREHENSIVE METABOLIC PANEL WITH GFR
ALT: 15 U/L (ref 0–53)
AST: 19 U/L (ref 0–37)
Albumin: 4.2 g/dL (ref 3.5–5.2)
Alkaline Phosphatase: 78 U/L (ref 39–117)
BUN: 20 mg/dL (ref 6–23)
CO2: 29 meq/L (ref 19–32)
Calcium: 9.3 mg/dL (ref 8.4–10.5)
Chloride: 101 meq/L (ref 96–112)
Creatinine, Ser: 1.05 mg/dL (ref 0.40–1.50)
GFR: 71.18 mL/min
Glucose, Bld: 101 mg/dL — ABNORMAL HIGH (ref 70–99)
Potassium: 4.7 meq/L (ref 3.5–5.1)
Sodium: 138 meq/L (ref 135–145)
Total Bilirubin: 1.1 mg/dL (ref 0.2–1.2)
Total Protein: 6.9 g/dL (ref 6.0–8.3)

## 2024-06-08 LAB — HEMOGLOBIN A1C: Hgb A1c MFr Bld: 7.2 % — ABNORMAL HIGH (ref 4.6–6.5)

## 2024-06-08 LAB — VITAMIN B12: Vitamin B-12: >1500 pg/mL — ABNORMAL HIGH (ref 211–911)

## 2024-06-08 LAB — HM DIABETES EYE EXAM

## 2024-06-08 LAB — PSA, MEDICARE: PSA: 1.54 ng/mL (ref 0.10–4.00)

## 2024-06-08 LAB — VITAMIN D 25 HYDROXY (VIT D DEFICIENCY, FRACTURES): VITD: 52.21 ng/mL (ref 30.00–100.00)

## 2024-06-11 ENCOUNTER — Ambulatory Visit: Attending: Internal Medicine

## 2024-06-11 ENCOUNTER — Ambulatory Visit: Payer: Self-pay | Admitting: Family Medicine

## 2024-06-11 DIAGNOSIS — Z01810 Encounter for preprocedural cardiovascular examination: Secondary | ICD-10-CM | POA: Diagnosis not present

## 2024-06-11 NOTE — Progress Notes (Signed)
 Virtual Visit via Telephone Note   Because of Roger Mcdaniel co-morbid illnesses, he is at least at moderate risk for complications without adequate follow up.  This format is felt to be most appropriate for this patient at this time.  Due to technical limitations with video connection (technology), today's appointment will be conducted as an audio only telehealth visit, and Roger Mcdaniel verbally agreed to proceed in this manner.   All issues noted in this document were discussed and addressed.  No physical exam could be performed with this format.  Evaluation Performed:  Preoperative cardiovascular risk assessment _____________   Date:  06/11/2024   Patient ID:  Roger Mcdaniel, DOB [birth year], MRN [id number] Patient Location:  Home Provider location:   Office  Primary Care Provider:  Rilla Baller, MD Primary Cardiologist:  Roger Passe, MD (Inactive)  Chief Complaint / Patient Profile  73 y.o. y/o male with a h/o coronary artery calcification on CT scan, hypertension, sleep apnea, hyperlipidemia, diabetes mellitus, GERD, aortic atherosclerosis, hypertension, hyperlipidemia, OSA who is pending left shoulder arthroplasty, subacromial with Dr. Reyes Mcdaniel and presents today for telephonic preoperative cardiovascular risk assessment. History of Present Illness  Roger Mcdaniel is a 73 y.o. male who presents via audio/video conferencing for a telehealth visit today.  Pt was last seen in cardiology clinic on 12/16/2023 by Dr. Passe.  At that time Roger Mcdaniel was doing well .  The patient is now pending procedure as outlined above. Since his last visit, he has remained stable from a cardiac standpoint.  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 Today he denies chest pain, shortness of breath, lower extremity edema, fatigue,  palpitations, melena, hematuria, hemoptysis, diaphoresis, weakness, presyncope, syncope, orthopnea, and PND.  Past Medical History    Past Medical History:  Diagnosis Date   Back pain    Bruises easily    Chest pain    CMC arthritis, thumb, degenerative    Collapse of left lung    DDD (degenerative disc disease), cervical 2015   s/o ACDF Roger Mcdaniel)   DM type 2 (diabetes mellitus, type 2) (HCC)    Dry skin    Edema, lower extremity    GERD (gastroesophageal reflux disease) 09/2003   History of ETT 05/23/2000   Nml (Dr. Passe)   History of kidney stones    HNP (herniated nucleus pulposus), cervical 12/20/2013   Hyperlipidemia 09/2003   Hypertension 06/2004   Joint pain    Obesity    OSA (obstructive sleep apnea)    was on CPAP, not used  in 25-30 years    Pre-diabetes    SOB (shortness of breath)    Swallowing difficulty    Past Surgical History:  Procedure Laterality Date   ANTERIOR CERVICAL DECOMP/DISCECTOMY FUSION N/A 01/14/2014   Procedure: ANTERIOR CERVICAL DECOMPRESSION/DISCECTOMY FUSION CERVICAL FOUR-FIVE,CERVICAL FIVE-SIX ;  Surgeon: Roger LELON Peaches, MD   BRONCHIAL WASHINGS Left 11/05/2021   Procedure: BRONCHIAL WASHINGS;  Surgeon: Roger Adine CROME, DO;  Location: [facility];  Service: Pulmonary;  Laterality: Left;   CATARACT EXTRACTION Right 02/28/2019   Per patient   CATARACT EXTRACTION Left 03/12/2019   Per patient   CYSTOSCOPY/URETEROSCOPY/HOLMIUM LASER/STENT PLACEMENT Right 05/28/2020   Procedure: CYSTOSCOPY RIGHT URETEROSCOPY/HOLMIUM LASER/STENT PLACEMENT STONE BASKET RETRACTION;  Surgeon: Roger Sherwood BIRCH III,  MD;  Location: [facility];  Service: Urology;  Laterality: Right;   EXTRACORPOREAL SHOCK WAVE LITHOTRIPSY Right 03/17/2020   Procedure: RIGHT EXTRACORPOREAL SHOCK WAVE LITHOTRIPSY (ESWL);  Surgeon: Roger Lonni Righter, MD;  Location: [facility];  Service: Urology;  Laterality: Right;   FLEXIBLE BRONCHOSCOPY  10/2021    Flexible video fiberoptic bronchoscopy and biopsies for partially collapsed L lung (Icard)   KNEE ARTHROPLASTY Right 11/27/2020   Procedure: COMPUTER ASSISTED TOTAL KNEE ARTHROPLASTY;  Surgeon: Roger Rogue, MD;  Location: [facility];  Service: Orthopedics;  Laterality: Right;   ORIF ANKLE FRACTURE  1980   right   RETINAL LASER PROCEDURE Right 03/14/2019   Per patient   SEPTOPLASTY  2005   for sleep apnea //cpap   SHOULDER SURGERY Right 06/2017   Dr Roger Mcdaniel at [facility] orth[facility]   VIDEO BRONCHOSCOPY Bilateral 11/05/2021   Procedure: VIDEO BRONCHOSCOPY WITHOUT FLUORO;  Surgeon: Roger Adine CROME, DO;  Location: [facility];  Service: Pulmonary;  Laterality: Bilateral;  Cytology not needed   WRIST ARTHROSCOPY WITH CARPOMETACARPEL (CMC) ARTHROPLASTY Right 11/2018   R CMC and R MCP arthroplasty (Roger Mcdaniel)   Allergies Allergies  Allergen Reactions   Codeine Sulfate Nausea Only   Pravachol  [Pravastatin  Sodium] Other (See Comments)    Muscle aches   Home Medications    Prior to Admission medications   Medication Sig Start Date End Date Taking? Authorizing Provider  Ascorbic Acid (VITAMIN C ) 1000 MG tablet Take 1,000 mg by mouth daily. Every morning    [provider]  aspirin  EC 81 MG tablet Take 1 tablet (81 mg total) by mouth daily. Swallow whole. 09/17/21   Roger Charlie RAMAN, MD  atorvastatin  (LIPITOR ) 40 MG tablet Take 1 tablet (40 mg total) by mouth daily. 06/06/23   Roger Baller, MD  cephALEXin (KEFLEX) 500 MG capsule Take 1 capsule 4 times a day by oral route as directed for 7 days. 02/17/24   [provider]  Cholecalciferol (VITAMIN D3) 50 MCG (2000 UT) TABS Take 1 tablet by mouth daily. Every morning    [provider]  cyanocobalamin  (VITAMIN B12) 1000 MCG tablet Take 1 tablet (1,000 mcg total) by mouth daily. 06/06/23   Roger Baller, MD  dapagliflozin  propanediol (FARXIGA ) 5 MG TABS tablet TAKE 1 TABLET BY MOUTH DAILY BEFORE BREAKFAST. 04/12/24   Roger Baller, MD  glucose blood [facility] ULTRA TEST) test strip USE AS INSTRUCTED TO CHECK BLOOD SUGAR ONCE A DAY 01/25/24   Roger Baller, MD  Lancets [facility] DELICA PLUS [place]) MISC Use as instructed to check sugars once a day 04/09/24   Roger Baller, MD  lisinopril  (ZESTRIL ) 20 MG tablet TAKE 1 TABLET BY MOUTH EVERY DAY 03/28/24   Roger Baller, MD  Magnesium  250 MG TABS Take 250 mg by mouth daily.    [provider]  metFORMIN  (GLUCOPHAGE ) 500 MG tablet Take 1 tablet (500 mg total) by mouth daily with breakfast. 06/06/23   Roger Baller, MD  zinc  gluconate 50 MG tablet Take 50 mg by mouth daily.    [provider]    Physical Exam  Vital Signs:  Roger Mcdaniel does not have vital signs available for review today. Given telephonic nature of communication, physical exam is limited. AAOx3. NAD. Normal affect.  Speech and respirations are unlabored. Accessory Clinical Findings  None Assessment & Plan    1.  Preoperative Cardiovascular Risk Assessment: Roger Mcdaniel perioperative risk of a major cardiac event is 0.4% according to the Revised  Cardiac Risk Index (RCRI).  Therefore, he is at low risk for perioperative complications.   His functional capacity is excellent at 8.97 METs according to the [facility] Activity Status Index (DASI). Recommendations: According to ACC/AHA guidelines, no further cardiovascular testing needed.  The patient may proceed to surgery at acceptable risk.   Antiplatelet and/or Anticoagulation Recommendations: Regarding ASA therapy, we recommend continuation of ASA throughout the perioperative period.  However, if the surgeon feels that cessation of ASA is required in the perioperative period, it may be stopped 5-7 days prior to surgery with a plan to resume it as soon as felt to be feasible from a surgical standpoint in the post-operative period.   The patient was advised that if he develops new symptoms prior to surgery to contact our office to  arrange for a follow-up visit, and he verbalized understanding.  A copy of this note will be routed to requesting surgeon.  Time:   Today, I have spent 9 minutes with the patient with telehealth technology discussing medical history, symptoms, and management plan.    Aletheia Tangredi D Korey Arroyo, NP  06/11/2024, 4:42 PM

## 2024-06-12 ENCOUNTER — Ambulatory Visit (INDEPENDENT_AMBULATORY_CARE_PROVIDER_SITE_OTHER)

## 2024-06-12 VITALS — BP 125/78 | Ht 64.25 in | Wt 211.0 lb

## 2024-06-12 DIAGNOSIS — Z0000 Encounter for general adult medical examination without abnormal findings: Secondary | ICD-10-CM | POA: Diagnosis not present

## 2024-06-12 NOTE — Progress Notes (Signed)
 Because this visit was a virtual/telehealth visit,  certain criteria was not obtained, such a blood pressure, CBG if applicable, and timed get up and go. Any medications not marked as taking were not mentioned during the medication reconciliation part of the visit. Any vitals not documented were not able to be obtained due to this being a telehealth visit or patient was unable to self-report a recent blood pressure reading due to a lack of equipment at home via telehealth. Vitals that have been documented are verbally provided by the patient.  This visit was performed by a medical professional under my direct supervision. I was immediately available for consultation/collaboration. I have reviewed and agree with the Annual Wellness Visit documentation.  Subjective:   Roger Mcdaniel is a 73 y.o. who presents for a Medicare Wellness preventive visit.  As a reminder, Annual Wellness Visits don't include a physical exam, and some assessments may be limited, especially if this visit is performed virtually. We may recommend an in-person follow-up visit with your provider if needed.  Visit Complete: Virtual I connected with  Roger Mcdaniel on 72/[birth year]/25 by a audio enabled telemedicine application and verified that I am speaking with the correct person using two identifiers.  Patient Location: Home  Provider Location: Home Office  I discussed the limitations of evaluation and management by telemedicine. The patient expressed understanding and agreed to proceed.  Vital Signs: Because this visit was a virtual/telehealth visit, some criteria may be missing or patient reported. Any vitals not documented were not able to be obtained and vitals that have been documented are patient reported.  VideoDeclined- This patient declined Librarian, academic. Therefore the visit was completed with audio only.  Persons Participating in Visit: Patient.  AWV Questionnaire: Yes: Patient Medicare  AWV questionnaire was completed by the patient on 06/11/2024; I have confirmed that all information answered by patient is correct and no changes since this date.  Cardiac Risk Factors include: obesity (BMI >30kg/m2);advanced age (>11men, >57 women);diabetes mellitus;hypertension;male gender     Objective:    Today's Vitals   06/11/24 1851 06/12/24 1548  BP:  125/78  Weight:  211 lb (95.7 kg)  Height:  5' 4.25 (1.632 m)  PainSc: 6     Body mass index is 35.94 kg/m.     06/12/2024    3:53 PM 11/05/2021    6:40 AM 09/17/2021    7:23 PM 11/27/2020    4:14 PM 11/18/2020   10:10 AM 05/28/2020    8:51 AM 04/04/2020    9:08 AM  Advanced Directives  Does Patient Have a Medical Advance Directive? Yes Yes No Yes Yes Yes Yes  Type of Estate agent of [place];Living will Healthcare Power of [place];Living will  Healthcare Power of [place];Living will Healthcare Power of [place];Living will  Healthcare Power of [place];Living will  Does patient want to make changes to medical advance directive? No - Patient declined   No - Patient declined  No - Patient declined   Copy of Healthcare Power of Attorney in Chart? Yes - validated most recent copy scanned in chart (See row information)   No - copy requested   Yes - validated most recent copy scanned in chart (See row information)    Current Medications (verified) Outpatient Encounter Medications as of 06/12/2024  Medication Sig   Ascorbic Acid (VITAMIN C ) 1000 MG tablet Take 1,000 mg by mouth daily. Every morning   aspirin  EC 81 MG tablet Take 1 tablet (81 mg  total) by mouth daily. Swallow whole.   atorvastatin  (LIPITOR ) 40 MG tablet Take 1 tablet (40 mg total) by mouth daily.   Cholecalciferol (VITAMIN D3) 50 MCG (2000 UT) TABS Take 1 tablet by mouth daily. Every morning   cyanocobalamin  (VITAMIN B12) 1000 MCG tablet Take 1 tablet (1,000 mcg total) by mouth daily.   dapagliflozin  propanediol (FARXIGA ) 5 MG TABS tablet TAKE 1  TABLET BY MOUTH DAILY BEFORE BREAKFAST.   glucose blood (ONETOUCH ULTRA TEST) test strip USE AS INSTRUCTED TO CHECK BLOOD SUGAR ONCE A DAY   Lancets (ONETOUCH DELICA PLUS LANCET33G) MISC Use as instructed to check sugars once a day   lisinopril  (ZESTRIL ) 20 MG tablet TAKE 1 TABLET BY MOUTH EVERY DAY   Magnesium  250 MG TABS Take 250 mg by mouth daily.   metFORMIN  (GLUCOPHAGE ) 500 MG tablet Take 1 tablet (500 mg total) by mouth daily with breakfast.   zinc  gluconate 50 MG tablet Take 50 mg by mouth daily.   cephALEXin (KEFLEX) 500 MG capsule Take 1 capsule 4 times a day by oral route as directed for 7 days.   No facility-administered encounter medications on file as of 06/12/2024.    Allergies (verified) Codeine sulfate and Pravachol  [pravastatin  sodium]   History: Past Medical History:  Diagnosis Date   Back pain    Bruises easily    Chest pain    CMC arthritis, thumb, degenerative    Collapse of left lung    DDD (degenerative disc disease), cervical 2015   s/o ACDF ([place])   DM type 2 (diabetes mellitus, type 2) (HCC)    Dry skin    Edema, lower extremity    GERD (gastroesophageal reflux disease) 09/2003   History of ETT 05/23/2000   Nml (Dr. Alveta)   History of kidney stones    HNP (herniated nucleus pulposus), cervical 12/20/2013   Hyperlipidemia 09/2003   Hypertension 06/2004   Joint pain    Obesity    OSA (obstructive sleep apnea)    was on CPAP, not used  in 25-30 years    Pre-diabetes    SOB (shortness of breath)    Swallowing difficulty    Past Surgical History:  Procedure Laterality Date   ANTERIOR CERVICAL DECOMP/DISCECTOMY FUSION N/A 01/14/2014   Procedure: ANTERIOR CERVICAL DECOMPRESSION/DISCECTOMY FUSION CERVICAL FOUR-FIVE,CERVICAL FIVE-SIX ;  Surgeon: Lamar LELON Peaches, MD   BRONCHIAL WASHINGS Left 11/05/2021   Procedure: BRONCHIAL WASHINGS;  Surgeon: Brenna Adine CROME, DO;  Location: [facility];  Service: Pulmonary;  Laterality: Left;   CATARACT  EXTRACTION Right 02/28/2019   Per patient   CATARACT EXTRACTION Left 03/12/2019   Per patient   CYSTOSCOPY/URETEROSCOPY/HOLMIUM LASER/STENT PLACEMENT Right 05/28/2020   Procedure: CYSTOSCOPY RIGHT URETEROSCOPY/HOLMIUM LASER/STENT PLACEMENT STONE BASKET RETRACTION;  Surgeon: Carolee Sherwood JONETTA DOUGLAS, MD;  Location: [facility];  Service: Urology;  Laterality: Right;   EXTRACORPOREAL SHOCK WAVE LITHOTRIPSY Right 03/17/2020   Procedure: RIGHT EXTRACORPOREAL SHOCK WAVE LITHOTRIPSY (ESWL);  Surgeon: Devere Lonni Righter, MD;  Location: [facility];  Service: Urology;  Laterality: Right;   FLEXIBLE BRONCHOSCOPY  10/2021   Flexible video fiberoptic bronchoscopy and biopsies for partially collapsed L lung (Icard)   KNEE ARTHROPLASTY Right 11/27/2020   Procedure: COMPUTER ASSISTED TOTAL KNEE ARTHROPLASTY;  Surgeon: Fidel Rogue, MD;  Location: [facility];  Service: Orthopedics;  Laterality: Right;   ORIF ANKLE FRACTURE  1980   right   RETINAL LASER PROCEDURE Right 03/14/2019   Per patient   SEPTOPLASTY  2005  for sleep apnea //cpap   SHOULDER SURGERY Right 06/2017   Dr Melita [facility]   VIDEO BRONCHOSCOPY Bilateral 11/05/2021   Procedure: VIDEO BRONCHOSCOPY WITHOUT FLUORO;  Surgeon: Brenna Adine CROME, DO;  Location: [facility];  Service: Pulmonary;  Laterality: Bilateral;  Cytology not needed   WRIST ARTHROSCOPY WITH CARPOMETACARPEL (CMC) ARTHROPLASTY Right 11/2018   R CMC and R MCP arthroplasty ([facility]ig)   Family History  Problem Relation Age of Onset   CVA Mother    Cancer Mother    Heart disease Father        MI x 2   Hypertension Brother    Obesity Brother    Heart disease Paternal Grandfather         MI   Cancer Other    Social History   Socioeconomic History   Marital status: Widowed    Spouse name: Not on file   Number of children: Not on file   Years of education: Not on file   Highest education level: 12th grade  Occupational History    Occupation: Astronomer    Comment: Aeronautical engineer   Occupation: semi retired  Tobacco Use   Smoking status: Never   Smokeless tobacco: Never  Advertising account planner   Vaping status: Never Used  Substance and Sexual Activity   Alcohol  use: Not Currently    Alcohol /week: 1.0 standard drink of alcohol     Types: 1 Standard drinks or equivalent per week   Drug use: No   Sexual activity: Yes  Other Topics Concern   Not on file  Social History Narrative   Married. 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   Activity: no regular exercise   Social Drivers of Corporate investment banker Strain: Low Risk  (06/11/2024)   Overall Financial Resource Strain (CARDIA)    Difficulty of Paying Living Expenses: Not hard at all  Food Insecurity: No Food Insecurity (06/12/2024)   Hunger Vital Sign    Worried About Running Out of Food in the Last Year: Never true    Ran Out of Food in the Last Year: Never true  Transportation Needs: No Transportation Needs (06/11/2024)   PRAPARE - Administrator, Civil Service (Medical): No    Lack of Transportation (Non-Medical): No  Physical Activity: Insufficiently Active (06/11/2024)   Exercise Vital Sign    Days of Exercise per Week: 4 days    Minutes of Exercise per Session: 20 min  Stress: No Stress Concern Present (06/11/2024)   [employer] of Occupational Health - Occupational Stress Questionnaire    Feeling of Stress: Only a little  Social Connections: Moderately Integrated (06/11/2024)   Social Connection and Isolation Panel    Frequency of Communication with Friends and Family: More than three times a week    Frequency of Social Gatherings with Friends and Family: Twice a week    Attends Religious Services: 1 to 4 times per year    Active Member of [employer] or Organizations: Yes    Attends Banker Meetings: More than 4 times per year     Marital Status: Widowed    Tobacco Counseling Counseling given: Not Answered    Clinical Intake:  Pre-visit preparation completed: Yes  Pain : 0-10 Pain Score: 6  Pain Type: Chronic pain Pain Location: Shoulder Pain Orientation: Left Pain Onset: Today Pain Frequency: Constant  BMI - recorded: 35.94 Nutritional Status: BMI > 30  Obese Nutritional Risks: None Diabetes: Yes CBG done?: No Did pt. bring in CBG monitor from home?: No  Lab Results  Component Value Date   HGBA1C 7.2 (H) 06/08/2024   HGBA1C 6.5 (A) 12/09/2023   HGBA1C 6.1 05/31/2023     How often do you need to have someone help you when you read instructions, pamphlets, or other written materials from your doctor or pharmacy?: 1 - Never  Interpreter Needed?: No  Information entered by :: Marvetta Vohs,CMA   Activities of Daily Living     06/11/2024    6:51 PM  In your present state of health, do you have any difficulty performing the following activities:  Hearing? 0  Vision? 0  Difficulty concentrating or making decisions? 0  Walking or climbing stairs? 0  Dressing or bathing? 0  Doing errands, shopping? 0  Preparing Food and eating ? N  Using the Toilet? N  In the past six months, have you accidently leaked urine? Y  Do you have problems with loss of bowel control? N  Managing your Medications? N  Managing your Finances? N  Housekeeping or managing your Housekeeping? N    Patient Care Team: Rilla Baller, MD as PCP - General (Family Medicine) Nahser, Aleene PARAS, MD (Inactive) as PCP - Cardiology (Cardiology)  I have updated your Care Teams any recent Medical Services you may have received from other providers in the past year.     Assessment:   This is a routine wellness examination for [place].  Hearing/Vision screen Hearing Screening - Comments:: Patient has no difficulties  Vision Screening - Comments:: Patient has had eye surgery    Goals Addressed             This  Visit's Progress    Patient Stated       To stay healthy        Depression Screen     06/12/2024    3:54 PM 06/06/2023    7:52 AM 11/29/2022    8:03 AM 05/24/2022    9:20 AM 01/05/2022    6:47 AM 05/22/2021   10:18 AM 04/04/2020    9:13 AM  PHQ 2/9 Scores  PHQ - 2 Score 2 0 0 0 2 0 0  PHQ- 9 Score 2  1  12   0    Fall Risk     06/11/2024    6:51 PM 06/06/2023    7:52 AM 11/29/2022    8:03 AM 05/24/2022    9:19 AM 05/22/2021   10:18 AM  Fall Risk   Falls in the past year? 1 0 0 1 0  Number falls in past yr: 0 0  0 0  Injury with Fall? 1 0  1 0  Comment    Partial collasped L lung   Risk for fall due to : History of fall(s) No Fall Risks     Follow up Falls prevention discussed;Falls evaluation completed;Education provided Falls evaluation completed       MEDICARE RISK AT HOME:  Medicare Risk at Home Any stairs in or around the home?: (Patient-Rptd) No If so, are there any without handrails?: No Home free of loose throw rugs in walkways, pet beds, electrical cords, etc?: (Patient-Rptd) Yes Adequate lighting in your home to reduce risk of falls?: (Patient-Rptd) Yes Life alert?: (Patient-Rptd) No Use of a cane, walker or w/c?: (Patient-Rptd) No Grab bars in the bathroom?: (Patient-Rptd) Yes Shower chair or bench  in shower?: (Patient-Rptd) No Elevated toilet seat or a handicapped toilet?: (Patient-Rptd) No  TIMED UP AND GO:  Was the test performed?  No  Cognitive Function: 6CIT completed    04/04/2020    9:17 AM  MMSE - Mini Mental State Exam  Orientation to time 5  Orientation to Place 5  Registration 3  Attention/ Calculation 5  Recall 3  Language- repeat 1        06/12/2024    3:51 PM  6CIT Screen  What Year? 0 points  What month? 0 points  What time? 0 points  Count back from 20 0 points  Months in reverse 0 points  Repeat phrase 0 points  Total Score 0 points    Immunizations Immunization History  Administered Date(s) Administered   Fluad Quad(high Dose  65+) 07/26/2019, 06/25/2022, 05/27/2023   INFLUENZA, HIGH DOSE SEASONAL PF 08/01/2017, 06/22/2020, 06/22/2021, 06/25/2022, 05/27/2023   Influenza-Unspecified 07/14/2016   PFIZER Comirnaty(Gray Top)Covid-19 Tri-Sucrose Vaccine 06/25/2022, 05/27/2023   PFIZER(Purple Top)SARS-COV-2 Vaccination 10/22/2019, 11/12/2019, 06/22/2020, 01/13/2021   Pfizer Covid-19 Vaccine Bivalent Booster 8yrs & up 06/22/2021, 02/08/2022   Pneumococcal Conjugate-13 12/12/2017   Pneumococcal Polysaccharide-23 10/12/2019, 01/13/2021   Td 01/30/1999   Tdap 05/05/2011   Zoster Recombinant(Shingrix) 02/08/2022, 04/12/2022    Screening Tests Health Maintenance  Topic Date Due   Colonoscopy  Never done   DTaP/Tdap/Td (3 - Td or Tdap) 05/04/2021   Influenza Vaccine  04/27/2024   OPHTHALMOLOGY EXAM  05/05/2024   COVID-19 Vaccine (9 - 2025-26 season) 05/28/2024   COLON CANCER SCREENING ANNUAL FOBT  06/09/2024   HEMOGLOBIN A1C  12/06/2024   FOOT EXAM  12/08/2024   Diabetic kidney evaluation - eGFR measurement  06/08/2025   Diabetic kidney evaluation - Urine ACR  06/08/2025   Medicare Annual Wellness (AWV)  06/12/2025   Pneumococcal Vaccine: 50+ Years  Completed   Hepatitis C Screening  Completed   Zoster Vaccines- Shingrix  Completed   HPV VACCINES  Aged Out   Meningococcal B Vaccine  Aged Out    Health Maintenance Items Addressed:patient declined   Additional Screening:  Vision Screening: Recommended annual ophthalmology exams for early detection of glaucoma and other disorders of the eye. Is the patient up to date with their annual eye exam?  No  Who is the provider or what is the name of the office in which the patient attends annual eye exams?   Dental Screening: Recommended annual dental exams for proper oral hygiene  Community Resource Referral / Chronic Care Management: CRR required this visit?  No   CCM required this visit?  No   Plan:    I have personally reviewed and noted the following in  the patient's chart:   Medical and social history Use of alcohol , tobacco or illicit drugs  Current medications and supplements including opioid prescriptions. Patient is not currently taking opioid prescriptions. Functional ability and status Nutritional status Physical activity Advanced directives List of other physicians Hospitalizations, surgeries, and ER visits in previous 12 months Vitals Screenings to include cognitive, depression, and falls Referrals and appointments  In addition, I have reviewed and discussed with patient certain preventive protocols, quality metrics, and best practice recommendations. A written personalized care plan for preventive services as well as general preventive health recommendations were provided to patient.   Lyle MARLA Right, [place]   06/12/2024   After Visit Summary: (MyChart) Due to this being a telephonic visit, the after visit summary with patients personalized plan was offered to patient via  MyChart   Notes: Nothing significant to report at this time.

## 2024-06-12 NOTE — Patient Instructions (Signed)
 Roger Mcdaniel,  Thank you for taking the time for your Medicare Wellness Visit. I appreciate your continued commitment to your health goals. Please review the care plan we discussed, and feel free to reach out if I can assist you further.  Medicare recommends these wellness visits once per year to help you and your care team stay ahead of potential health issues. These visits are designed to focus on prevention, allowing your provider to concentrate on managing your acute and chronic conditions during your regular appointments.  Please note that Annual Wellness Visits do not include a physical exam. Some assessments may be limited, especially if the visit was conducted virtually. If needed, we may recommend a separate in-person follow-up with your provider.  Ongoing Care Seeing your primary care provider every 3 to 6 months helps us  monitor your health and provide consistent, personalized care.   Referrals If a referral was made during today's visit and you haven't received any updates within two weeks, please contact the referred provider directly to check on the status.  Recommended Screenings:  Health Maintenance  Topic Date Due   Colon Cancer Screening  Never done   DTaP/Tdap/Td vaccine (3 - Td or Tdap) 05/04/2021   Flu Shot  04/27/2024   Eye exam for diabetics  05/05/2024   COVID-19 Vaccine (9 - 2025-26 season) 05/28/2024   Stool Blood Test  06/09/2024   Hemoglobin A1C  12/06/2024   Complete foot exam   12/08/2024   Yearly kidney function blood test for diabetes  06/08/2025   Yearly kidney health urinalysis for diabetes  06/08/2025   Medicare Annual Wellness Visit  06/12/2025   Pneumococcal Vaccine for age over 37  Completed   Hepatitis C Screening  Completed   Zoster (Shingles) Vaccine  Completed   HPV Vaccine  Aged Out   Meningitis B Vaccine  Aged Out       06/12/2024    3:53 PM  Advanced Directives  Does Patient Have a Medical Advance Directive? Yes  Type of Sports coach of [place];Living will  Does patient want to make changes to medical advance directive? No - Patient declined  Copy of Healthcare Power of Attorney in Chart? Yes - validated most recent copy scanned in chart (See row information)   Advance Care Planning is important because it: Ensures you receive medical care that aligns with your values, goals, and preferences. Provides guidance to your family and loved ones, reducing the emotional burden of decision-making during critical moments.  Vision: Annual vision screenings are recommended for early detection of glaucoma, cataracts, and diabetic retinopathy. These exams can also reveal signs of chronic conditions such as diabetes and high blood pressure.  Dental: Annual dental screenings help detect early signs of oral cancer, gum disease, and other conditions linked to overall health, including heart disease and diabetes.  Please see the attached documents for additional preventive care recommendations.

## 2024-06-15 ENCOUNTER — Ambulatory Visit: Admitting: Family Medicine

## 2024-06-15 ENCOUNTER — Ambulatory Visit (INDEPENDENT_AMBULATORY_CARE_PROVIDER_SITE_OTHER)
Admission: RE | Admit: 2024-06-15 | Discharge: 2024-06-15 | Disposition: A | Discharge status: Home / Self Care | Source: Ambulatory Visit | Attending: Family Medicine | Admitting: Family Medicine

## 2024-06-15 ENCOUNTER — Encounter: Payer: Self-pay | Admitting: Family Medicine

## 2024-06-15 VITALS — BP 168/72 | HR 67 | Temp 97.8°F | Ht 63.25 in | Wt 215.2 lb

## 2024-06-15 DIAGNOSIS — E538 Deficiency of other specified B group vitamins: Secondary | ICD-10-CM

## 2024-06-15 DIAGNOSIS — Z01818 Encounter for other preprocedural examination: Secondary | ICD-10-CM | POA: Diagnosis not present

## 2024-06-15 DIAGNOSIS — Z7189 Other specified counseling: Secondary | ICD-10-CM

## 2024-06-15 DIAGNOSIS — Z23 Encounter for immunization: Secondary | ICD-10-CM

## 2024-06-15 DIAGNOSIS — E785 Hyperlipidemia, unspecified: Secondary | ICD-10-CM

## 2024-06-15 DIAGNOSIS — Z981 Arthrodesis status: Secondary | ICD-10-CM | POA: Diagnosis not present

## 2024-06-15 DIAGNOSIS — Z1211 Encounter for screening for malignant neoplasm of colon: Secondary | ICD-10-CM

## 2024-06-15 DIAGNOSIS — E1169 Type 2 diabetes mellitus with other specified complication: Secondary | ICD-10-CM | POA: Diagnosis not present

## 2024-06-15 DIAGNOSIS — I10 Essential (primary) hypertension: Secondary | ICD-10-CM | POA: Diagnosis not present

## 2024-06-15 DIAGNOSIS — R9389 Abnormal findings on diagnostic imaging of other specified body structures: Secondary | ICD-10-CM | POA: Diagnosis not present

## 2024-06-15 DIAGNOSIS — E559 Vitamin D deficiency, unspecified: Secondary | ICD-10-CM

## 2024-06-15 DIAGNOSIS — I2510 Atherosclerotic heart disease of native coronary artery without angina pectoris: Secondary | ICD-10-CM

## 2024-06-15 MED ORDER — VITAMIN B-12 1000 MCG PO TABS: 1000.0000 ug | ORAL_TABLET | ORAL | Status: AC

## 2024-06-15 MED ORDER — METFORMIN HCL 500 MG PO TABS
500.0000 mg | ORAL_TABLET | Freq: Every day | ORAL | 3 refills | Status: AC
Start: 2024-06-15 — End: ?

## 2024-06-15 MED ORDER — LISINOPRIL 20 MG PO TABS: 20.0000 mg | ORAL_TABLET | Freq: Two times a day (BID) | ORAL | 3 refills | Status: DC

## 2024-06-15 MED ORDER — LISINOPRIL 20 MG PO TABS: 20.0000 mg | ORAL_TABLET | Freq: Every day | ORAL | 3 refills | Status: DC

## 2024-06-15 MED ORDER — ATORVASTATIN CALCIUM 40 MG PO TABS: 40.0000 mg | ORAL_TABLET | Freq: Every day | ORAL | 3 refills | Status: AC

## 2024-06-15 MED ORDER — DAPAGLIFLOZIN PROPANEDIOL 10 MG PO TABS: 10.0000 mg | ORAL_TABLET | Freq: Every day | ORAL | 11 refills | Status: AC

## 2024-06-15 NOTE — Assessment & Plan Note (Signed)
 Continue vit D 2000 units daily

## 2024-06-15 NOTE — Patient Instructions (Addendum)
 Flu shot today Chest xray today  I will sign you up for Cologuard.  Hold b12 for 2 weeks then start once weekly dosing. Increase farxiga  to 10mg  daily - new dose at pharmacy  BP was too high today - increase lisinopril  to 20mg  twice daily. Work on low salt/sodium diet.  Hold ibuprofen, take tylenol  for pain  Return in 2 weeks for blood pressure check and repeat labs.

## 2024-06-15 NOTE — Assessment & Plan Note (Signed)
 Levels now too high on vit B12 1000mcg daily - will hold x 2 wks then drop dose to once weekly .

## 2024-06-15 NOTE — Assessment & Plan Note (Signed)
 Already received cardiac clearance.  Update CXR today.  BP was very high today - see below for instructions.  RTC 2 wks HTN f/u visit, pending clearance on better BP control.

## 2024-06-15 NOTE — Assessment & Plan Note (Signed)
Chronic, stable on atorvastatin - continue. ?The ASCVD Risk score (Arnett DK, et al., 2019) failed to calculate for the following reasons: ?  The valid total cholesterol range is 130 to 320 mg/dL  ?

## 2024-06-15 NOTE — Progress Notes (Signed)
 Ph: [phone] Fax: [phone]   Patient ID: Roger Mcdaniel, male    DOB: Feb 16, 1951, 73 y.o.   MRN: [id number]  This visit was conducted in person.  BP (!) 168/72 (BP Location: Right Arm, Cuff Size: Large)   Pulse 67   Temp 97.8 F (36.6 C) (Oral)   Ht 5' 3.25 (1.607 m)   Wt 215 lb 4 oz (97.6 kg)   SpO2 99%   BMI 37.83 kg/m   BP Readings from Last 3 Encounters:  06/15/24 (!) 168/72  06/12/24 125/78  02/21/24 134/64   CC: AMW f/u visit, preop eval Subjective:   HPI: Roger Mcdaniel is a 73 y.o. male presenting on 06/15/2024 for Annual Exam   [name]  has a past medical history of Back pain, Bruises easily, Chest pain, CMC arthritis, thumb, degenerative, Collapse of left lung, DDD (degenerative disc disease), cervical (2015), DM type 2 (diabetes mellitus, type 2) (HCC), Dry skin, Edema, lower extremity, GERD (gastroesophageal reflux disease) (09/2003), History of ETT (05/23/2000), History of kidney stones, HNP (herniated nucleus pulposus), cervical (12/20/2013), Hyperlipidemia (09/2003), Hypertension (06/2004), Joint pain, Obesity, OSA (obstructive sleep apnea), Pre-diabetes, SOB (shortness of breath), and Swallowing difficulty.  Planned upcoming L shoulder arthroplasty by Dr Duwayne orthopedist for full thickness supra infraspinatus and subscapularis tears, biceps tear, and degenerative labral and mod to severe AC arthorosis.   He has already received cardiac clearance earlier this week by Katlyn West NP.   Patient has tolerated anesthesia well in the past.  Latest surgical intervention was flexible bronchoscopy 2023, prior R knee replacement 11/2020.  Denies trouble with post-op nausea/vomiting, or trouble awakening after surgery.   Denies chest pain, dyspnea, palpitations, leg swelling, HA, dizziness.  No fevers/chills, coughing, abd pain, diarrhea, or UTI symptoms.  _____________________________________________________________________ Saw health advisor earlier  this week for medicare wellness visit. Note reviewed.   No results found.  Flowsheet Row Office Visit from 06/15/2024 in [facility] [place]  PHQ-2 Total Score 0       06/15/2024    9:16 AM 06/11/2024    6:51 PM 06/06/2023    7:52 AM 11/29/2022    8:03 AM 05/24/2022    9:19 AM  Fall Risk   Falls in the past year? 1 1 0 0 1  Number falls in past yr: 0 0 0  0  Injury with Fall? 1 1 0  1  Comment     Partial collasped L lung  Risk for fall due to : Other (Comment) History of fall(s) No Fall Risks    Follow up Falls evaluation completed Falls prevention discussed;Falls evaluation completed;Education provided Falls evaluation completed    01/2024 - fall while camping.   Recent diagnosis BCC L upper arm s/p treatment by dermatology (Dr [facility])  BP at home today was 130s/70s but then started rising to 160/80s. He's already taken his lisinopril  20mg  daily and farxiga  5mg  daily. Traditionally BP well controlled at home.  He notes he's been taking more ibuprofen recently due to shoulder pain.   2 wk h/o cold symptoms - he was exposed to ill grandson. Notes PNDrainage, productive cough, but no fevers/chills, ST, headache. Feels more congested in head. Has not tried any medications for this yet.   Preventative: Colon cancer screening - yearly stool kit. Has declined colonoscopy.  Prostate cancer screening - discussed.  Continue yearly PSA.  Lung cancer screening - not eligible  Flu shot - yearly COVID vaccine - [employer]  09/2019, 10/2019, booster x2 05/2020, 12/2020, bivalent 05/2021, 01/2022, 04/2023 Td 2000, Tdap 2012  Prevnar-13 11/2017, pnuemovax-23 09/2019, 12/2020  Shingrix - 01/2022, 03/2022 Advanced directives received and scanned 05/2017. HCPOA are son Harden then DIL Heather. Grants discretion to [facility] to make decisions for life-prolonging measures.  Seat belt use discussed  Sunscreen use discussed. Sees derm q6 months ([facility]) Non smoker  Alcohol  - <1 drink  rarely Dentist q6 mo  Eye exam yearly  Bowel - no constipation Bladder - no incontinence but some urgency.    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 Interim medical history since our last visit reviewed. Allergies and medications reviewed and updated. Outpatient Medications Prior to Visit  Medication Sig Dispense Refill   Ascorbic Acid (VITAMIN C ) 1000 MG tablet Take 1,000 mg by mouth daily. Every morning     aspirin  EC 81 MG tablet Take 1 tablet (81 mg total) by mouth daily. Swallow whole. 30 tablet 0   Cholecalciferol (VITAMIN D3) 50 MCG (2000 UT) TABS Take 1 tablet by mouth daily. Every morning     glucose blood (ONETOUCH ULTRA TEST) test strip USE AS INSTRUCTED TO CHECK BLOOD SUGAR ONCE A DAY 50 strip 11   Lancets (ONETOUCH DELICA PLUS LANCET33G) MISC Use as instructed to check sugars once a day 100 each 3   Magnesium  250 MG TABS Take 250 mg by mouth daily.     zinc  gluconate 50 MG tablet Take 50 mg by mouth daily.     atorvastatin  (LIPITOR ) 40 MG tablet Take 1 tablet (40 mg total) by mouth daily. 90 tablet 4   cyanocobalamin  (VITAMIN B12) 1000 MCG tablet Take 1 tablet (1,000 mcg total) by mouth daily.     dapagliflozin  propanediol (FARXIGA ) 5 MG TABS tablet TAKE 1 TABLET BY MOUTH DAILY BEFORE BREAKFAST. 30 tablet 2   lisinopril  (ZESTRIL ) 20 MG tablet TAKE 1 TABLET BY MOUTH EVERY DAY 90 tablet 0   metFORMIN  (GLUCOPHAGE ) 500 MG tablet Take 1 tablet (500 mg total) by mouth daily with breakfast. 90 tablet 4   cephALEXin (KEFLEX) 500 MG capsule Take 1 capsule 4 times a day by oral route as  directed for 7 days.     No facility-administered medications prior to visit.     Per HPI unless specifically indicated in ROS section below Review of Systems  Objective:  BP (!) 168/72 (BP Location: Right Arm, Cuff Size: Large)   Pulse 67   Temp 97.8 F (36.6 C) (Oral)   Ht 5' 3.25 (1.607 m)   Wt 215 lb 4 oz (97.6 kg)   SpO2 99%   BMI 37.83 kg/m   Wt Readings from Last 3 Encounters:  06/15/24 215 lb 4 oz (97.6 kg)  06/12/24 211 lb (95.7 kg)  02/21/24 210 lb 6 oz (95.4 kg)      Physical Exam Vitals and nursing note reviewed.  Constitutional:      General: He is not in acute distress.    Appearance: Normal appearance. He is well-developed. He is not ill-appearing.  HENT:     Head: Normocephalic and atraumatic.     Right Ear:  Hearing, tympanic membrane, ear canal and external ear normal.     Left Ear: Hearing, tympanic membrane, ear canal and external ear normal.     Mouth/Throat:     Mouth: Mucous membranes are moist.     Pharynx: Oropharynx is clear. No oropharyngeal exudate or posterior oropharyngeal erythema.  Eyes:     General: No scleral icterus.    Extraocular Movements: Extraocular movements intact.     Conjunctiva/sclera: Conjunctivae normal.     Pupils: Pupils are equal, round, and reactive to light.  Neck:     Thyroid : No thyroid  mass or thyromegaly.     Vascular: No carotid bruit.  Cardiovascular:     Rate and Rhythm: Normal rate and regular rhythm.     Pulses: Normal pulses.          Radial pulses are 2+ on the right side and 2+ on the left side.     Heart sounds: Normal heart sounds. No murmur heard. Pulmonary:     Effort: Pulmonary effort is normal. No respiratory distress.     Breath sounds: Normal breath sounds. No wheezing, rhonchi or rales.  Abdominal:     General: Bowel sounds are normal. There is no distension.     Palpations: Abdomen is soft. There is no mass.     Tenderness: There is no abdominal tenderness. There is no guarding or rebound.      Hernia: No hernia is present.  Musculoskeletal:        General: Normal range of motion.     Cervical back: Normal range of motion and neck supple.     Right lower leg: No edema.     Left lower leg: No edema.  Lymphadenopathy:     Cervical: No cervical adenopathy.  Skin:    General: Skin is warm and dry.     Findings: No rash.  Neurological:     General: No focal deficit present.     Mental Status: He is alert and oriented to person, place, and time.  Psychiatric:        Mood and Affect: Mood normal.        Behavior: Behavior normal.        Thought Content: Thought content normal.        Judgment: Judgment normal.       Results for orders placed or performed in visit on 06/08/24  PSA, Medicare   Collection Time: 06/08/24  7:21 AM  Result Value Ref Range   PSA 1.54 0.10 - 4.00 ng/ml  VITAMIN D  25 Hydroxy (Vit-D Deficiency, Fractures)   Collection Time: 06/08/24  7:21 AM  Result Value Ref Range   VITD 52.21 30.00 - 100.00 ng/mL  Vitamin B12   Collection Time: 06/08/24  7:21 AM  Result Value Ref Range   Vitamin B-12 >1500 (H) 211 - 911 pg/mL  Comprehensive metabolic panel with GFR   Collection Time: 06/08/24  7:21 AM  Result Value Ref Range   Sodium 138 135 - 145 mEq/L   Potassium 4.7 3.5 - 5.1 mEq/L   Chloride 101 96 - 112 mEq/L   CO2 29 19 - 32 mEq/L   Glucose, Bld 101 (H) 70 - 99 mg/dL   BUN 20 6 - 23 mg/dL   Creatinine, Ser 8.94 0.40 - 1.50 mg/dL   Total Bilirubin 1.1 0.2 - 1.2 mg/dL   Alkaline Phosphatase 78 39 - 117 U/L   AST 19 0 - 37 U/L   ALT 15 0 - 53 U/L  Total Protein 6.9 6.0 - 8.3 g/dL   Albumin 4.2 3.5 - 5.2 g/dL   GFR 28.81 >39.99 mL/min   Calcium  9.3 8.4 - 10.5 mg/dL  Lipid panel   Collection Time: 06/08/24  7:21 AM  Result Value Ref Range   Cholesterol 123 0 - 200 mg/dL   Triglycerides 29.9 0.0 - 149.0 mg/dL   HDL 56.89 >60.99 mg/dL   VLDL 85.9 0.0 - 59.9 mg/dL   LDL Cholesterol 66 0 - 99 mg/dL   Total CHOL/HDL Ratio 3    NonHDL 79.93    Microalbumin / creatinine urine ratio   Collection Time: 06/08/24  7:21 AM  Result Value Ref Range   Microalb, Ur 16.0 (H) 0.0 - 1.9 mg/dL   Creatinine,U 10.5 mg/dL   Microalb Creat Ratio 179.3 (H) 0.0 - 30.0 mg/g  Hemoglobin A1c   Collection Time: 06/08/24  7:21 AM  Result Value Ref Range   Hgb A1c MFr Bld 7.2 (H) 4.6 - 6.5 %   Lab Results  Component Value Date   WBC 7.4 11/05/2021   HGB 14.6 11/05/2021   HCT 45.1 11/05/2021   MCV 91.9 11/05/2021   PLT 256 11/05/2021   Assessment & Plan:   Problem List Items Addressed This Visit     Advanced care planning/counseling discussion - Primary (Chronic)   Previously discussed       Hyperlipidemia associated with type 2 diabetes mellitus (HCC)   Chronic, stable on atorvastatin  - continue. The ASCVD Risk score (Arnett DK, et al., 2019) failed to calculate for the following reasons:   The valid total cholesterol range is 130 to 320 mg/dL       Relevant Medications   atorvastatin  (LIPITOR ) 40 MG tablet   dapagliflozin  propanediol (FARXIGA ) 10 MG TABS tablet   metFORMIN  (GLUCOPHAGE ) 500 MG tablet   lisinopril  (ZESTRIL ) 20 MG tablet   Essential hypertension, benign   Chronic, deteriorated. Rec stop ibuprofen.  If persistently elevated BP readings at home, will increase lisinopril  to 20mg  BID.  RTC 2 wks HTN f/u visit in setting of preop eval.       Relevant Medications   atorvastatin  (LIPITOR ) 40 MG tablet   lisinopril  (ZESTRIL ) 20 MG tablet   Severe obesity (BMI 35.0-39.9) with comorbidity (HCC)   Reviewed healthy diet and lifestyle changes to effect sustainable weight loss.   Obesity complicated by comorbidities of HTN, DM, HLD, CAD, OA      Relevant Medications   dapagliflozin  propanediol (FARXIGA ) 10 MG TABS tablet   metFORMIN  (GLUCOPHAGE ) 500 MG tablet   Type 2 diabetes mellitus with other specified complication (HCC)   Chronic, deteriorated A1c to 7.2% - will increase farxiga  to 10mg  daily. Continue metformin   500mg  daily.       Relevant Medications   atorvastatin  (LIPITOR ) 40 MG tablet   dapagliflozin  propanediol (FARXIGA ) 10 MG TABS tablet   metFORMIN  (GLUCOPHAGE ) 500 MG tablet   lisinopril  (ZESTRIL ) 20 MG tablet   Pre-op evaluation   Already received cardiac clearance.  Update CXR today.  BP was very high today - see below for instructions.  RTC 2 wks HTN f/u visit, pending clearance on better BP control.       Relevant Orders   DG Chest 2 View   CBC with Differential   CAD (coronary artery disease)   Continues aspirin , statin, followed by cardiology.       Relevant Medications   atorvastatin  (LIPITOR ) 40 MG tablet   lisinopril  (ZESTRIL ) 20 MG tablet   Vitamin  D deficiency   Continue vit D 2000 units daily.       Low serum vitamin B12   Levels now too high on vit B12 1000mcg daily - will hold x 2 wks then drop dose to once weekly .       Other Visit Diagnoses       Encounter for immunization       Relevant Orders   Flu vaccine HIGH DOSE PF(Fluzone Trivalent) (Completed)     Special screening for malignant neoplasms, colon       Relevant Orders   Cologuard        Meds ordered this encounter  Medications   atorvastatin  (LIPITOR ) 40 MG tablet    Sig: Take 1 tablet (40 mg total) by mouth daily.    Dispense:  90 tablet    Refill:  3   cyanocobalamin  (VITAMIN B12) 1000 MCG tablet    Sig: Take 1 tablet (1,000 mcg total) by mouth once a week.   dapagliflozin  propanediol (FARXIGA ) 10 MG TABS tablet    Sig: Take 1 tablet (10 mg total) by mouth daily before breakfast.    Dispense:  30 tablet    Refill:  11    Note new dose   DISCONTD: lisinopril  (ZESTRIL ) 20 MG tablet    Sig: Take 1 tablet (20 mg total) by mouth daily.    Dispense:  90 tablet    Refill:  3   metFORMIN  (GLUCOPHAGE ) 500 MG tablet    Sig: Take 1 tablet (500 mg total) by mouth daily with breakfast.    Dispense:  90 tablet    Refill:  3   lisinopril  (ZESTRIL ) 20 MG tablet    Sig: Take 1 tablet (20 mg  total) by mouth in the morning and at bedtime.    Dispense:  180 tablet    Refill:  3    Note new dose    Orders Placed This Encounter  Procedures   DG Chest 2 View    Standing Status:   Future    Number of Occurrences:   1    Expiration Date:   06/15/2025    Reason for Exam (SYMPTOM  OR DIAGNOSIS REQUIRED):   preop clearance    Preferred imaging location?:   [facility]   Flu vaccine HIGH DOSE PF(Fluzone Trivalent)   Cologuard   CBC with Differential    Patient Instructions  Flu shot today Chest xray today  I will sign you up for Cologuard.  Hold b12 for 2 weeks then start once weekly dosing. Increase farxiga  to 10mg  daily - new dose at pharmacy  BP was too high today - increase lisinopril  to 20mg  twice daily. Work on low salt/sodium diet.  Hold ibuprofen, take tylenol  for pain  Return in 2 weeks for blood pressure check and repeat labs.   Follow up plan: Return in about 2 weeks (around 06/29/2024) for follow up visit.  Anton Blas, MD

## 2024-06-15 NOTE — Assessment & Plan Note (Signed)
 Chronic, deteriorated. Rec stop ibuprofen.  If persistently elevated BP readings at home, will increase lisinopril  to 20mg  BID.  RTC 2 wks HTN f/u visit in setting of preop eval.

## 2024-06-15 NOTE — Assessment & Plan Note (Signed)
 Chronic, deteriorated A1c to 7.2% - will increase farxiga  to 10mg  daily. Continue metformin  500mg  daily.

## 2024-06-15 NOTE — Assessment & Plan Note (Signed)
 Reviewed healthy diet and lifestyle changes to effect sustainable weight loss.   Obesity complicated by comorbidities of HTN, DM, HLD, CAD, OA

## 2024-06-15 NOTE — Assessment & Plan Note (Signed)
 Previously discussed.

## 2024-06-15 NOTE — Addendum Note (Signed)
 Addended by: HOPE VEVA PARAS on: 06/15/2024 09:46 AM   Modules accepted: Orders

## 2024-06-15 NOTE — Assessment & Plan Note (Signed)
 Continues aspirin , statin, followed by cardiology.

## 2024-06-18 ENCOUNTER — Encounter: Payer: Self-pay | Admitting: Family Medicine

## 2024-06-18 ENCOUNTER — Ambulatory Visit: Payer: Self-pay | Admitting: Family Medicine

## 2024-06-18 ENCOUNTER — Other Ambulatory Visit: Payer: Self-pay | Admitting: Family Medicine

## 2024-06-18 DIAGNOSIS — I10 Essential (primary) hypertension: Secondary | ICD-10-CM

## 2024-06-19 ENCOUNTER — Encounter: Payer: Self-pay | Admitting: Specialist

## 2024-06-23 NOTE — Telephone Encounter (Signed)
 Addressed OV.

## 2024-06-25 DIAGNOSIS — Z1211 Encounter for screening for malignant neoplasm of colon: Secondary | ICD-10-CM | POA: Diagnosis not present

## 2024-06-29 ENCOUNTER — Other Ambulatory Visit (INDEPENDENT_AMBULATORY_CARE_PROVIDER_SITE_OTHER)

## 2024-06-29 DIAGNOSIS — I10 Essential (primary) hypertension: Secondary | ICD-10-CM | POA: Diagnosis not present

## 2024-06-29 LAB — COLOGUARD: COLOGUARD: NEGATIVE

## 2024-06-29 LAB — BASIC METABOLIC PANEL WITH GFR
BUN: 19 mg/dL (ref 6–23)
CO2: 28 meq/L (ref 19–32)
Calcium: 9.3 mg/dL (ref 8.4–10.5)
Chloride: 101 meq/L (ref 96–112)
Creatinine, Ser: 1.04 mg/dL (ref 0.40–1.50)
GFR: 71.97 mL/min (ref >60.00)
Glucose, Bld: 99 mg/dL (ref 70–99)
Potassium: 4.4 meq/L (ref 3.5–5.1)
Sodium: 137 meq/L (ref 135–145)

## 2024-07-02 ENCOUNTER — Ambulatory Visit: Payer: Self-pay | Admitting: Family Medicine

## 2024-07-03 ENCOUNTER — Other Ambulatory Visit: Payer: Self-pay

## 2024-07-03 ENCOUNTER — Encounter: Payer: Self-pay | Admitting: Family Medicine

## 2024-07-03 ENCOUNTER — Ambulatory Visit: Admitting: Family Medicine

## 2024-07-03 VITALS — BP 136/60 | HR 71 | Temp 97.8°F | Ht 63.25 in | Wt 204.5 lb

## 2024-07-03 DIAGNOSIS — I10 Essential (primary) hypertension: Secondary | ICD-10-CM

## 2024-07-03 DIAGNOSIS — R052 Subacute cough: Secondary | ICD-10-CM | POA: Diagnosis not present

## 2024-07-03 DIAGNOSIS — J9811 Atelectasis: Secondary | ICD-10-CM

## 2024-07-03 DIAGNOSIS — R059 Cough, unspecified: Secondary | ICD-10-CM | POA: Insufficient documentation

## 2024-07-03 MED ORDER — BOOSTRIX 5-2.5-18.5 LF-MCG/0.5 IM SUSY
PREFILLED_SYRINGE | INTRAMUSCULAR | 0 refills | Status: AC
  Filled 2024-07-03: qty 0.5, 1d supply, fill #0

## 2024-07-03 MED ORDER — LISINOPRIL 30 MG PO TABS: 30.0000 mg | ORAL_TABLET | Freq: Every day | ORAL | 3 refills | Status: DC

## 2024-07-03 MED ORDER — LISINOPRIL 30 MG PO TABS
30.0000 mg | ORAL_TABLET | Freq: Every day | ORAL | 9 refills | Status: AC
Start: 2024-07-03 — End: ?

## 2024-07-03 NOTE — Assessment & Plan Note (Signed)
 Chronic, improved however now trending too low some evenings - pt overall asymptomatic. Will drop lisinopril  from 20mg  bid to 30mg  once daily. New dose sent to pharmacy. With better BP control, anticipate adequately low risk to proceed with planned surgical intervention.  Will forward today's note attn Dr Duwayne.

## 2024-07-03 NOTE — Assessment & Plan Note (Signed)
 Chronic issue, s/p pulmonology eval 2023.  Chronic L hemidiaphragm elevation after traumatic fall.  Pt remains asxs from respiratory standpoint.

## 2024-07-03 NOTE — Patient Instructions (Addendum)
 Update tetanus shot today through [facility] pharmacist.  Blood pressure is overall improved, but sometimes too low especially in evenings.  Drop lisinopril  dose to 30mg  once daily - new dose sent to pharmacy.  Ok to proceed with planned surgery. I will forward today's note attn Dr Duwayne.  Return in 3-4 months for follow up visit

## 2024-07-03 NOTE — Progress Notes (Signed)
 Ph: [phone] Fax: [phone]   Patient ID: Roger Mcdaniel, male    DOB: 09-26-1951, 73 y.o.   MRN: [id number]  This visit was conducted in person.  BP 136/60   Pulse 71   Temp 97.8 F (36.6 C) (Oral)   Ht 5' 3.25 (1.607 m)   Wt 204 lb 8 oz (92.8 kg)   SpO2 98%   BMI 35.94 kg/m    CC: HTN f/u visit  Subjective:   HPI: Roger Mcdaniel is a 73 y.o. male presenting on 07/03/2024 for Medical Management of Chronic Issues (Pt here for 2 wk HTN f/u)   See prior note for details.  Last visit we stopped ibuprofen and increased lisinopril  to 20mg  bid.   BP is better controlled as evidenced by recent home readings and in office reading today.  However notes some low blood pressure readings down to 90/50s (last night), HR stable at 60-80s.  Over the past 24 hours, BP ranging 90-139/60-70s.      No HA, vision changes, CP/tightness, SOB, leg swelling. No low BP symptoms of dizziness, lightheadedness.   Notes persistent nagging cough with tickle in back of throat ongoing for 6-7 weeks. At times coughing up phlegm. Worse in evenings.      Relevant past medical, surgical, family and social history reviewed and updated as indicated. Interim medical history since our last visit reviewed. Allergies and medications reviewed and updated. Outpatient Medications Prior to Visit  Medication Sig Dispense Refill   Ascorbic Acid (VITAMIN C ) 1000 MG tablet Take 1,000 mg by mouth daily. Every morning     aspirin  EC 81 MG tablet Take 1 tablet (81 mg total) by mouth daily. Swallow whole. 30 tablet 0   atorvastatin  (LIPITOR ) 40 MG tablet Take 1 tablet (40 mg total) by mouth daily. 90 tablet 3   Cholecalciferol (VITAMIN D3) 50 MCG (2000 UT) TABS Take 1 tablet by mouth daily. Every morning     cyanocobalamin  (VITAMIN B12) 1000 MCG tablet Take 1 tablet (1,000 mcg total) by mouth once a week.     dapagliflozin  propanediol (FARXIGA ) 10 MG TABS tablet Take 1 tablet (10 mg total) by mouth daily before  breakfast. 30 tablet 11   glucose blood (ONETOUCH ULTRA TEST) test strip USE AS INSTRUCTED TO CHECK BLOOD SUGAR ONCE A DAY 50 strip 11   Lancets (ONETOUCH DELICA PLUS LANCET33G) MISC Use as instructed to check sugars once a day 100 each 3   Magnesium  250 MG TABS Take 250 mg by mouth daily.     metFORMIN  (GLUCOPHAGE ) 500 MG tablet Take 1 tablet (500 mg total) by mouth daily with breakfast. 90 tablet 3   zinc  gluconate 50 MG tablet Take 50 mg by mouth daily.     lisinopril  (ZESTRIL ) 20 MG tablet Take 1 tablet (20 mg total) by mouth in the morning and at bedtime. 180 tablet 3   No facility-administered medications prior to visit.     Per HPI unless specifically indicated in ROS section below Review of Systems  Objective:  BP 136/60   Pulse 71   Temp 97.8 F (36.6 C) (Oral)   Ht 5' 3.25 (1.607 m)   Wt 204 lb 8 oz (92.8 kg)   SpO2 98%   BMI 35.94 kg/m   Wt Readings from Last 3 Encounters:  07/03/24 204 lb 8 oz (92.8 kg)  06/15/24 215 lb 4 oz (97.6 kg)  06/12/24 211 lb (95.7 kg)      Physical Exam Vitals  and nursing note reviewed.  Constitutional:      Appearance: Normal appearance. He is not ill-appearing.  HENT:     Mouth/Throat:     Mouth: Mucous membranes are moist.     Pharynx: Oropharynx is clear. No oropharyngeal exudate or posterior oropharyngeal erythema.  Eyes:     Extraocular Movements: Extraocular movements intact.     Conjunctiva/sclera: Conjunctivae normal.     Pupils: Pupils are equal, round, and reactive to light.  Cardiovascular:     Rate and Rhythm: Normal rate and regular rhythm.     Pulses: Normal pulses.     Heart sounds: Normal heart sounds. No murmur heard. Pulmonary:     Effort: Pulmonary effort is normal. No respiratory distress.     Breath sounds: No wheezing, rhonchi or rales.     Comments: Diminished to LLL - chronic  Musculoskeletal:     Right lower leg: No edema.     Left lower leg: No edema.  Neurological:     Mental Status: He is alert.        Results for orders placed or performed in visit on 06/29/24  Basic metabolic panel with GFR   Collection Time: 06/29/24  7:38 AM  Result Value Ref Range   Sodium 137 135 - 145 mEq/L   Potassium 4.4 3.5 - 5.1 mEq/L   Chloride 101 96 - 112 mEq/L   CO2 28 19 - 32 mEq/L   Glucose, Bld 99 70 - 99 mg/dL   BUN 19 6 - 23 mg/dL   Creatinine, Ser 8.95 0.40 - 1.50 mg/dL   GFR 28.02 >39.99 mL/min   Calcium  9.3 8.4 - 10.5 mg/dL   Lab Results  Component Value Date   HGBA1C 7.2 (H) 06/08/2024   DG Chest 2 View EXAM: 2 VIEW(S) XRAY OF THE CHEST 06/15/2024 09:50:18 AM  COMPARISON: None available.  CLINICAL HISTORY: Preop clearance.  FINDINGS:  LUNGS AND PLEURA: No focal pulmonary opacity. No pulmonary edema. No pleural effusion. No pneumothorax.  HEART AND MEDIASTINUM: No acute abnormality of the cardiac and mediastinal silhouettes. Elevated left hemidiaphragm.  BONES AND SOFT TISSUES: No acute osseous abnormality. Cervical spine fusion hardware. Bilateral shoulder degenerative changes. Thoracic spondylosis.  IMPRESSION: 1. No acute findings. 2. Elevated left hemidiaphragm. 3. Cervical spine fusion hardware. 4. Bilateral shoulder degenerative changes. 5. Thoracic spondylosis.  Electronically signed by: Waddell Calk MD 06/15/2024 11:27 AM EDT RP Workstation: [id number]  Assessment & Plan:   Problem List Items Addressed This Visit     Essential hypertension, benign - Primary   Chronic, improved however now trending too low some evenings - pt overall asymptomatic. Will drop lisinopril  from 20mg  bid to 30mg  once daily. New dose sent to pharmacy. With better BP control, anticipate adequately low risk to proceed with planned surgical intervention.  Will forward today's note attn Dr Duwayne.       Relevant Medications   lisinopril  (ZESTRIL ) 30 MG tablet   Collapse of left lung   Chronic issue, s/p pulmonology eval 2023.  Chronic L hemidiaphragm elevation after  traumatic fall.  Pt remains asxs from respiratory standpoint.       Cough   He notes persistent nagging cough, worse in evenings, present for the past 6-7 wks, may have started after URI.  No signs of infectious cause for cough, lungs clear today, afebrile.  Suspect possible ACEI related - however as not too bothersome will not change antihypertensive regimen at this time.  Meds ordered this encounter  Medications   DISCONTD: lisinopril  (ZESTRIL ) 30 MG tablet    Sig: Take 1 tablet (30 mg total) by mouth daily.    Dispense:  90 tablet    Refill:  3    Note new 30mg  dose   lisinopril  (ZESTRIL ) 30 MG tablet    Sig: Take 1 tablet (30 mg total) by mouth daily.    Dispense:  30 tablet    Refill:  9    Note new 30mg  dose    No orders of the defined types were placed in this encounter.   Patient Instructions  Update tetanus shot today through [facility] pharmacist.  Blood pressure is overall improved, but sometimes too low especially in evenings.  Drop lisinopril  dose to 30mg  once daily - new dose sent to pharmacy.  Ok to proceed with planned surgery. I will forward today's note attn Dr Duwayne.  Return in 3-4 months for follow up visit   Follow up plan: Return in about 4 months (around 11/03/2024) for follow up visit.  Anton Blas, MD

## 2024-07-03 NOTE — Assessment & Plan Note (Addendum)
 He notes persistent nagging cough, worse in evenings, present for the past 6-7 wks, may have started after URI.  No signs of infectious cause for cough, lungs clear today, afebrile.  Suspect possible ACEI related - however as not too bothersome will not change antihypertensive regimen at this time.

## 2024-07-04 ENCOUNTER — Other Ambulatory Visit: Payer: Self-pay | Admitting: Family Medicine

## 2024-07-04 DIAGNOSIS — E1169 Type 2 diabetes mellitus with other specified complication: Secondary | ICD-10-CM

## 2024-07-23 DIAGNOSIS — M94212 Chondromalacia, left shoulder: Secondary | ICD-10-CM | POA: Diagnosis not present

## 2024-07-23 DIAGNOSIS — M7552 Bursitis of left shoulder: Secondary | ICD-10-CM | POA: Diagnosis not present

## 2024-07-23 DIAGNOSIS — X58XXXA Exposure to other specified factors, initial encounter: Secondary | ICD-10-CM | POA: Diagnosis not present

## 2024-07-23 DIAGNOSIS — M7542 Impingement syndrome of left shoulder: Secondary | ICD-10-CM | POA: Diagnosis not present

## 2024-07-23 DIAGNOSIS — S43492A Other sprain of left shoulder joint, initial encounter: Secondary | ICD-10-CM | POA: Diagnosis not present

## 2024-07-23 DIAGNOSIS — G8918 Other acute postprocedural pain: Secondary | ICD-10-CM | POA: Diagnosis not present

## 2024-07-23 DIAGNOSIS — S46012A Strain of muscle(s) and tendon(s) of the rotator cuff of left shoulder, initial encounter: Secondary | ICD-10-CM | POA: Diagnosis not present

## 2024-07-23 DIAGNOSIS — M65912 Unspecified synovitis and tenosynovitis, left shoulder: Secondary | ICD-10-CM | POA: Diagnosis not present

## 2024-07-23 DIAGNOSIS — Y999 Unspecified external cause status: Secondary | ICD-10-CM | POA: Diagnosis not present

## 2024-07-23 DIAGNOSIS — M7582 Other shoulder lesions, left shoulder: Secondary | ICD-10-CM | POA: Diagnosis not present

## 2024-07-23 DIAGNOSIS — M19012 Primary osteoarthritis, left shoulder: Secondary | ICD-10-CM | POA: Diagnosis not present

## 2024-07-23 DIAGNOSIS — M65812 Other synovitis and tenosynovitis, left shoulder: Secondary | ICD-10-CM | POA: Diagnosis not present

## 2024-07-23 DIAGNOSIS — M7522 Bicipital tendinitis, left shoulder: Secondary | ICD-10-CM | POA: Diagnosis not present

## 2024-07-23 DIAGNOSIS — S46212A Strain of muscle, fascia and tendon of other parts of biceps, left arm, initial encounter: Secondary | ICD-10-CM | POA: Diagnosis not present

## 2024-08-13 DIAGNOSIS — R29898 Other symptoms and signs involving the musculoskeletal system: Secondary | ICD-10-CM | POA: Diagnosis not present

## 2024-08-13 DIAGNOSIS — M25612 Stiffness of left shoulder, not elsewhere classified: Secondary | ICD-10-CM | POA: Diagnosis not present

## 2024-08-13 DIAGNOSIS — M25512 Pain in left shoulder: Secondary | ICD-10-CM | POA: Diagnosis not present

## 2024-08-17 DIAGNOSIS — R29898 Other symptoms and signs involving the musculoskeletal system: Secondary | ICD-10-CM | POA: Diagnosis not present

## 2024-08-17 DIAGNOSIS — M25612 Stiffness of left shoulder, not elsewhere classified: Secondary | ICD-10-CM | POA: Diagnosis not present

## 2024-08-17 DIAGNOSIS — M25512 Pain in left shoulder: Secondary | ICD-10-CM | POA: Diagnosis not present

## 2024-08-20 DIAGNOSIS — M25512 Pain in left shoulder: Secondary | ICD-10-CM | POA: Diagnosis not present

## 2024-08-20 DIAGNOSIS — M25612 Stiffness of left shoulder, not elsewhere classified: Secondary | ICD-10-CM | POA: Diagnosis not present

## 2024-08-20 DIAGNOSIS — R29898 Other symptoms and signs involving the musculoskeletal system: Secondary | ICD-10-CM | POA: Diagnosis not present

## 2024-08-27 DIAGNOSIS — M25612 Stiffness of left shoulder, not elsewhere classified: Secondary | ICD-10-CM | POA: Diagnosis not present

## 2024-08-27 DIAGNOSIS — R29898 Other symptoms and signs involving the musculoskeletal system: Secondary | ICD-10-CM | POA: Diagnosis not present

## 2024-08-27 DIAGNOSIS — M25512 Pain in left shoulder: Secondary | ICD-10-CM | POA: Diagnosis not present

## 2024-08-31 DIAGNOSIS — M25612 Stiffness of left shoulder, not elsewhere classified: Secondary | ICD-10-CM | POA: Diagnosis not present

## 2024-08-31 DIAGNOSIS — M25512 Pain in left shoulder: Secondary | ICD-10-CM | POA: Diagnosis not present

## 2024-08-31 DIAGNOSIS — R29898 Other symptoms and signs involving the musculoskeletal system: Secondary | ICD-10-CM | POA: Diagnosis not present

## 2024-09-03 DIAGNOSIS — M25612 Stiffness of left shoulder, not elsewhere classified: Secondary | ICD-10-CM | POA: Diagnosis not present

## 2024-09-03 DIAGNOSIS — M25512 Pain in left shoulder: Secondary | ICD-10-CM | POA: Diagnosis not present

## 2024-09-03 DIAGNOSIS — R29898 Other symptoms and signs involving the musculoskeletal system: Secondary | ICD-10-CM | POA: Diagnosis not present

## 2024-09-07 DIAGNOSIS — M25512 Pain in left shoulder: Secondary | ICD-10-CM | POA: Diagnosis not present

## 2024-09-07 DIAGNOSIS — R29898 Other symptoms and signs involving the musculoskeletal system: Secondary | ICD-10-CM | POA: Diagnosis not present

## 2024-09-07 DIAGNOSIS — M25612 Stiffness of left shoulder, not elsewhere classified: Secondary | ICD-10-CM | POA: Diagnosis not present

## 2024-09-10 DIAGNOSIS — R29898 Other symptoms and signs involving the musculoskeletal system: Secondary | ICD-10-CM | POA: Diagnosis not present

## 2024-09-10 DIAGNOSIS — M25612 Stiffness of left shoulder, not elsewhere classified: Secondary | ICD-10-CM | POA: Diagnosis not present

## 2024-09-10 DIAGNOSIS — M25512 Pain in left shoulder: Secondary | ICD-10-CM | POA: Diagnosis not present

## 2024-10-12 ENCOUNTER — Ambulatory Visit: Admitting: Family Medicine

## 2024-11-09 ENCOUNTER — Ambulatory Visit: Admitting: Family Medicine
